# Patient Record
Sex: Female | Born: 1970
Health system: Southern US, Community
[De-identification: ages and names within clinical notes are randomized; demographics above are authoritative.]

## PROBLEM LIST (undated history)

## (undated) DIAGNOSIS — R87629 Unspecified abnormal cytological findings in specimens from vagina: Secondary | ICD-10-CM

## (undated) DIAGNOSIS — G40909 Epilepsy, unspecified, not intractable, without status epilepticus: Secondary | ICD-10-CM

## (undated) DIAGNOSIS — L0390 Cellulitis, unspecified: Secondary | ICD-10-CM

## (undated) DIAGNOSIS — F419 Anxiety disorder, unspecified: Secondary | ICD-10-CM

## (undated) DIAGNOSIS — F17200 Nicotine dependence, unspecified, uncomplicated: Secondary | ICD-10-CM

## (undated) DIAGNOSIS — Z720 Tobacco use: Secondary | ICD-10-CM

## (undated) DIAGNOSIS — IMO0002 Reserved for concepts with insufficient information to code with codable children: Secondary | ICD-10-CM

## (undated) DIAGNOSIS — O039 Complete or unspecified spontaneous abortion without complication: Secondary | ICD-10-CM

## (undated) DIAGNOSIS — J45909 Unspecified asthma, uncomplicated: Secondary | ICD-10-CM

## (undated) DIAGNOSIS — Z319 Encounter for procreative management, unspecified: Secondary | ICD-10-CM

## (undated) DIAGNOSIS — K5792 Diverticulitis of intestine, part unspecified, without perforation or abscess without bleeding: Secondary | ICD-10-CM

## (undated) DIAGNOSIS — Z8742 Personal history of other diseases of the female genital tract: Secondary | ICD-10-CM

## (undated) HISTORY — PX: CHOLECYSTECTOMY: SHX55

## (undated) HISTORY — PX: COLPOSCOPY: SHX161

## (undated) HISTORY — DX: Diverticulitis of intestine, part unspecified, without perforation or abscess without bleeding: K57.92

## (undated) HISTORY — DX: Tobacco use: Z72.0

## (undated) HISTORY — DX: Nicotine dependence, unspecified, uncomplicated: F17.200

## (undated) HISTORY — DX: Epilepsy, unspecified, not intractable, without status epilepticus: G40.909

## (undated) HISTORY — DX: Cellulitis, unspecified: L03.90

## (undated) HISTORY — DX: Reserved for concepts with insufficient information to code with codable children: IMO0002

## (undated) HISTORY — DX: Encounter for procreative management, unspecified: Z31.9

## (undated) HISTORY — DX: Complete or unspecified spontaneous abortion without complication: O03.9

## (undated) HISTORY — DX: Personal history of other diseases of the female genital tract: Z87.42

## (undated) HISTORY — DX: Unspecified abnormal cytological findings in specimens from vagina: R87.629

## (undated) HISTORY — DX: Anxiety disorder, unspecified: F41.9

## (undated) HISTORY — PX: BUNIONECTOMY: SHX129

## (undated) HISTORY — PX: TONSILLECTOMY AND ADENOIDECTOMY: SUR1326

---

## 1995-05-24 DIAGNOSIS — IMO0002 Reserved for concepts with insufficient information to code with codable children: Secondary | ICD-10-CM

## 1995-05-24 HISTORY — DX: Reserved for concepts with insufficient information to code with codable children: IMO0002

## 1997-09-15 ENCOUNTER — Other Ambulatory Visit: Admission: RE | Admit: 1997-09-15 | Discharge: 1997-09-15 | Payer: Self-pay | Admitting: Obstetrics and Gynecology

## 1998-01-06 ENCOUNTER — Other Ambulatory Visit: Admission: RE | Admit: 1998-01-06 | Discharge: 1998-01-06 | Payer: Self-pay | Admitting: Obstetrics and Gynecology

## 1998-05-23 HISTORY — PX: REFRACTIVE SURGERY: SHX103

## 1998-06-23 ENCOUNTER — Other Ambulatory Visit: Admission: RE | Admit: 1998-06-23 | Discharge: 1998-06-23 | Payer: Self-pay | Admitting: Obstetrics and Gynecology

## 1999-02-05 ENCOUNTER — Other Ambulatory Visit: Admission: RE | Admit: 1999-02-05 | Discharge: 1999-02-05 | Payer: Self-pay | Admitting: Gynecology

## 2000-07-18 ENCOUNTER — Other Ambulatory Visit: Admission: RE | Admit: 2000-07-18 | Discharge: 2000-07-18 | Payer: Self-pay | Admitting: Gynecology

## 2001-03-14 ENCOUNTER — Inpatient Hospital Stay (HOSPITAL_COMMUNITY): Admission: AD | Admit: 2001-03-14 | Discharge: 2001-03-14 | Payer: Self-pay | Admitting: Gynecology

## 2001-04-15 ENCOUNTER — Observation Stay (HOSPITAL_COMMUNITY): Admission: AD | Admit: 2001-04-15 | Discharge: 2001-04-16 | Payer: Self-pay | Admitting: *Deleted

## 2001-04-20 ENCOUNTER — Encounter (INDEPENDENT_AMBULATORY_CARE_PROVIDER_SITE_OTHER): Payer: Self-pay | Admitting: Specialist

## 2001-04-20 ENCOUNTER — Inpatient Hospital Stay (HOSPITAL_COMMUNITY): Admission: AD | Admit: 2001-04-20 | Discharge: 2001-04-23 | Payer: Self-pay | Admitting: Gynecology

## 2001-04-22 DIAGNOSIS — L0390 Cellulitis, unspecified: Secondary | ICD-10-CM

## 2001-04-22 HISTORY — DX: Cellulitis, unspecified: L03.90

## 2001-04-24 ENCOUNTER — Encounter: Admission: RE | Admit: 2001-04-24 | Discharge: 2001-05-24 | Payer: Self-pay | Admitting: Gynecology

## 2001-04-28 ENCOUNTER — Inpatient Hospital Stay (HOSPITAL_COMMUNITY): Admission: EM | Admit: 2001-04-28 | Discharge: 2001-04-30 | Payer: Self-pay | Admitting: Emergency Medicine

## 2001-05-01 ENCOUNTER — Emergency Department (HOSPITAL_COMMUNITY): Admission: EM | Admit: 2001-05-01 | Discharge: 2001-05-01 | Payer: Self-pay | Admitting: Emergency Medicine

## 2001-05-02 ENCOUNTER — Emergency Department (HOSPITAL_COMMUNITY): Admission: EM | Admit: 2001-05-02 | Discharge: 2001-05-02 | Payer: Self-pay | Admitting: Emergency Medicine

## 2001-05-28 ENCOUNTER — Other Ambulatory Visit: Admission: RE | Admit: 2001-05-28 | Discharge: 2001-05-28 | Payer: Self-pay | Admitting: Gynecology

## 2001-07-30 ENCOUNTER — Encounter: Payer: Self-pay | Admitting: Family Medicine

## 2001-07-30 ENCOUNTER — Ambulatory Visit (HOSPITAL_COMMUNITY): Admission: RE | Admit: 2001-07-30 | Discharge: 2001-07-30 | Payer: Self-pay | Admitting: Family Medicine

## 2002-07-22 HISTORY — PX: CERVICAL BIOPSY  W/ LOOP ELECTRODE EXCISION: SUR135

## 2002-07-23 ENCOUNTER — Other Ambulatory Visit: Admission: RE | Admit: 2002-07-23 | Discharge: 2002-07-23 | Payer: Self-pay | Admitting: Gynecology

## 2002-12-04 ENCOUNTER — Other Ambulatory Visit: Admission: RE | Admit: 2002-12-04 | Discharge: 2002-12-04 | Payer: Self-pay | Admitting: Gynecology

## 2002-12-05 ENCOUNTER — Encounter: Payer: Self-pay | Admitting: Internal Medicine

## 2002-12-05 ENCOUNTER — Ambulatory Visit (HOSPITAL_COMMUNITY): Admission: RE | Admit: 2002-12-05 | Discharge: 2002-12-05 | Payer: Self-pay | Admitting: Internal Medicine

## 2002-12-11 ENCOUNTER — Ambulatory Visit (HOSPITAL_COMMUNITY): Admission: RE | Admit: 2002-12-11 | Discharge: 2002-12-11 | Payer: Self-pay | Admitting: Orthopedic Surgery

## 2002-12-11 ENCOUNTER — Encounter: Payer: Self-pay | Admitting: Orthopedic Surgery

## 2003-03-20 ENCOUNTER — Other Ambulatory Visit: Admission: RE | Admit: 2003-03-20 | Discharge: 2003-03-20 | Payer: Self-pay | Admitting: Gynecology

## 2003-06-16 ENCOUNTER — Other Ambulatory Visit: Admission: RE | Admit: 2003-06-16 | Discharge: 2003-06-16 | Payer: Self-pay | Admitting: Gynecology

## 2003-07-07 ENCOUNTER — Ambulatory Visit (HOSPITAL_COMMUNITY): Admission: RE | Admit: 2003-07-07 | Discharge: 2003-07-07 | Payer: Self-pay | Admitting: Family Medicine

## 2003-09-04 ENCOUNTER — Other Ambulatory Visit: Admission: RE | Admit: 2003-09-04 | Discharge: 2003-09-04 | Payer: Self-pay | Admitting: Gynecology

## 2003-12-02 ENCOUNTER — Ambulatory Visit (HOSPITAL_COMMUNITY): Admission: RE | Admit: 2003-12-02 | Discharge: 2003-12-02 | Payer: Self-pay | Admitting: Urology

## 2004-01-01 ENCOUNTER — Ambulatory Visit (HOSPITAL_COMMUNITY): Admission: RE | Admit: 2004-01-01 | Discharge: 2004-01-01 | Payer: Self-pay | Admitting: Family Medicine

## 2004-05-25 ENCOUNTER — Other Ambulatory Visit: Admission: RE | Admit: 2004-05-25 | Discharge: 2004-05-25 | Payer: Self-pay | Admitting: Gynecology

## 2004-09-07 ENCOUNTER — Other Ambulatory Visit: Admission: RE | Admit: 2004-09-07 | Discharge: 2004-09-07 | Payer: Self-pay | Admitting: Gynecology

## 2004-10-06 ENCOUNTER — Ambulatory Visit: Payer: Self-pay | Admitting: Orthopedic Surgery

## 2004-11-25 ENCOUNTER — Ambulatory Visit: Payer: Self-pay | Admitting: Orthopedic Surgery

## 2005-08-01 ENCOUNTER — Ambulatory Visit: Payer: Self-pay | Admitting: Orthopedic Surgery

## 2005-09-09 ENCOUNTER — Other Ambulatory Visit: Admission: RE | Admit: 2005-09-09 | Discharge: 2005-09-09 | Payer: Self-pay | Admitting: Gynecology

## 2006-05-24 ENCOUNTER — Ambulatory Visit (HOSPITAL_COMMUNITY): Admission: RE | Admit: 2006-05-24 | Discharge: 2006-05-24 | Payer: Self-pay | Admitting: Family Medicine

## 2006-09-11 ENCOUNTER — Other Ambulatory Visit: Admission: RE | Admit: 2006-09-11 | Discharge: 2006-09-11 | Payer: Self-pay | Admitting: Gynecology

## 2007-08-03 ENCOUNTER — Ambulatory Visit: Payer: Self-pay | Admitting: Internal Medicine

## 2007-08-09 ENCOUNTER — Ambulatory Visit (HOSPITAL_COMMUNITY): Admission: RE | Admit: 2007-08-09 | Discharge: 2007-08-09 | Payer: Self-pay | Admitting: Internal Medicine

## 2007-09-14 ENCOUNTER — Other Ambulatory Visit: Admission: RE | Admit: 2007-09-14 | Discharge: 2007-09-14 | Payer: Self-pay | Admitting: Gynecology

## 2007-10-15 ENCOUNTER — Emergency Department (HOSPITAL_COMMUNITY): Admission: EM | Admit: 2007-10-15 | Discharge: 2007-10-15 | Payer: Self-pay | Admitting: Family Medicine

## 2008-03-21 ENCOUNTER — Ambulatory Visit: Payer: Self-pay | Admitting: Women's Health

## 2008-03-21 ENCOUNTER — Other Ambulatory Visit: Admission: RE | Admit: 2008-03-21 | Discharge: 2008-03-21 | Payer: Self-pay | Admitting: Gynecology

## 2008-03-21 ENCOUNTER — Encounter: Payer: Self-pay | Admitting: Women's Health

## 2008-03-26 ENCOUNTER — Ambulatory Visit (HOSPITAL_COMMUNITY): Admission: RE | Admit: 2008-03-26 | Discharge: 2008-03-26 | Payer: Self-pay | Admitting: Gynecology

## 2008-08-28 ENCOUNTER — Ambulatory Visit: Payer: Self-pay | Admitting: Gynecology

## 2008-09-15 ENCOUNTER — Encounter: Payer: Self-pay | Admitting: Women's Health

## 2008-09-15 ENCOUNTER — Ambulatory Visit: Payer: Self-pay | Admitting: Women's Health

## 2008-09-15 ENCOUNTER — Other Ambulatory Visit: Admission: RE | Admit: 2008-09-15 | Discharge: 2008-09-15 | Payer: Self-pay | Admitting: Gynecology

## 2008-09-19 ENCOUNTER — Ambulatory Visit: Payer: Self-pay | Admitting: Women's Health

## 2009-04-08 ENCOUNTER — Ambulatory Visit: Payer: Self-pay | Admitting: Women's Health

## 2009-09-21 ENCOUNTER — Ambulatory Visit: Payer: Self-pay | Admitting: Women's Health

## 2009-09-21 ENCOUNTER — Other Ambulatory Visit: Admission: RE | Admit: 2009-09-21 | Discharge: 2009-09-21 | Payer: Self-pay | Admitting: Gynecology

## 2009-10-22 ENCOUNTER — Ambulatory Visit: Payer: Self-pay | Admitting: Women's Health

## 2010-10-05 NOTE — H&P (Signed)
NAME:  Debbie Young, Debbie Young                 ACCOUNT NO.:  [id number]   MEDICAL RECORD NO.:  [id number]          PATIENT TYPE:  AMB   LOCATION:  DAY                           FACILITY:  [facility]   PHYSICIAN:  R. Roetta Sessions, M.D. DATE OF BIRTH:  06-22-1970   DATE OF ADMISSION:  DATE OF DISCHARGE:  LH                              HISTORY & PHYSICAL   CHIEF COMPLAINT:  I have a spot on my liver.   PRIMARY CARE PHYSICIAN:  Corrie Mckusick, M.D.   HISTORY OF PRESENT ILLNESS:  Debbie Young is a 40 year old lady who presents  today to follow up on a spot on the liver which she has known about  dating back to 2005.  She states she saw a urologist because of  microhematuria.  She had a renal ultrasound in their office and  apparently they picked up on a liver lesion.  Because of this, she  underwent a CT of abdomen and pelvis.  This was back on Sep 29, 2003.  She had a 1-cm low-density lesion in the right liver that was felt not  to be a cyst.  MRI was recommended.  She had an MRI of the abdomen December 02, 2003, which revealed two small hemangiomas of the right lobe of the  liver.  The better-defined, slightly larger one correlated with the  abnormality seen on prior CT.  One was 12 mm in the posterior lateral  aspect of the right lobe of the liver and the other 9 mm in the  midportion of the right lobe.  It was felt that no further workup was  needed.  She states, however, her urologist recommend that she have one  more study done later, but she fell to follow up.  In addition, in  August 2005 she had a CT here at [facility]. which showed these  two small lesions measuring 13 x 8 and 12 x 9 mm, corresponding to the  tiny hepatic hemangiomas previously seen on MRI.  She denies any  abdominal pain.  She often does have some postprandial loose stools when  she is not working.  Whenever she is on the job, she feels constipated  and bloated.  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  She denies any blood in the stool.  No nausea or  vomiting or heartburn.  No unintentional weight loss.   CURRENT MEDICATIONS:  1. Ibuprofen p.r.n.  2. NuvaRing.   ALLERGIES:  PREDNISONE.   PAST MEDICAL HISTORY:  1. History of recurrent sinusitis three or four times a year,      sometimes with bronchitis.  2. She had a juvenile polyp at age 73 found due to rectal bleeding.  3. She has had two bunionectomies, tonsillectomy, cholecystectomy and      cesarean section.   FAMILY HISTORY:  Her mother has some sort of platelet deficiency.  They  also feel she has von Willebrand disease, although according to the  patient it is not entirely confirmed.  Father has myelodysplasia  followed by [facility] for over 10 years.   SOCIAL HISTORY:  She is single.  She a 40-year-old.  She is employed with  [employer] as a Engineer, petroleum and has worked for 9 years  there.  She smokes less than a pack a day.  Denies frequent alcohol use,  usually drinks only socially.   REVIEW OF SYSTEMS:  GI:  See HPI.  CONSTITUTIONAL:  No unintentional  weight loss.  CARDIOPULMONARY:  No chest pain, shortness of breath.  GENITOURINARY:  No dysuria or hematuria.  Irregular menses.   PHYSICAL EXAM:  Weight 146, height 5 feet 4 inches, temperature 98,  blood pressure 110/68, pulse 56.  GENERAL:  A pleasant, well-nourished, well-developed Caucasian female in  no acute distress.  SKIN:  Warm and dry.  No jaundice.  HEENT: Sclerae are nonicteric.  Oropharyngeal mucosa moist and pink.  No  lesions, erythema or exudate.  No lymphadenopathy or thyromegaly.  CHEST:  Lungs clear auscultation.  CARDIAC:  Regular rate and rhythm.  Normal S1 and S2.  No murmurs, rubs  or gallops.  ABDOMEN: Positive bowel sounds.  Abdomen is soft, nontender,  nondistended.  No organomegaly or masses.  No rebound or guarding.  No  abdominal bruits or  hernias.  LOWER EXTREMITIES:  No edema.   IMPRESSION:  Debbie Young is a 40-year lady who presents for further follow-up  of known two hepatic hemangiomas.  She is quite concerned about having  one final follow-up study as it has been four years just to ensure that  there is no significant increase in size.  This is reasonable given that  her other studies were followed fairly close to each other.  In  addition, she has some postprandial loose stools with vague abdominal  bloating, most likely due to irritable bowel syndrome but will further  evaluate at time of CT as well.  CT of the abdomen and pelvis with IV  and oral contrast.  These lesions were seen on previous study.  I am  sure that they can be seen and documented as far as size; however, I  will discuss with radiology to see whether or not they feel this would  be an adequate study or whether she needs to actually have an MRI.  1. A trial of Align one daily.  Samples provided.      Tana Coast, P.AJonathon Bellows, M.D.  Electronically Signed    LL/MEDQ  D:  08/03/2007  T:  08/04/2007  Job:  [id number]   cc:   Corrie Mckusick, M.D.  Fax: [phone]

## 2010-10-08 NOTE — Consult Note (Signed)
[facility]  Patient:    Debbie Young, Debbie Young Visit Number: [id number] MRN: [id number]          Service Type: MED Location: 3W 0366 01 Attending Physician:  Anastasio Auerbach Dictated by:   Nadyne Coombes. Fontaine, M.D. Consultation Date: 04/28/01 Admit Date:  04/28/2001 Discharge Date: 04/30/2001                            Consultation Report  CHIEF COMPLAINT:              Swollen left foot.  HISTORY OF PRESENT ILLNESS:   A 40 year old female, status post cesarean section approximately eight days prior to evaluation, who noticed the morning of evaluation, a redness and swelling on her left foot.  This has progressively worsened throughout the day and she presents for evaluation. The patient was seen at a local physicians office in [place] and apparently was given an IM shot of Keflex and was sent to the hospital to rule out DVT.  Patient denies any injury to the extremity, any cuts, bug bites or other trauma.  Patient is, otherwise, having uneventful recovery from her cesarean section.  PAST SURGICAL HISTORY:        Significant for polypectomy, laser eye surgery, cesarean section for breech, bunionectomy, tonsilloadenoidectomy and cholecystectomy.  PAST MEDICAL HISTORY:         Unremarkable.  ALLERGIES:                    No known drug allergies.  CURRENT MEDICATIONS:          1. Tylox p.r.n. for pain.                               2. Recent Keflex administration today.  SOCIAL HISTORY:               Noncontributory.  FAMILY HISTORY:               Noncontributory.  REVIEW OF SYSTEMS:            Noncontributory.  PHYSICAL EXAMINATION:  VITAL SIGNS:                  Temperature 100.6 degrees.  Vital signs are stable.  HEENT:                        Normal.  LUNGS:                        Clear.  CARDIOVASCULAR:               Regular rate.  No rubs, murmurs or gallops.  ABDOMEN:                      Exam benign.  Incision healing  nicely.  EXTREMITIES:                  Marked swelling of the left foot with erythematous changes, warm to the touch.  There is no calf or thigh tenderness.  No swelling above the ankle.  No palpable cords or other abnormalities.  ASSESSMENT:                   Probable cellulitis, rule out deep vein thrombosis.  Although, given the marked erythema and heat with  discomfort, certainly all points towards an infectious etiology.  Cause is unknown as there are no skin breaks or other overt etiologies for this cellulitis.  I discussed the case with Dr. Beverely Pace at [facility] emergency room and reviewed the situation with him.  I do not think this related to her cesarean section or having any gynecologic or obstetric etiology.  Given the rapid progression of the apparent cellulitis, I think that she needs to be evaluated and considered for IV antibiotic therapy and certainly the potential for rule out DVT studies and I have asked Dr. Beverely Pace to evaluate her at [facility] emergency room and triage per his evaluation and he agrees to accept her and we will transport her there for evaluation. Dictated by:   Nadyne Coombes. Fontaine, M.D. Attending Physician:  Anastasio Auerbach DD:  04/29/01 TD:  04/29/01 Job: [id number]

## 2010-10-08 NOTE — H&P (Signed)
[facility]  Patient:    Debbie Young, Debbie Young Visit Number: [id number] MRN: [id number]          Service Type: OBS Location: 910A 9130 01 Attending Physician:  Tonye Royalty Dictated by:   Gaetano Hawthorne. Lily Peer, M.D. Admit Date:  04/20/2001                           History and Physical  CHIEF COMPLAINT:              Leakage of amniotic fluid per vagina.  HISTORY OF PRESENT ILLNESS:   The patient is a 40 year old, gravida 1, para 0, with corrected date of estimated confinement May 16, 2001.  Currently 36-2/[redacted] weeks gestation with known breach presentation, has been on oral tocolytics consisting of terbutaline 5 mg p.o. q.4h. secondary to preterm labor which was diagnosed one week ago requiring overnight hospitalization. The patients last terbutaline was approximately 1600 hours today, and she presented to [facility] today complaining of feeling wet with leakage of fluid per vagina since early this morning.  When she arrived, she was examined with vital signs as follows. Temperature 98.6, blood pressure 112/69, pulse 117, respirations 20.  Nitrazine and ferning were negative.  Her cervix was found to be 4 cm dilated, 80% effaced.  Bedside ultrasound confirmed frank breach presentation.  Fetal heart rate tracing was reassuring at 150 beats per minute and very mild and infrequent contractions, mostly irritability.  Based on findings of advanced cervical dilatation with breach presentation, it was decided to proceed at this point with primary cesarean section due to patient discomfort with lower abdominal pressure which she had been having all day.  PAST MEDICAL HISTORY:         Preterm labor diagnosed one week ago on terbutaline 5 mg p.o. q.4h., last dose 1600 hours.  She has a history of asthma and bronchitis, last episode back in 1996.  ALLERGIES:                    The patient denies any allergies.  PAST SURGICAL HISTORY:         Tonsillectomy and adenoidectomy.  She has had a polyp removed in 1980, two bunionectomies, cholecystectomy in 1996, refractive surgery in 2000.  REVIEW OF SYSTEMS:            See hospital form.  PHYSICAL EXAMINATION:  VITAL SIGNS:                  Blood pressure 112/69, temperature 98.6, pulse 117, respirations 20.  HEENT:                        Unremarkable.  NECK:                         Supple. Trachea midline.  No carotid bruits.  No thyromegaly.  LUNGS:                        Clear to auscultation without rhonchi or wheezes.  HEART:                        Regular rate and rhythm, no murmurs or gallops.  BREASTS:                      Exam not done.  ABDOMEN:                      Gravid uterus, breach presentation by [facility] maneuver as confirmed also by bedside ultrasound.  Positive fetal heart tones were noted.  PELVIC:                       Cervix 4 cm dilated, 80% effaced, breach presentation.  Questionable rupture of membranes although mostly subjective and not objective.  EXTREMITIES:                  DTRs 1+.  Negative clonus.  Trace edema.  PRENATAL LABORATORY DATA:     A positive blood type, negative antibody screen. VDRL was nonreactive.  Rubella immune.  Hepatitis B surface antigen and HIV were negative.  Alpha-fetoprotein was normal.  The patient had GBS culture done last week, results pending at the time of this dictation.  Pap smear was normal.  ASSESSMENT:                   A 40 year old gravida 1, para 0, at 37-2/[redacted] weeks gestation with advanced cervical dilatation at 4 cm, 80% effaced, -3 station with questionable rupture of membranes subjectively but not objectively.  The patient has breach presentation was scheduled for cesarean section in December.  Due to advanced cervical dilatation, lower abdominal pressure, and pelvic pressure she has complained of, and being in the breach presentation, it was decided to proceed at this point at 36-2/[redacted] weeks  gestation with primary cesarean section.  Of note, the patients blood sugar screens were reported to be normal.  Risks, benefits, and procedure were discussed with the patient including infection, bleeding, trauma to internal organs, trauma to the fetus, in the event of uncontrollable hemorrhage, potential risk for blood transfusion with potential risk such as anaphylactic reaction, hepatitis, and HIV were discussed.  All questions were answered and will follow accordingly.  PLAN:                         The patient will be prepared for emergency cesarean section. Dictated by:   Gaetano Hawthorne Lily Peer, M.D. Attending Physician:  Tonye Royalty DD:  04/20/01 TD:  04/20/01 Job: [id number]

## 2010-10-08 NOTE — Op Note (Signed)
[facility]  Patient:    Debbie Young, Debbie Young Visit Number: [id number] MRN: [id number]          Service Type: OBS Location: 910A 9130 01 Attending Physician:  Tonye Royalty Dictated by:   Gaetano Hawthorne. Lily Peer, M.D. Proc. Date: 04/20/01 Admit Date:  04/20/2001                             Operative Report  SURGEON:                      Juan H. Lily Peer, M.D.  INDICATION FOR OPERATION:     A 40 year old, gravida 1, para 0, at 36-2/[redacted] weeks gestation with advanced cervical dilation, 4 cm, 80% effaced, -2 to -3 station; subjective ruptured membranes; objectively, nondetected by fern or Nitrazine. History of preterm labor on terbutaline 5.0 mg q.4-6h.  PREOPERATIVE DIAGNOSES:       1. Preterm, 36-2/7 weeks estimated gestational                                  age.                               2. Homero Fellers breech presentation.                               3. Advanced cervical dilatation.                               4. Questionable ruptured membranes.  POSTOPERATIVE DIAGNOSES:      1. Preterm, 36-2/7 weeks estimated gestational                                  age.                               2. Homero Fellers breech presentation.                               3. Advanced cervical dilatation.                               4. Questionable ruptured membranes.                               5. Nuchal cord.  ANESTHESIA:                   Spinal.  PROCEDURE:                    Primary lower uterine segment transverse                               cesarean section.  ESTIMATED BLOOD LOSS:         800 cc.  FINDINGS:  Viable female infant with Apgars of 8 and 9 in the frank breech presentation; clear amniotic fluid; weight 7 pounds. Arterial cord pH of 7.30. Nuchal cord x 1.  DESCRIPTION OF PROCEDURE:     After the patient was adequately counseled and after confirming on bedside ultrasound that the fetus indeed was in the  breech presentation, we proceeded on with an emergency primary cesarean section. The patient had been counseled as to the risk, benefits, pros and cons, of such procedure. After the spinal was achieved, the patient was in the supine position. The abdomen was prepped and draped in the usual sterile fashion. A Foley catheter was inserted in an effort to monitor urinary output. A Pfannenstiel skin incision was made 2 cm above the symphysis pubis. The incision was carried down from the skin, subcutaneous tissue, down through the rectus fascia. The rectus fascia was incised in a transverse fashion. The midline raphe was entered. The peritoneal cavity was entered cautiously. The bladder flap was established. The lower uterine segment was incised in a transverse fashion. Clear amniotic fluid was present. The fetus was delivered in the frank breech presentation, the lower extremities, followed by the upper extremities. A nuchal cord was evident which was manually reduced. After the newborn was delivered, the nasopharyngeal area was bulb suctioned. The cord was doubly clamped and excised and the infant passed off to the pediatricians who were in attendance. The baby gave an immediate cry. After cord blood was obtained, the placenta was delivered from the intrauterine cavity. The uterus was then exteriorized. The intrauterine cavity was felt clear of remaining products of conception. The lower uterine segment was closed in a single layer fashion interlocking stitch manner with 0 Vicryl suture. The tubes and ovaries were normal. The uterus was placed back into the abdominal cavity. The pelvic cavity was copiously irrigated with normal saline solution. After ascertaining adequate hemostasis, the visceral peritoneum was not approximated but the rectus fascia was closed with a running stitch of 0 Vicryl suture. The subcutaneous bleeders were Bovie cauterized. The skin was reapproximated with skin clips  followed by Xeroform gauze and 4 x 4 dressing. The patient was transferred to recovery room with stable vital signs. Blood loss for the procedure was reported at 800 cc. IV fluids 2000 cc lactated Ringers. Urine output 50 cc. The patient received a gram of Cefotan IV, and no real complications. Dictated by:   Gaetano Hawthorne Lily Peer, M.D. Attending Physician:  Tonye Royalty DD:  04/20/01 TD:  04/21/01 Job: [id number]

## 2010-10-08 NOTE — Discharge Summary (Signed)
[facility]  Patient:    Debbie Young, Debbie Young Visit Number: [id number] MRN: [id number]          Service Type: EMS Location: ED Attending Physician:  Annamarie Dawley Dictated by:   Antony Contras, [facility] Admit Date:  05/02/2001 Discharge Date: 05/02/2001                             Discharge Summary  DISCHARGE DIAGNOSES:          1. Preterm, 36-2/[redacted] weeks gestational age.                               2. Homero Fellers breech presentation.                               3. Advanced cervical dilatation.                               4. Questionable rupture of membranes.                               5. Nuchal cord.  HISTORY OF PRESENT ILLNESS:   The patient is a 40 year old primigravida with an LMP of July 04, 2000, [facility] May 16, 2001 by sonogram.  Prenatal course was complicated by preterm labor requiring administration of terbutaline 5 mg p.o. q.4h. at approximately [redacted] weeks gestation.  LABORATORY/ACCESSORY DATA:    Blood type A-positive.  Antibody screen negative.  RPR, HBsAg, HIV nonreactive.  MSAFP normal.  HOSPITAL COURSE/TREATMENT:    The patient presented on April 20, 2001 with advanced cervical dilatation of 4 cm, 80% effaced, -2 station, subjective rupture of membranes, breech presentation.  It was decided to proceed with cesarean section.  This was performed by Dr. Lily Peer under spinal anesthesia.  Findings included a viable female infant, Apgars 8/9, weight 7 pounds, clear amniotic fluid.  Infant frank breech presentation.  Arterial cord pH 7.30, nuchal cord x1.  POSTOPERATIVE COURSE:         The patient remained afebrile, had no difficulty voiding, was able to be discharged in satisfactory condition on her third postoperative day.  CBC: Hematocrit 26.9, hemoglobin 9.3, WBC 10.7, platelets 223,000.  DISPOSITION:                  Follow up in six weeks.  Continue prenatal vitamins and iron.  Motrin, Tylox for pain. Dictated by:   Antony Contras, [facility] Attending Physician:  Annamarie Dawley DD:  06/01/01 TD:  06/02/01 Job: [id number]

## 2011-02-21 DIAGNOSIS — G40909 Epilepsy, unspecified, not intractable, without status epilepticus: Secondary | ICD-10-CM | POA: Insufficient documentation

## 2011-02-21 DIAGNOSIS — IMO0002 Reserved for concepts with insufficient information to code with codable children: Secondary | ICD-10-CM | POA: Insufficient documentation

## 2011-02-21 DIAGNOSIS — L0390 Cellulitis, unspecified: Secondary | ICD-10-CM | POA: Insufficient documentation

## 2011-02-21 DIAGNOSIS — F17200 Nicotine dependence, unspecified, uncomplicated: Secondary | ICD-10-CM | POA: Insufficient documentation

## 2011-02-21 DIAGNOSIS — R319 Hematuria, unspecified: Secondary | ICD-10-CM | POA: Insufficient documentation

## 2011-02-23 ENCOUNTER — Encounter: Payer: Self-pay | Admitting: Women's Health

## 2011-02-23 ENCOUNTER — Ambulatory Visit (INDEPENDENT_AMBULATORY_CARE_PROVIDER_SITE_OTHER): Payer: 59 | Admitting: Women's Health

## 2011-02-23 ENCOUNTER — Other Ambulatory Visit (HOSPITAL_COMMUNITY)
Admission: RE | Admit: 2011-02-23 | Discharge: 2011-02-23 | Disposition: A | Discharge status: Home / Self Care | Payer: 59 | Source: Ambulatory Visit | Attending: Gynecology | Admitting: Gynecology

## 2011-02-23 VITALS — BP 112/70 | Ht 64.0 in | Wt 178.0 lb

## 2011-02-23 DIAGNOSIS — Z13220 Encounter for screening for lipoid disorders: Secondary | ICD-10-CM

## 2011-02-23 DIAGNOSIS — Z01419 Encounter for gynecological examination (general) (routine) without abnormal findings: Secondary | ICD-10-CM

## 2011-02-23 DIAGNOSIS — R823 Hemoglobinuria: Secondary | ICD-10-CM

## 2011-02-23 NOTE — Progress Notes (Signed)
Mykela Stanford Breed 1970/12/22 [id number]    History:    The patient presents for annual exam.  Son Clifton Custard 9 doing well. Both parents with cancer father having chemo for Myeloma. Mother with breast cancer and doing better.   Past medical history, past surgical history, family history and social history were all reviewed and documented in the EPIC chart.   ROS:  A  ROS was performed and pertinent positives and negatives are included in the history.  Exam:  Filed Vitals:   02/23/11 0813  BP: 112/70    General appearance:  Normal Head/Neck:  Normal, without cervical or supraclavicular adenopathy. Thyroid:  Symmetrical, normal in size, without palpable masses or nodularity. Respiratory  Effort:  Normal  Auscultation:  Clear without wheezing or rhonchi Cardiovascular  Auscultation:  Regular rate, without rubs, murmurs or gallops  Edema/varicosities:  Not grossly evident Abdominal  Soft,nontender, without masses, guarding or rebound.  Liver/spleen:  No organomegaly noted  Hernia:  None appreciated  Skin  Inspection:  Grossly normal  Palpation:  Grossly normal Neurologic/psychiatric  Orientation:  Normal with appropriate conversation.  Mood/affect:  Normal  Genitourinary    Breasts: Examined lying and sitting.     Right: Without masses, retractions, discharge or axillary adenopathy.     Left: Without masses, retractions, discharge or axillary adenopathy.   Inguinal/mons:  Normal without inguinal adenopathy  External genitalia:  Normal  BUS/Urethra/Skene's glands:  Normal  Bladder:  Normal  Vagina:  Normal  Cervix:  Normal  Uterus:  normal in size, shape and contour.  Midline and mobile  Adnexa/parametria:     Rt: Without masses or tenderness.   Lt: Without masses or tenderness.  Anus and perineum: Normal  Digital rectal exam: Normal sphincter tone without palpated masses or tenderness  Assessment/Plan:  40 y.o.DWF G1P1  for annual exam monthly 4-5 day cycle, not sexually  active. No complaints.   Normal GYN exam History of HGSIL./CIN 2 in 04/LEEP/ normal Paps since.  Plan: SBEs, increase exercise, calcium rich diet, MVI daily encouraged. CBC, UA, lipid profile, and Pap. Contraception options reviewed she is down to 2-3 cigarettes per day and did review no combination OCs. She had problems with a Mirena IUD with spotting. Condoms if becomes active.   Harrington Challenger [facility], 8:42 AM 02/23/2011

## 2011-03-11 ENCOUNTER — Telehealth: Payer: Self-pay | Admitting: *Deleted

## 2011-03-11 NOTE — Telephone Encounter (Signed)
Telephone call, congratulated on quitting smoking for one week. Did review needs to be smoke-free a minimum of one month prior to starting back on birth control pills. States she liked being on pills to help with cycle control, lighter cycles. She is not sexually active. Did review risks of blood clots and strokes with smoking and being greater than 35.

## 2011-03-11 NOTE — Telephone Encounter (Signed)
Patient wanted to let you know that she has now completely stopped smoking.  Has been cigarette free for a week now.  Wants to get on oc's now that she has quit.  Was discussed at last visit on 02/23/11.  Please advise.

## 2011-04-07 ENCOUNTER — Telehealth: Payer: Self-pay

## 2011-04-07 MED ORDER — NORETHINDRONE ACET-ETHINYL EST 1-20 MG-MCG PO TABS: 1.0000 | ORAL_TABLET | Freq: Every day | ORAL | Status: DC

## 2011-04-07 NOTE — Telephone Encounter (Signed)
PT. NOTIFIED OF NANCY'S NOTE BELOW & SENT RX TO HER [facility]

## 2011-04-07 NOTE — Telephone Encounter (Signed)
Please call  congratulate for no smoking. Loestrin 1/20, one by mouth daily dispense one pack 11 refills generic. Instruct Debbie Young to start on first day of Debbie Young next cycle and to use condoms first month back on. Review with Debbie Young if she starts smoking again will need to stop pills due to risks of blood clots and strokes.(Was down to 2-3 cigarettes per day at annual exam)

## 2011-04-07 NOTE — Telephone Encounter (Signed)
PER PREVIOUS ENCOUNTERS IN EPIC PT STATES SHE HAS BEEN SMOKE FREE x 1 MONTH & WANTS TO START OCP'S AGAIN.

## 2012-02-26 ENCOUNTER — Other Ambulatory Visit: Payer: Self-pay | Admitting: Women's Health

## 2012-04-05 ENCOUNTER — Other Ambulatory Visit: Payer: Self-pay | Admitting: Women's Health

## 2012-04-11 ENCOUNTER — Encounter: Payer: Self-pay | Admitting: Women's Health

## 2012-04-11 ENCOUNTER — Ambulatory Visit (INDEPENDENT_AMBULATORY_CARE_PROVIDER_SITE_OTHER): Payer: 59 | Admitting: Women's Health

## 2012-04-11 VITALS — BP 116/80 | Ht 64.0 in | Wt 162.0 lb

## 2012-04-11 DIAGNOSIS — IMO0002 Reserved for concepts with insufficient information to code with codable children: Secondary | ICD-10-CM

## 2012-04-11 DIAGNOSIS — IMO0001 Reserved for inherently not codable concepts without codable children: Secondary | ICD-10-CM

## 2012-04-11 DIAGNOSIS — R6889 Other general symptoms and signs: Secondary | ICD-10-CM

## 2012-04-11 DIAGNOSIS — Z309 Encounter for contraceptive management, unspecified: Secondary | ICD-10-CM

## 2012-04-11 DIAGNOSIS — Z01419 Encounter for gynecological examination (general) (routine) without abnormal findings: Secondary | ICD-10-CM

## 2012-04-11 DIAGNOSIS — Z833 Family history of diabetes mellitus: Secondary | ICD-10-CM

## 2012-04-11 DIAGNOSIS — Z113 Encounter for screening for infections with a predominantly sexual mode of transmission: Secondary | ICD-10-CM

## 2012-04-11 DIAGNOSIS — F411 Generalized anxiety disorder: Secondary | ICD-10-CM

## 2012-04-11 DIAGNOSIS — F419 Anxiety disorder, unspecified: Secondary | ICD-10-CM

## 2012-04-11 LAB — CBC WITH DIFFERENTIAL/PLATELET
Basophils Relative: 1 % (ref 0–1)
Eosinophils Absolute: 0.1 10*3/uL (ref 0.0–0.7)
Eosinophils Relative: 1 % (ref 0–5)
Lymphs Abs: 2.4 10*3/uL (ref 0.7–4.0)
Monocytes Absolute: 0.4 10*3/uL (ref 0.1–1.0)
Monocytes Relative: 6 % (ref 3–12)
Neutrophils Relative %: 55 % (ref 43–77)
Platelets: 245 10*3/uL (ref 150–400)
WBC: 6.6 10*3/uL (ref 4.0–10.5)

## 2012-04-11 MED ORDER — ALPRAZOLAM 0.25 MG PO TABS: 0.2500 mg | ORAL_TABLET | Freq: Every evening | ORAL | Status: DC | PRN

## 2012-04-11 MED ORDER — NORETHINDRONE ACET-ETHINYL EST 1-20 MG-MCG PO TABS: 1.0000 | ORAL_TABLET | Freq: Every day | ORAL | Status: DC

## 2012-04-11 NOTE — Progress Notes (Addendum)
Debbie Young [birth year] [id number]    History:    The patient presents for annual exam.  Light monthly cycle on Loestrin 1/20. History HGSIL, LEEP 2004 for CIN-3, margins free,  normal Paps after. New partner. Normal screening mammogram at 41.   Past medical history, past surgical history, family history and social history were all reviewed and documented in the EPIC chart. Works in Consulting civil engineer at [employer].  Clifton Custard, 12 doing well. Father multiple myeloma, age 60/ hospice. Mother with history of breast cancer age 48, mastectomy,  BRCA unknown, doing well.  ROS:  A  ROS was performed and pertinent positives and negatives are included in the history.  Exam:  Filed Vitals:   04/11/12 0848  BP: 116/80    General appearance:  Normal Head/Neck:  Normal, without cervical or supraclavicular adenopathy. Thyroid:  Symmetrical, normal in size, without palpable masses or nodularity. Respiratory  Effort:  Normal  Auscultation:  Clear without wheezing or rhonchi Cardiovascular  Auscultation:  Regular rate, without rubs, murmurs or gallops  Edema/varicosities:  Not grossly evident Abdominal  Soft,nontender, without masses, guarding or rebound.  Liver/spleen:  No organomegaly noted  Hernia:  None appreciated  Skin  Inspection:  Grossly normal  Palpation:  Grossly normal Neurologic/psychiatric  Orientation:  Normal with appropriate conversation.  Mood/affect:  Normal  Genitourinary    Breasts: Examined lying and sitting.     Right: Without masses, retractions, discharge or axillary adenopathy.     Left: Without masses, retractions, discharge or axillary adenopathy.   Inguinal/mons:  Normal without inguinal adenopathy  External genitalia:  Normal  BUS/Urethra/Skene's glands:  Normal  Bladder:  Normal  Vagina:  Normal  Cervix:  Normal  Uterus:   normal in size, shape and contour.  Midline and mobile  Adnexa/parametria:     Rt: Without masses or tenderness.   Lt: Without masses or tenderness.  Anus  and perineum: Normal  Digital rectal exam: Normal sphincter tone without palpated masses or tenderness  Assessment/Plan:  41 y.o. DW F. G1 P1 for annual exam.  STD screen/ Loestrin 1/20 History of CIN 3/LEEP 2004/margins free-normal Paps after Situational stress-father multiple myeloma end-stage/hospice History of benign hematuria -negative work up by urologist  Plan: Loestrin 1/20 prescription, proper use, slight risk for blood clots and strokes reviewed. SBE's, exercise, calcium rich diet, MVI daily encouraged. CBC, glucose, UA, GC/Chlamydia, declines HIV, hepatitis or RPR. Pap normal 2012, new screening guidelines reviewed. Xanax 0.25 at bedtime when necessary for situational stress. Reviewed addictive properties to use sparingly.     Harrington Challenger [facility], 9:35 AM 04/11/2012

## 2012-04-11 NOTE — Patient Instructions (Addendum)

## 2012-04-11 NOTE — Assessment & Plan Note (Signed)
IUD removed 10/2009

## 2012-04-12 LAB — URINALYSIS W MICROSCOPIC + REFLEX CULTURE
Bilirubin Urine: NEGATIVE
Casts: NONE SEEN
Leukocytes, UA: NEGATIVE
Nitrite: NEGATIVE
Specific Gravity, Urine: 1.025 (ref 1.005–1.030)
Urobilinogen, UA: 0.2 mg/dL (ref 0.0–1.0)
pH: 5.5 (ref 5.0–8.0)

## 2012-04-12 LAB — GC/CHLAMYDIA PROBE AMP, GENITAL
Chlamydia, DNA Probe: NEGATIVE
GC Probe Amp, Genital: NEGATIVE

## 2012-04-13 LAB — URINE CULTURE: Organism ID, Bacteria: NO GROWTH

## 2012-07-20 ENCOUNTER — Other Ambulatory Visit: Payer: Self-pay | Admitting: Gynecology

## 2012-07-20 DIAGNOSIS — Z139 Encounter for screening, unspecified: Secondary | ICD-10-CM

## 2012-08-27 ENCOUNTER — Ambulatory Visit (HOSPITAL_COMMUNITY)
Admission: RE | Admit: 2012-08-27 | Discharge: 2012-08-27 | Disposition: A | Discharge status: Home / Self Care | Payer: 59 | Source: Ambulatory Visit | Attending: Gynecology | Admitting: Gynecology

## 2012-08-27 DIAGNOSIS — Z139 Encounter for screening, unspecified: Secondary | ICD-10-CM

## 2012-08-27 DIAGNOSIS — Z1231 Encounter for screening mammogram for malignant neoplasm of breast: Secondary | ICD-10-CM | POA: Insufficient documentation

## 2013-04-16 ENCOUNTER — Ambulatory Visit (INDEPENDENT_AMBULATORY_CARE_PROVIDER_SITE_OTHER): Payer: 59 | Admitting: Women's Health

## 2013-04-16 ENCOUNTER — Other Ambulatory Visit: Payer: Self-pay | Admitting: Women's Health

## 2013-04-16 ENCOUNTER — Other Ambulatory Visit (HOSPITAL_COMMUNITY)
Admission: RE | Admit: 2013-04-16 | Discharge: 2013-04-16 | Disposition: A | Discharge status: Home / Self Care | Payer: 59 | Source: Ambulatory Visit | Attending: Women's Health | Admitting: Women's Health

## 2013-04-16 ENCOUNTER — Encounter: Payer: Self-pay | Admitting: Women's Health

## 2013-04-16 VITALS — BP 114/74 | Ht 64.25 in | Wt 181.6 lb

## 2013-04-16 DIAGNOSIS — Z1151 Encounter for screening for human papillomavirus (HPV): Secondary | ICD-10-CM | POA: Insufficient documentation

## 2013-04-16 DIAGNOSIS — E079 Disorder of thyroid, unspecified: Secondary | ICD-10-CM

## 2013-04-16 DIAGNOSIS — Z833 Family history of diabetes mellitus: Secondary | ICD-10-CM

## 2013-04-16 DIAGNOSIS — F17200 Nicotine dependence, unspecified, uncomplicated: Secondary | ICD-10-CM

## 2013-04-16 DIAGNOSIS — Z01419 Encounter for gynecological examination (general) (routine) without abnormal findings: Secondary | ICD-10-CM

## 2013-04-16 DIAGNOSIS — Z13220 Encounter for screening for lipoid disorders: Secondary | ICD-10-CM

## 2013-04-16 DIAGNOSIS — IMO0001 Reserved for inherently not codable concepts without codable children: Secondary | ICD-10-CM

## 2013-04-16 DIAGNOSIS — Z309 Encounter for contraceptive management, unspecified: Secondary | ICD-10-CM

## 2013-04-16 DIAGNOSIS — Z124 Encounter for screening for malignant neoplasm of cervix: Secondary | ICD-10-CM | POA: Insufficient documentation

## 2013-04-16 LAB — CBC WITH DIFFERENTIAL/PLATELET
Basophils Relative: 0 % (ref 0–1)
Eosinophils Relative: 4 % (ref 0–5)
Lymphs Abs: 2.1 10*3/uL (ref 0.7–4.0)
MCH: 30.4 pg (ref 26.0–34.0)
RDW: 12.7 % (ref 11.5–15.5)

## 2013-04-16 LAB — URINALYSIS W MICROSCOPIC + REFLEX CULTURE
Bacteria, UA: NONE SEEN
Bilirubin Urine: NEGATIVE
Casts: NONE SEEN
Ketones, ur: NEGATIVE mg/dL
Leukocytes, UA: NEGATIVE
Protein, ur: NEGATIVE mg/dL
Specific Gravity, Urine: 1.017 (ref 1.005–1.030)
pH: 5.5 (ref 5.0–8.0)

## 2013-04-16 LAB — TSH: TSH: 1.86 u[IU]/mL (ref 0.350–4.500)

## 2013-04-16 LAB — GLUCOSE, RANDOM: Glucose, Bld: 77 mg/dL (ref 70–99)

## 2013-04-16 LAB — LIPID PANEL
Cholesterol: 152 mg/dL (ref 0–200)
Total CHOL/HDL Ratio: 2.7 Ratio
Triglycerides: 127 mg/dL (ref <150)
VLDL: 25 mg/dL (ref 0–40)

## 2013-04-16 MED ORDER — NORETHINDRONE ACET-ETHINYL EST 1-20 MG-MCG PO TABS: 1.0000 | ORAL_TABLET | Freq: Every day | ORAL | Status: DC

## 2013-04-16 NOTE — Addendum Note (Signed)
Addended by: Richardson Chiquito on: 04/16/2013 09:41 AM   Modules accepted: Orders

## 2013-04-16 NOTE — Patient Instructions (Signed)
BRCA testing Health Maintenance, Female A healthy lifestyle and preventative care can promote health and wellness.  Maintain regular health, dental, and eye exams.  Eat a healthy diet. Foods like vegetables, fruits, whole grains, low-fat dairy products, and lean protein foods contain the nutrients you need without too many calories. Decrease your intake of foods high in solid fats, added sugars, and salt. Get information about a proper diet from your caregiver, if necessary.  Regular physical exercise is one of the most important things you can do for your health. Most adults should get at least 150 minutes of moderate-intensity exercise (any activity that increases your heart rate and causes you to sweat) each week. In addition, most adults need muscle-strengthening exercises on 2 or more days a week.   Maintain a healthy weight. The body mass index (BMI) is a screening tool to identify possible weight problems. It provides an estimate of body fat based on height and weight. Your caregiver can help determine your BMI, and can help you achieve or maintain a healthy weight. For adults 20 years and older:  A BMI below 18.5 is considered underweight.  A BMI of 18.5 to 24.9 is normal.  A BMI of 25 to 29.9 is considered overweight.  A BMI of 30 and above is considered obese.  Maintain normal blood lipids and cholesterol by exercising and minimizing your intake of saturated fat. Eat a balanced diet with plenty of fruits and vegetables. Blood tests for lipids and cholesterol should begin at age 20 and be repeated every 5 years. If your lipid or cholesterol levels are high, you are over 50, or you are a high risk for heart disease, you may need your cholesterol levels checked more frequently.Ongoing high lipid and cholesterol levels should be treated with medicines if diet and exercise are not effective.  If you smoke, find out from your caregiver how to quit. If you do not use tobacco, do not  start.  Lung cancer screening is recommended for adults aged 55 80 years who are at high risk for developing lung cancer because of a history of smoking. Yearly low-dose computed tomography (CT) is recommended for people who have at least a 30-pack-year history of smoking and are a current smoker or have quit within the past 15 years. A pack year of smoking is smoking an average of 1 pack of cigarettes a day for 1 year (for example: 1 pack a day for 30 years or 2 packs a day for 15 years). Yearly screening should continue until the smoker has stopped smoking for at least 15 years. Yearly screening should also be stopped for people who develop a health problem that would prevent them from having lung cancer treatment.  If you are pregnant, do not drink alcohol. If you are breastfeeding, be very cautious about drinking alcohol. If you are not pregnant and choose to drink alcohol, do not exceed 1 drink per day. One drink is considered to be 12 ounces (355 mL) of beer, 5 ounces (148 mL) of wine, or 1.5 ounces (44 mL) of liquor.  Avoid use of street drugs. Do not share needles with anyone. Ask for help if you need support or instructions about stopping the use of drugs.  High blood pressure causes heart disease and increases the risk of stroke. Blood pressure should be checked at least every 1 to 2 years. Ongoing high blood pressure should be treated with medicines, if weight loss and exercise are not effective.  If you are   55 to 40 years old, ask your caregiver if you should take aspirin to prevent strokes.  Diabetes screening involves taking a blood sample to check your fasting blood sugar level. This should be done once every 3 years, after age 45, if you are within normal weight and without risk factors for diabetes. Testing should be considered at a younger age or be carried out more frequently if you are overweight and have at least 1 risk factor for diabetes.  Breast cancer screening is essential  preventative care for women. You should practice "breast self-awareness." This means understanding the normal appearance and feel of your breasts and may include breast self-examination. Any changes detected, no matter how small, should be reported to a caregiver. Women in their 20s and 30s should have a clinical breast exam (CBE) by a caregiver as part of a regular health exam every 1 to 3 years. After age 40, women should have a CBE every year. Starting at age 40, women should consider having a mammogram (breast X-ray) every year. Women who have a family history of breast cancer should talk to their caregiver about genetic screening. Women at a high risk of breast cancer should talk to their caregiver about having an MRI and a mammogram every year.  Breast cancer gene (BRCA)-related cancer risk assessment is recommended for women who have family members with BRCA-related cancers. BRCA-related cancers include breast, ovarian, tubal, and peritoneal cancers. Having family members with these cancers may be associated with an increased risk for harmful changes (mutations) in the breast cancer genes BRCA1 and BRCA2. Results of the assessment will determine the need for genetic counseling and BRCA1 and BRCA2 testing.  The Pap test is a screening test for cervical cancer. Women should have a Pap test starting at age 21. Between ages 21 and 29, Pap tests should be repeated every 2 years. Beginning at age 30, you should have a Pap test every 3 years as long as the past 3 Pap tests have been normal. If you had a hysterectomy for a problem that was not cancer or a condition that could lead to cancer, then you no longer need Pap tests. If you are between ages 65 and 70, and you have had normal Pap tests going back 10 years, you no longer need Pap tests. If you have had past treatment for cervical cancer or a condition that could lead to cancer, you need Pap tests and screening for cancer for at least 20 years after your  treatment. If Pap tests have been discontinued, risk factors (such as a new sexual partner) need to be reassessed to determine if screening should be resumed. Some women have medical problems that increase the chance of getting cervical cancer. In these cases, your caregiver may recommend more frequent screening and Pap tests.  The human papillomavirus (HPV) test is an additional test that may be used for cervical cancer screening. The HPV test looks for the virus that can cause the cell changes on the cervix. The cells collected during the Pap test can be tested for HPV. The HPV test could be used to screen women aged 30 years and older, and should be used in women of any age who have unclear Pap test results. After the age of 30, women should have HPV testing at the same frequency as a Pap test.  Colorectal cancer can be detected and often prevented. Most routine colorectal cancer screening begins at the age of 50 and continues through age 75. However,   your caregiver may recommend screening at an earlier age if you have risk factors for colon cancer. On a yearly basis, your caregiver may provide home test kits to check for hidden blood in the stool. Use of a small camera at the end of a tube, to directly examine the colon (sigmoidoscopy or colonoscopy), can detect the earliest forms of colorectal cancer. Talk to your caregiver about this at age 50, when routine screening begins. Direct examination of the colon should be repeated every 5 to 10 years through age 75, unless early forms of pre-cancerous polyps or small growths are found.  Hepatitis C blood testing is recommended for all people born from 1945 through 1965 and any individual with known risks for hepatitis C.  Practice safe sex. Use condoms and avoid high-risk sexual practices to reduce the spread of sexually transmitted infections (STIs). Sexually active women aged 25 and younger should be checked for Chlamydia, which is a common sexually  transmitted infection. Older women with new or multiple partners should also be tested for Chlamydia. Testing for other STIs is recommended if you are sexually active and at increased risk.  Osteoporosis is a disease in which the bones lose minerals and strength with aging. This can result in serious bone fractures. The risk of osteoporosis can be identified using a bone density scan. Women ages 65 and over and women at risk for fractures or osteoporosis should discuss screening with their caregivers. Ask your caregiver whether you should be taking a calcium supplement or vitamin D to reduce the rate of osteoporosis.  Menopause can be associated with physical symptoms and risks. Hormone replacement therapy is available to decrease symptoms and risks. You should talk to your caregiver about whether hormone replacement therapy is right for you.  Use sunscreen. Apply sunscreen liberally and repeatedly throughout the day. You should seek shade when your shadow is shorter than you. Protect yourself by wearing long sleeves, pants, a wide-brimmed hat, and sunglasses year round, whenever you are outdoors.  Notify your caregiver of new moles or changes in moles, especially if there is a change in shape or color. Also notify your caregiver if a mole is larger than the size of a pencil eraser.  Stay current with your immunizations. Document Released: 11/22/2010 Document Revised: 09/03/2012 Document Reviewed: 11/22/2010 [employer] Patient Information 2014 [employer].  

## 2013-04-16 NOTE — Progress Notes (Signed)
Debbie Young 06/19/1970 [id number]    History:    The patient presents for annual exam.  Monthly cycle on Loestrin 1/20. Normal mammogram in [place]. LGSIL 97, LEEP 2004 for CIN-3 margins free with normal Paps after. History of benign hematuria evaluated by urologist. Quit smoking this past year and has gained 20 pounds and is working hard to get it off. Father died last year from debilitating cancer. Mother breast cancer age 40, MA,MGM with breast cancer unknown BRCA status.  Past medical history, past surgical history, family history and social history were all reviewed and documented in the EPIC chart. Works for [employer]. Clifton Custard 14 doing well. Seizure disorder as a child, no seizures since age 42.   ROS:  A  ROS was performed and pertinent positives and negatives are included in the history.  Exam:  Filed Vitals:   04/16/13 0821  BP: 114/74    General appearance:  Normal Head/Neck:  Normal, without cervical or supraclavicular adenopathy. Thyroid:  Symmetrical, normal in size, without palpable masses or nodularity. Respiratory  Effort:  Normal  Auscultation:  Clear without wheezing or rhonchi Cardiovascular  Auscultation:  Regular rate, without rubs, murmurs or gallops  Edema/varicosities:  Not grossly evident Abdominal  Soft,nontender, without masses, guarding or rebound.  Liver/spleen:  No organomegaly noted  Hernia:  None appreciated  Skin  Inspection:  Grossly normal  Palpation:  Grossly normal Neurologic/psychiatric  Orientation:  Normal with appropriate conversation.  Mood/affect:  Normal  Genitourinary    Breasts: Examined lying and sitting.     Right: Without masses, retractions, discharge or axillary adenopathy.     Left: Without masses, retractions, discharge or axillary adenopathy.   Inguinal/mons:  Normal without inguinal adenopathy  External genitalia:  Normal  BUS/Urethra/Skene's glands:  Normal  Bladder:  Normal  Vagina:  Normal  Cervix:  Normal  Uterus:    normal in size, shape and contour.  Midline and mobile  Adnexa/parametria:     Rt: Without masses or tenderness.   Lt: Without masses or tenderness.  Anus and perineum: Normal  Digital rectal exam: Normal sphincter tone without palpated masses or tenderness  Assessment/Plan:  42 y.o. MWF G1P1 for annual exam.    LEEP CIN-3 2004 normal Paps after Family history breast cancer Weight gain after quitting smoking  Plan: Discussed BRCA testing, review with her mother. SBE's, continue annual mammogram, calcium rich diet, MVI . Loestrin 1/20 prescription, proper use given and reviewed slight risk for blood clots and strokes. CBC, glucose, lipid panel, TSH, UA, Pap. Continue to work on cutting calories and increasing exercise for continued weight loss.    Harrington Challenger [facility], 9:04 AM 04/16/2013

## 2013-04-21 ENCOUNTER — Other Ambulatory Visit: Payer: Self-pay | Admitting: Women's Health

## 2013-06-19 ENCOUNTER — Other Ambulatory Visit: Payer: Self-pay | Admitting: Women's Health

## 2013-06-19 ENCOUNTER — Ambulatory Visit (INDEPENDENT_AMBULATORY_CARE_PROVIDER_SITE_OTHER): Payer: 59 | Admitting: Women's Health

## 2013-06-19 ENCOUNTER — Encounter: Payer: Self-pay | Admitting: Women's Health

## 2013-06-19 ENCOUNTER — Ambulatory Visit (INDEPENDENT_AMBULATORY_CARE_PROVIDER_SITE_OTHER): Payer: 59

## 2013-06-19 DIAGNOSIS — N912 Amenorrhea, unspecified: Secondary | ICD-10-CM

## 2013-06-19 DIAGNOSIS — N831 Corpus luteum cyst of ovary, unspecified side: Secondary | ICD-10-CM

## 2013-06-19 DIAGNOSIS — R350 Frequency of micturition: Secondary | ICD-10-CM

## 2013-06-19 DIAGNOSIS — O9989 Other specified diseases and conditions complicating pregnancy, childbirth and the puerperium: Secondary | ICD-10-CM

## 2013-06-19 DIAGNOSIS — IMO0001 Reserved for inherently not codable concepts without codable children: Secondary | ICD-10-CM

## 2013-06-19 LAB — URINALYSIS W MICROSCOPIC + REFLEX CULTURE
Bilirubin Urine: NEGATIVE
CRYSTALS: NONE SEEN
Casts: NONE SEEN
GLUCOSE, UA: NEGATIVE mg/dL
KETONES UR: NEGATIVE mg/dL
LEUKOCYTES UA: NEGATIVE
NITRITE: NEGATIVE
PROTEIN: NEGATIVE mg/dL
Specific Gravity, Urine: 1.025 (ref 1.005–1.030)
UROBILINOGEN UA: 0.2 mg/dL (ref 0.0–1.0)
pH: 6.5 (ref 5.0–8.0)

## 2013-06-19 LAB — US OB TRANSVAGINAL

## 2013-06-19 LAB — PREGNANCY, URINE: PREG TEST UR: POSITIVE

## 2013-06-19 NOTE — Progress Notes (Signed)
Patient ID: Debbie Young, female   DOB: 1970-10-16, 43 y.o.   MRN: [id number] Presents with positive home U PT. Stopped OCs December 1, normal cycle December 3. Had 2 days of light spotting on December 27. Denies any bleeding, abdominal pain or vaginal discharge. Having increased urinary urgency with frequency, denies dysuria.  Exam: Appears well. Positive U PT. UA: Moderate blood, 11-20 rbc's, rare bacteria. Ultrasound: Anteverted uterus with a prominent endometrial cavity. No evidence of a gestational sac noted cervix long and closed. Right ovary corpus luteum cyst 24 x 18 mm. Left ovary normal. Negative cul-de-sac. No apparent mass right or left adnexal.  Early pregnancy  Plan: HCG quantitative, repeat in 48 hours. Reviewed most likely early pregnant, 4 weeks, not 8 weeks. If Debbie Young doubles will repeat ultrasound middle of February. Aware we no longer deliver, continue prenatal vitamins daily, aware of safe behaviors with pregnancy. Urine Culture pending

## 2013-06-20 LAB — HCG, QUANTITATIVE, PREGNANCY: hCG, Beta Chain, Quant, S: 475 m[IU]/mL

## 2013-06-21 ENCOUNTER — Other Ambulatory Visit: Payer: Self-pay | Admitting: Women's Health

## 2013-06-21 ENCOUNTER — Other Ambulatory Visit: Payer: 59

## 2013-06-21 ENCOUNTER — Telehealth: Payer: Self-pay | Admitting: *Deleted

## 2013-06-21 DIAGNOSIS — N912 Amenorrhea, unspecified: Secondary | ICD-10-CM

## 2013-06-21 DIAGNOSIS — O3680X0 Pregnancy with inconclusive fetal viability, not applicable or unspecified: Secondary | ICD-10-CM

## 2013-06-21 LAB — URINE CULTURE
COLONY COUNT: NO GROWTH
Organism ID, Bacteria: NO GROWTH

## 2013-06-21 LAB — HCG, QUANTITATIVE, PREGNANCY: hCG, Beta Chain, Quant, S: 770.9 m[IU]/mL

## 2013-06-21 NOTE — Telephone Encounter (Signed)
Pt called asking how long does it take for beta HCG Quant result to come back. Pt informed 1 day per lab.

## 2013-06-26 ENCOUNTER — Other Ambulatory Visit: Payer: 59

## 2013-06-26 DIAGNOSIS — O3680X0 Pregnancy with inconclusive fetal viability, not applicable or unspecified: Secondary | ICD-10-CM

## 2013-06-27 ENCOUNTER — Other Ambulatory Visit: Payer: Self-pay | Admitting: Women's Health

## 2013-06-27 DIAGNOSIS — O0001 Abdominal pregnancy with intrauterine pregnancy: Secondary | ICD-10-CM

## 2013-06-27 DIAGNOSIS — O3680X0 Pregnancy with inconclusive fetal viability, not applicable or unspecified: Secondary | ICD-10-CM

## 2013-06-27 LAB — HCG, QUANTITATIVE, PREGNANCY: hCG, Beta Chain, Quant, S: 45821.1 m[IU]/mL

## 2013-07-03 ENCOUNTER — Ambulatory Visit (INDEPENDENT_AMBULATORY_CARE_PROVIDER_SITE_OTHER): Payer: 59

## 2013-07-03 ENCOUNTER — Other Ambulatory Visit: Payer: Self-pay | Admitting: Women's Health

## 2013-07-03 ENCOUNTER — Ambulatory Visit (INDEPENDENT_AMBULATORY_CARE_PROVIDER_SITE_OTHER): Payer: 59 | Admitting: Women's Health

## 2013-07-03 DIAGNOSIS — O3680X0 Pregnancy with inconclusive fetal viability, not applicable or unspecified: Secondary | ICD-10-CM

## 2013-07-03 DIAGNOSIS — O9989 Other specified diseases and conditions complicating pregnancy, childbirth and the puerperium: Secondary | ICD-10-CM

## 2013-07-03 DIAGNOSIS — N912 Amenorrhea, unspecified: Secondary | ICD-10-CM

## 2013-07-03 LAB — US OB TRANSVAGINAL

## 2013-07-03 NOTE — Progress Notes (Signed)
Patient ID: Debbie Young, female   DOB: Dec 31, 1970, 43 y.o.   MRN: [id number] Presents for viability ultrasound. Questionable dates, denies bleeding, abdominal pain or discharge. Ultrasound 06/19/2013 no evidence of a gestational sac noted. Positive U PT, serial Quants 1/28-475, 1/30-770, 2/08-4580.  Ultrasound: Anteverted uterus with gestational sac in fundus of uterus approximately 6 weeks. Faint fetal heart motion noted. Unable to Doppler fetal heart. Right ovary corpus luteum cyst. Left ovary normal. Cervix long and closed. Unable to identify yolk sac.  Early pregnancy   Plan: Repeat ultrasound in 2 weeks. Instructed to call if bleeding or problems. Continue prenatal vitamins daily.

## 2013-07-17 ENCOUNTER — Other Ambulatory Visit: Payer: 59

## 2013-07-17 ENCOUNTER — Ambulatory Visit: Payer: 59 | Admitting: Women's Health

## 2013-07-18 ENCOUNTER — Other Ambulatory Visit: Payer: 59

## 2013-07-19 ENCOUNTER — Other Ambulatory Visit: Payer: Self-pay | Admitting: Obstetrics & Gynecology

## 2013-07-19 DIAGNOSIS — O3680X0 Pregnancy with inconclusive fetal viability, not applicable or unspecified: Secondary | ICD-10-CM

## 2013-07-24 ENCOUNTER — Other Ambulatory Visit: Payer: 59

## 2013-07-24 ENCOUNTER — Other Ambulatory Visit: Payer: Self-pay | Admitting: Obstetrics & Gynecology

## 2013-07-24 ENCOUNTER — Ambulatory Visit (INDEPENDENT_AMBULATORY_CARE_PROVIDER_SITE_OTHER): Payer: 59

## 2013-07-24 DIAGNOSIS — Z3200 Encounter for pregnancy test, result unknown: Secondary | ICD-10-CM

## 2013-07-24 DIAGNOSIS — O09529 Supervision of elderly multigravida, unspecified trimester: Secondary | ICD-10-CM

## 2013-07-24 DIAGNOSIS — O3680X0 Pregnancy with inconclusive fetal viability, not applicable or unspecified: Secondary | ICD-10-CM

## 2013-07-25 ENCOUNTER — Telehealth: Payer: Self-pay | Admitting: Adult Health

## 2013-07-25 LAB — HCG, QUANTITATIVE, PREGNANCY: hCG, Beta Chain, Quant, S: 21851.2 m[IU]/mL

## 2013-07-25 NOTE — Telephone Encounter (Signed)
Pt aware of labs  

## 2013-07-25 NOTE — Telephone Encounter (Signed)
Pt aware of labs and looks like miscarriage keep appt.

## 2013-07-26 ENCOUNTER — Telehealth: Payer: Self-pay | Admitting: Adult Health

## 2013-07-26 NOTE — Telephone Encounter (Signed)
Pt had already spoke with JAG about results.

## 2013-07-27 ENCOUNTER — Encounter (HOSPITAL_COMMUNITY): Payer: Self-pay | Admitting: Emergency Medicine

## 2013-07-27 ENCOUNTER — Emergency Department (HOSPITAL_COMMUNITY)
Admission: EM | Admit: 2013-07-27 | Discharge: 2013-07-28 | Disposition: A | Discharge status: Home / Self Care | Payer: 59 | Attending: Emergency Medicine | Admitting: Emergency Medicine

## 2013-07-27 DIAGNOSIS — O99350 Diseases of the nervous system complicating pregnancy, unspecified trimester: Secondary | ICD-10-CM

## 2013-07-27 DIAGNOSIS — Z872 Personal history of diseases of the skin and subcutaneous tissue: Secondary | ICD-10-CM | POA: Insufficient documentation

## 2013-07-27 DIAGNOSIS — Z79899 Other long term (current) drug therapy: Secondary | ICD-10-CM | POA: Insufficient documentation

## 2013-07-27 DIAGNOSIS — G40909 Epilepsy, unspecified, not intractable, without status epilepticus: Secondary | ICD-10-CM | POA: Insufficient documentation

## 2013-07-27 DIAGNOSIS — F411 Generalized anxiety disorder: Secondary | ICD-10-CM | POA: Insufficient documentation

## 2013-07-27 DIAGNOSIS — O99340 Other mental disorders complicating pregnancy, unspecified trimester: Secondary | ICD-10-CM | POA: Insufficient documentation

## 2013-07-27 DIAGNOSIS — Z87891 Personal history of nicotine dependence: Secondary | ICD-10-CM | POA: Insufficient documentation

## 2013-07-27 DIAGNOSIS — Z87448 Personal history of other diseases of urinary system: Secondary | ICD-10-CM | POA: Insufficient documentation

## 2013-07-27 DIAGNOSIS — O200 Threatened abortion: Secondary | ICD-10-CM

## 2013-07-27 LAB — CBC WITH DIFFERENTIAL/PLATELET
Basophils Absolute: 0 10*3/uL (ref 0.0–0.1)
Basophils Relative: 0 % (ref 0–1)
Eosinophils Absolute: 0.3 10*3/uL (ref 0.0–0.7)
Eosinophils Relative: 4 % (ref 0–5)
HCT: 35.1 % — ABNORMAL LOW (ref 36.0–46.0)
Hemoglobin: 11.8 g/dL — ABNORMAL LOW (ref 12.0–15.0)
LYMPHS PCT: 37 % (ref 12–46)
Lymphs Abs: 2.6 10*3/uL (ref 0.7–4.0)
MCH: 31.2 pg (ref 26.0–34.0)
MCHC: 33.6 g/dL (ref 30.0–36.0)
MCV: 92.9 fL (ref 78.0–100.0)
MONO ABS: 0.6 10*3/uL (ref 0.1–1.0)
Monocytes Relative: 9 % (ref 3–12)
NEUTROS ABS: 3.5 10*3/uL (ref 1.7–7.7)
Neutrophils Relative %: 50 % (ref 43–77)
PLATELETS: 200 10*3/uL (ref 150–400)
RBC: 3.78 MIL/uL — ABNORMAL LOW (ref 3.87–5.11)
RDW: 12.2 % (ref 11.5–15.5)
WBC: 7 10*3/uL (ref 4.0–10.5)

## 2013-07-27 LAB — BASIC METABOLIC PANEL
BUN: 17 mg/dL (ref 6–23)
CALCIUM: 9.2 mg/dL (ref 8.4–10.5)
CHLORIDE: 101 meq/L (ref 96–112)
CO2: 25 meq/L (ref 19–32)
CREATININE: 0.78 mg/dL (ref 0.50–1.10)
GFR calc non Af Amer: >90 mL/min (ref >90)
Glucose, Bld: 107 mg/dL — ABNORMAL HIGH (ref 70–99)
Potassium: 3.9 mEq/L (ref 3.7–5.3)
SODIUM: 139 meq/L (ref 137–147)

## 2013-07-27 LAB — HCG, QUANTITATIVE, PREGNANCY: hCG, Beta Chain, Quant, S: 13113 m[IU]/mL — ABNORMAL HIGH (ref <5)

## 2013-07-27 NOTE — ED Provider Notes (Signed)
CSN: [id number]     Arrival date & time 07/27/13  2013 History   First MD Initiated Contact with Patient 07/27/13 2215     Chief Complaint  Patient presents with  . Vaginal Bleeding     (Consider location/radiation/quality/duration/timing/severity/associated sxs/prior Treatment) HPI Comments: Patient is a 43 year old female who presents to the emergency department with a complaint of vaginal bleeding. The patient states that she is a proximally [redacted] weeks pregnant. She states it to a half weeks ago her quantitative hCG was a little 45,000, on Wednesday March for it was 21,000. The patient had an ultrasound at the GYN office, and was told that the yolk sac was empty. She was then informed that she would probably have a miscarriage. Today while at work she went to the bathroom and noted a pink spotting being present. She presents now to the emergency department for additional evaluation. She's had some mild pelvic cramping. There's been no excessive pain, no excessive bleeding, no fever, no nausea vomiting.  Patient is a 43 y.o. female presenting with vaginal bleeding. The history is provided by the patient and the spouse.  Vaginal Bleeding Associated symptoms: no abdominal pain, no back pain, no dizziness and no dysuria     Past Medical History  Diagnosis Date  . LGSIL (low grade squamous intraepithelial dysplasia) 1997  . Cellulitis 04/2001    LEFT FOOT  . Seizure disorder     AS A CHILD--NO MEDS SINCE 45 YO  . Hematuria     BENIGN--SAW UROLOGIST  . Smoker   . Anxiety    Past Surgical History  Procedure Laterality Date  . Tonsillectomy and adenoidectomy    . Cholecystectomy    . Bunionectomy  1988, 1990  . Refractive surgery  2000  . Cervical biopsy  w/ loop electrode excision  07/2002  . Cesarean section    . Colposcopy     Family History  Problem Relation Age of Onset  . Breast cancer Mother 71  . Breast cancer Maternal Aunt   . Breast cancer Maternal Grandmother   . Heart  disease Maternal Grandmother   . Hypertension Maternal Grandfather   . Heart disease Paternal Grandmother   . Heart disease Paternal Grandfather   . Cancer Father     [place] and [place]    History  Substance Use Topics  . Smoking status: Former Smoker    Types: Cigarettes  . Smokeless tobacco: Not on file  . Alcohol Use: Yes     Comment: occassional   OB History   Grav Para Term Preterm Abortions TAB SAB Ect Mult Living   2 1 1       1      Review of Systems  Constitutional: Negative for activity change.       All ROS Neg except as noted in HPI  HENT: Negative for nosebleeds.   Eyes: Negative for photophobia and discharge.  Respiratory: Negative for cough, shortness of breath and wheezing.   Cardiovascular: Negative for chest pain and palpitations.  Gastrointestinal: Negative for abdominal pain and blood in stool.  Genitourinary: Positive for vaginal bleeding. Negative for dysuria, frequency and hematuria.  Musculoskeletal: Negative for arthralgias, back pain and neck pain.  Skin: Negative.   Neurological: Positive for seizures. Negative for dizziness and speech difficulty.  Psychiatric/Behavioral: Negative for hallucinations and confusion. The patient is nervous/anxious.       Allergies  Prednisone  Home Medications   Current Outpatient Rx  Name  Route  Sig  Dispense  Refill  . Prenatal Multivit-Min-Fe-FA (PRENATAL VITAMINS PO)   Oral   Take by mouth.         . sodium chloride (OCEAN NASAL SPRAY) 0.65 % nasal spray   Nasal   Place 1 spray into the nose daily as needed for congestion.          LMP 04/24/2013 Physical Exam  Nursing note and vitals reviewed. Constitutional: She is oriented to person, place, and time. She appears well-developed and well-nourished.  Non-toxic appearance.  HENT:  Head: Normocephalic.  Right Ear: Tympanic membrane and external ear normal.  Left Ear: Tympanic membrane and external ear normal.  Eyes:  EOM and lids are normal. Pupils are equal, round, and reactive to light.  Neck: Normal range of motion. Neck supple. Carotid bruit is not present.  Cardiovascular: Normal rate, regular rhythm, normal heart sounds, intact distal pulses and normal pulses.   Pulmonary/Chest: Breath sounds normal. No respiratory distress.  Abdominal: Soft. Bowel sounds are normal. There is no tenderness. There is no guarding.  Musculoskeletal: Normal range of motion.  Lymphadenopathy:       Head (right side): No submandibular adenopathy present.       Head (left side): No submandibular adenopathy present.    She has no cervical adenopathy.  Neurological: She is alert and oriented to person, place, and time. She has normal strength. No cranial nerve deficit or sensory deficit.  Skin: Skin is warm and dry.  Psychiatric: She has a normal mood and affect. Her speech is normal.    ED Course  Procedures (including critical care time) Labs Review Labs Reviewed  CBC WITH DIFFERENTIAL - Abnormal; Notable for the following:    RBC 3.78 (*)    Hemoglobin 11.8 (*)    HCT 35.1 (*)    All other components within normal limits  BASIC METABOLIC PANEL - Abnormal; Notable for the following:    Glucose, Bld 107 (*)    All other components within normal limits  HCG, QUANTITATIVE, PREGNANCY   Imaging Review No results found.   EKG Interpretation None      MDM Is patient is awake and alert and in good spirits. Vital signs are stable. The complete blood count reveals the hemoglobin to be slightly low at 11.8, hematocrit low at 35.1, otherwise within normal limits. The basic metabolic panel is within normal limits. hcg quant 13113, down form 21,500+. Test results given to the patient. No orthostatic vital changes. Pt ambulatory without problem. No active bleeding during ED visit, only pink spotting. Safe for pt to return home with close Gyn follow up by Dr Glo Herring. Pt given return instructions.  Pt to return if any  changes or problem   Final diagnoses:  None    *I have reviewed nursing notes, vital signs, and all appropriate lab and imaging results for this patient.Lenox Ahr, PA-C 07/28/13 [phone]

## 2013-07-27 NOTE — ED Notes (Signed)
Patient states pregnant approx. 7 weeks.  HCG levels dropped over last 2 weeks and had [place] Wednesday and was told there was an empty yolk sac and to possibly expect miscarriage.  Began spotting at work tonight - pink light spotting.  Mild pelvic cramping, like menstrual cramps.

## 2013-07-27 NOTE — Discharge Instructions (Signed)
Your hemoglobin and hematocrit are slightly low, but did not require transfusion at this time. Your quantitative hCG is lower than it was 2 and half weeks ago. This may represent a threatened miscarriage. Please see your GYN specialist for additional evaluation and management. Threatened Miscarriage Bleeding during the first 20 weeks of pregnancy is common. This is sometimes called a threatened miscarriage. This is a pregnancy that is threatening to end before the twentieth week of pregnancy. Often this bleeding stops with bed rest or decreased activities as suggested by your caregiver and the pregnancy continues without any more problems. You may be asked to not have sexual intercourse, have orgasms or use tampons until further notice. Sometimes a threatened miscarriage can progress to a complete or incomplete miscarriage. This may or may not require further treatment. Some miscarriages occur before a woman misses a menstrual period and knows she is pregnant. Miscarriages occur in 65 to 20% of all pregnancies and usually occur during the first 13 weeks of the pregnancy. The exact cause of a miscarriage is usually never known. A miscarriage is natures way of ending a pregnancy that is abnormal or would not make it to term. There are some things that may put you at risk to have a miscarriage, such as:  Hormone problems.  Infection of the uterus or cervix.  Chronic illness, diabetes for example, especially if it is not controlled.  Abnormal shaped uterus.  Fibroids in the uterus.  Incompetent cervix (the cervix is too weak to hold the baby).  Smoking.  Drinking too much alcohol. It's best not to drink any alcohol when you are pregnant.  Taking illegal drugs. TREATMENT  When a miscarriage becomes complete and all products of conception (all the tissue in the uterus) have been passed, often no treatment is needed. If you think you passed tissue, save it in a container and take it to your doctor  for evaluation. If the miscarriage is incomplete (parts of the fetus or placenta remain in the uterus), further treatment may be needed. The most common reason for further treatment is continued bleeding (hemorrhage) because pregnancy tissue did not pass out of the uterus. This often occurs if a miscarriage is incomplete. Tissue left behind may also become infected. Treatment usually is dilatation and curettage (the removal of the remaining products of pregnancy. This can be done by a simple sucking procedure (suction curettage) or a simple scraping of the inside of the uterus. This may be done in the hospital or in the caregiver's office. This is only done when your caregiver knows that there is no chance for the pregnancy to proceed to term. This is determined by physical examination, negative pregnancy test, falling pregnancy hormone count and/or, an ultrasound revealing a dead fetus. Miscarriages are often a very emotional time for prospective mothers and fathers. This is not you or your partners fault. It did not occur because of an inadequacy in you or your partner. Nearly all miscarriages occur because the pregnancy has started off wrongly. At least half of these pregnancies have a chromosomal abnormality. It is almost always not inherited. Others may have developmental problems with the fetus or placenta. This does not always show up even when the products miscarried are studied under the microscope. The miscarriage is nearly always not your fault and it is not likely that you could have prevented it from happening. If you are having emotional and grieving problems, talk to your health care provider and even seek counseling, if necessary, before  getting pregnant again. You can begin trying for another pregnancy as soon as your caregiver says it is OK. HOME CARE INSTRUCTIONS   Your caregiver may order bed rest depending on how much bleeding and cramping you are having. You may be limited to only getting  up to go to the bathroom. You may be allowed to continue light activity. You may need to make arrangements for the care of your other children and for any other responsibilities.  Keep track of the number of pads you use each day, how often you have to change pads and how saturated (soaked) they are. Record this information.  DO NOT USE TAMPONS. Do not douche, have sexual intercourse or orgasms until approved by your caregiver.  You may receive a follow up appointment for re-evaluation of your pregnancy and a repeat blood test. Re-evaluation often occurs after 2 days and again in 4 to 6 weeks. It is very important that you follow-up in the recommended time period.  If you are Rh negative and the father is Rh positive or you do not know the fathers' blood type, you may receive a shot (Rh immune globulin) to help prevent abnormal antibodies that can develop and affect the baby in any future pregnancies. SEEK IMMEDIATE MEDICAL CARE IF:  You have severe cramps in your stomach, back, or abdomen.  You have a sudden onset of severe pain in the lower part of your abdomen.  You develop chills.  You run an unexplained temperature of 101 F (38.3 C) or higher.  You pass large clots or tissue. Save any tissue for your caregiver to inspect.  Your bleeding increases or you become light-headed, weak, or have fainting episodes.  You have a gush of fluid from your vagina.  You pass out. This could mean you have a tubal (ectopic) pregnancy. Document Released: 05/09/2005 Document Revised: 08/01/2011 Document Reviewed: 12/24/2007 [facility] Patient Information 2014 [place].

## 2013-07-28 NOTE — ED Provider Notes (Signed)
Medical screening examination/treatment/procedure(s) were performed by non-physician practitioner and as supervising physician I was immediately available for consultation/collaboration.     Babette Relic, MD 07/28/13 [phone]

## 2013-07-29 NOTE — ED Provider Notes (Signed)
Medical screening examination/treatment/procedure(s) were performed by non-physician practitioner and as supervising physician I was immediately available for consultation/collaboration.   EKG Interpretation None       Babette Relic, MD 07/29/13 [phone]

## 2013-07-31 ENCOUNTER — Telehealth: Payer: Self-pay | Admitting: *Deleted

## 2013-07-31 NOTE — Telephone Encounter (Signed)
Message copied by Farley Ly on Wed Jul 31, 2013  5:22 PM ------      Message from: Jonnie Kind      Created: Tue Jul 30, 2013 11:17 PM      Regarding: missed ab       We need to call pt in followup of her miscarriage, seen in ED cramping on 3/5       ------

## 2013-08-01 NOTE — Telephone Encounter (Signed)
Message copied by Farley Ly on Thu Aug 01, 2013 11:15 AM ------      Message from: Jonnie Kind      Created: Tue Jul 30, 2013 11:17 PM      Regarding: missed ab       We need to call pt in followup of her miscarriage, seen in ED cramping on 3/5       ------

## 2013-08-01 NOTE — Telephone Encounter (Signed)
Pt states that this is a long process, pt states that she has been having shooting pains last night in her right and left side. Pains in bottom of her stomach, and has had some spotting but no "bleeding" no tissue etc. Pt has an appointment tomorrow morning. Pt was advised to push her fluids, take ibuprofen if needed and keep appointment for tomorrow, if any thing changes to call our office or go straight to the ER. Pt verbalized understanding.

## 2013-08-02 ENCOUNTER — Ambulatory Visit (INDEPENDENT_AMBULATORY_CARE_PROVIDER_SITE_OTHER): Payer: 59

## 2013-08-02 ENCOUNTER — Ambulatory Visit (INDEPENDENT_AMBULATORY_CARE_PROVIDER_SITE_OTHER): Payer: 59 | Admitting: Adult Health

## 2013-08-02 ENCOUNTER — Encounter: Payer: Self-pay | Admitting: Adult Health

## 2013-08-02 VITALS — BP 100/68 | Ht 64.0 in | Wt 199.0 lb

## 2013-08-02 DIAGNOSIS — N912 Amenorrhea, unspecified: Secondary | ICD-10-CM

## 2013-08-02 DIAGNOSIS — O039 Complete or unspecified spontaneous abortion without complication: Secondary | ICD-10-CM

## 2013-08-02 DIAGNOSIS — O9989 Other specified diseases and conditions complicating pregnancy, childbirth and the puerperium: Secondary | ICD-10-CM

## 2013-08-02 HISTORY — DX: Complete or unspecified spontaneous abortion without complication: O03.9

## 2013-08-02 MED ORDER — HYDROCODONE-ACETAMINOPHEN 5-325 MG PO TABS: 1.0000 | ORAL_TABLET | Freq: Four times a day (QID) | ORAL | Status: DC | PRN

## 2013-08-02 MED ORDER — MISOPROSTOL 200 MCG PO TABS: ORAL_TABLET | ORAL | Status: DC

## 2013-08-02 NOTE — Progress Notes (Signed)
Subjective:     Patient ID: Marsela P Cowley, female   DOB: [birth year], 43 y.o.   MRN: [id number]  HPI Roniya is a 43 year old white female in for [place] for probale miscarriage, was seen in ER for bleeding on 3/7 and QHCG was dropping, it was 13,113.Thinks blood type A+.Does not want to continue as is.  Review of Systems See HPI Reviewed past medical,surgical, social and family history. Reviewed medications and allergies.      Objective:   Physical Exam BP 100/68  Ht 5\' 4"  (1.626 m)  Wt 199 lb (90.266 kg)  BMI 34.14 kg/m2  LMP 04/24/2013   reviewed [place] with pt and husband, sac decreasing in size, still has yolk sac, no fetal pole Discussed cytotec and pt wants to try this, discussed with Dr Elonda Husky.  Assessment:    Miscarriage    Plan:    Call Monday in follow up  Check [facility] and [facility] Rx cytotec  200 mcg take 4 now and can repeat if needed in 48 hours has 1 refill rx norco 5/325 mg #30 1 every 6 hours prn pain no refills, can take 2 if pain severe Return in 2 weeks for [facility] and see me Review handout on miscariage

## 2013-08-02 NOTE — Patient Instructions (Signed)
Miscarriage A miscarriage is the sudden loss of an unborn baby (fetus) before the 20th week of pregnancy. Most miscarriages happen in the first 3 months of pregnancy. Sometimes, it happens before a woman even knows she is pregnant. A miscarriage is also called a "spontaneous miscarriage" or "early pregnancy loss." Having a miscarriage can be an emotional experience. Talk with your caregiver about any questions you may have about miscarrying, the grieving process, and your future pregnancy plans. CAUSES   Problems with the fetal chromosomes that make it impossible for the baby to develop normally. Problems with the baby's genes or chromosomes are most often the result of errors that occur, by chance, as the embryo divides and grows. The problems are not inherited from the parents.  Infection of the cervix or uterus.   Hormone problems.   Problems with the cervix, such as having an incompetent cervix. This is when the tissue in the cervix is not strong enough to hold the pregnancy.   Problems with the uterus, such as an abnormally shaped uterus, uterine fibroids, or congenital abnormalities.   Certain medical conditions.   Smoking, drinking alcohol, or taking illegal drugs.   Trauma.  Often, the cause of a miscarriage is unknown.  SYMPTOMS   Vaginal bleeding or spotting, with or without cramps or pain.  Pain or cramping in the abdomen or lower back.  Passing fluid, tissue, or blood clots from the vagina. DIAGNOSIS  Your caregiver will perform a physical exam. You may also have an ultrasound to confirm the miscarriage. Blood or urine tests may also be ordered. TREATMENT   Sometimes, treatment is not necessary if you naturally pass all the fetal tissue that was in the uterus. If some of the fetus or placenta remains in the body (incomplete miscarriage), tissue left behind may become infected and must be removed. Usually, a dilation and curettage (D and C) procedure is performed.  During a D and C procedure, the cervix is widened (dilated) and any remaining fetal or placental tissue is gently removed from the uterus.  Antibiotic medicines are prescribed if there is an infection. Other medicines may be given to reduce the size of the uterus (contract) if there is a lot of bleeding.  If you have Rh negative blood and your baby was Rh positive, you will need a Rh immunoglobulin shot. This shot will protect any future baby from having Rh blood problems in future pregnancies. HOME CARE INSTRUCTIONS   Your caregiver may order bed rest or may allow you to continue light activity. Resume activity as directed by your caregiver.  Have someone help with home and family responsibilities during this time.   Keep track of the number of sanitary pads you use each day and how soaked (saturated) they are. Write down this information.   Do not use tampons. Do not douche or have sexual intercourse until approved by your caregiver.   Only take over-the-counter or prescription medicines for pain or discomfort as directed by your caregiver.   Do not take aspirin. Aspirin can cause bleeding.   Keep all follow-up appointments with your caregiver.   If you or your partner have problems with grieving, talk to your caregiver or seek counseling to help cope with the pregnancy loss. Allow enough time to grieve before trying to get pregnant again.  SEEK IMMEDIATE MEDICAL CARE IF:   You have severe cramps or pain in your back or abdomen.  You have a fever.  You pass large blood clots (walnut-sized   or larger) ortissue from your vagina. Save any tissue for your caregiver to inspect.   Your bleeding increases.   You have a thick, bad-smelling vaginal discharge.  You become lightheaded, weak, or you faint.   You have chills.  MAKE SURE YOU:  Understand these instructions.  Will watch your condition.  Will get help right away if you are not doing well or get  worse. Document Released: 11/02/2000 Document Revised: 09/03/2012 Document Reviewed: 06/28/2011 [facility] Patient Information 2014 [place]. Take 4 cytotec  return in 2 weeks for [facility] and see me

## 2013-08-03 LAB — ABO AND RH: Rh Type: POSITIVE

## 2013-08-03 LAB — HCG, QUANTITATIVE, PREGNANCY: HCG, BETA CHAIN, QUANT, S: 13152.1 m[IU]/mL

## 2013-08-05 ENCOUNTER — Encounter: Payer: Self-pay | Admitting: Women's Health

## 2013-08-16 ENCOUNTER — Ambulatory Visit (INDEPENDENT_AMBULATORY_CARE_PROVIDER_SITE_OTHER): Payer: 59 | Admitting: Adult Health

## 2013-08-16 ENCOUNTER — Encounter: Payer: Self-pay | Admitting: Adult Health

## 2013-08-16 VITALS — BP 108/70 | Ht 64.0 in | Wt 197.0 lb

## 2013-08-16 DIAGNOSIS — O039 Complete or unspecified spontaneous abortion without complication: Secondary | ICD-10-CM

## 2013-08-16 NOTE — Patient Instructions (Signed)
Will talk about labs Take prenatals

## 2013-08-16 NOTE — Progress Notes (Signed)
Subjective:     Patient ID: Debbie Young, female   DOB: Debbie Young 18, 1972, 43 y.o.   MRN: [id number]  HPI Debbie Young is back sp miscarriage took Cytotec and had bleeding, will check [facility] today.  Review of Systems See HPI Reviewed past medical,surgical, social and family history. Reviewed medications and allergies.     Objective:   Physical Exam BP 108/70  Ht 5\' 4"  (1.626 m)  Wt 197 lb (89.359 kg)  BMI 33.80 kg/m2  LMP 04/24/2013   Talk only, had bleeding after cytotec, will check [facility] today.Pt desires to try 1 more time for pregnancy.  Assessment:     Miscarriage    Plan:     QHCG today Will talk in am Continue prenatals, but try to wait 3 months to try again Follow up in 3 months

## 2013-08-17 ENCOUNTER — Telehealth: Payer: Self-pay | Admitting: Adult Health

## 2013-08-17 LAB — HCG, QUANTITATIVE, PREGNANCY: HCG, BETA CHAIN, QUANT, S: 20.8 m[IU]/mL

## 2013-08-17 NOTE — Telephone Encounter (Signed)
Pt aware QHCG 20.8 recheck in 2 weeks

## 2013-08-30 ENCOUNTER — Other Ambulatory Visit: Payer: 59

## 2013-08-30 DIAGNOSIS — O021 Missed abortion: Secondary | ICD-10-CM

## 2013-08-31 LAB — HCG, QUANTITATIVE, PREGNANCY: hCG, Beta Chain, Quant, S: <2 m[IU]/mL

## 2013-09-02 ENCOUNTER — Telehealth: Payer: Self-pay | Admitting: Adult Health

## 2013-09-02 NOTE — Telephone Encounter (Signed)
Left message QHCG 2 

## 2013-11-18 ENCOUNTER — Ambulatory Visit (INDEPENDENT_AMBULATORY_CARE_PROVIDER_SITE_OTHER): Payer: 59 | Admitting: Adult Health

## 2013-11-18 ENCOUNTER — Encounter: Payer: Self-pay | Admitting: Adult Health

## 2013-11-18 VITALS — BP 110/80 | Ht 64.0 in | Wt 195.5 lb

## 2013-11-18 DIAGNOSIS — Z319 Encounter for procreative management, unspecified: Secondary | ICD-10-CM

## 2013-11-18 HISTORY — DX: Encounter for procreative management, unspecified: Z31.9

## 2013-11-18 NOTE — Patient Instructions (Signed)
Preparing for Pregnancy Before trying to become pregnant, make an appointment with your health care Kay Shippy (preconception care). The goal is to help you have a healthy, safe pregnancy. At your first appointment, your health care Nivin Braniff will:   Do a complete physical exam, including a Pap test.  Take a complete medical history.  Give you advice and help you resolve any problems. PRECONCEPTION CHECKLIST Here is a list of the basics to cover with your health care Raina Sole at your preconception visit:  Medical history.  Tell your health care Lety Cullens about any diseases you have had. Many diseases can affect your pregnancy.  Include your partner's medical history and family history.  Make sure you have been tested for sexually transmitted infections (STIs). These can affect your pregnancy. In some cases, they can be passed to your baby. Tell your health care Safire Gordin about any history of STIs.  Make sure your health care Aleanna Menge knows about any previous problems you have had with conception or pregnancy.  Tell your health care Autumn Pruitt about any medicine you take. This includes herbal supplements and over-the-counter medicines.  Make sure all your immunizations are up-to-date. You may need to make additional appointments.  Ask your health care Marguerite Barba if you need any vaccinations or if there are any you should avoid.  Diet.  It is especially important to eat a healthy, balanced diet with the right nutrients when you are pregnant.  Ask your health care Sanford Lindblad to help you get to a healthy weight before pregnancy.  If you are overweight, you are at higher risk for certain complications. These include high blood pressure, diabetes, and preterm birth.  If you are underweight, you are more likely to have a low-birth-weight baby.  Lifestyle.  Tell your health care Milia Warth about lifestyle factors such as alcohol use, drug use, or smoking.  Describe any harmful substances you may  be exposed to at work or home. These can include chemicals, pesticides, and radiation.  Mental health.  Let your health care Jayme Mednick know if you have been feeling depressed or anxious.  Let your health care Resa Rinks know if you have a history of substance abuse.  Let your health care Jahmai Finelli know if you do not feel safe at home. HOME INSTRUCTIONS TO PREPARE FOR PREGNANCY Follow your health care Trenisha Lafavor's advice and instructions.   Keep an accurate record of your menstrual periods. This makes it easier for your health care Tyara Dassow to determine your baby's due date.  Begin taking prenatal vitamins and folic acid supplements daily. Take them as directed by your health care Jermell Holeman.  Eat a balanced diet. Get help from a nutrition counselor if you have questions or need help.  Get regular exercise. Try to be active for at least 30 minutes a day most days of the week.  Quit smoking, if you smoke.  Do not drink alcohol.  Do not take illegal drugs.  Get medical problems, such as diabetes or high blood pressure, under control.  If you have diabetes, make sure you do the following:  Have good blood sugar control. If you have type 1 diabetes, use multiple daily doses of insulin. Do not use split-dose or premixed insulin.  Have an eye exam by a qualified eye care professional trained in caring for people with diabetes.  Get evaluated by your health care Takara Sermons for cardiovascular disease.  Get to a healthy weight. If you are overweight or obese, reduce your weight with the help of a qualified health professional such  as a regisitered dietitian. Ask your health care Jarod Bozzo what the right weight range is for you. HOW DO I KNOW I AM PREGNANT? You may be pregnant if you have been sexually active and you miss your period. Symptoms of early pregnancy include:   Mild cramping.  Very light vaginal bleeding (spotting).  Feeling unusually tired.  Morning sickness. If you have any of  these symptoms, take a home pregnancy test. These tests look for a hormone called human chorionic gonadotropin (hCG) in your urine. Your body begins to make this hormone during early pregnancy. These tests are very accurate. Wait until at least the first day you miss your period to take one. If you get a positive result, call your health care Tylan Briguglio to make appointments for prenatal care. WHAT SHOULD I DO IF I BECOME PREGNANT?  Make an appointment with your health care Tavyn Kurka by week 12 of your pregnancy at the latest.  Do not smoke. Smoking can be harmful to your baby.  Do not drink alcoholic beverages. Alcohol is related to a number of birth defects.  Avoid toxic odors and chemicals.  You may continue to have sexual intercourse if it does not cause pain or other problems, such as vaginal bleeding. Document Released: 04/21/2008 Document Revised: 05/14/2013 Document Reviewed: 04/15/2013 [facility] Patient Information 2015 [place], [place]. This information is not intended to replace advice given to you by your health care Bard Haupert. Make sure you discuss any questions you have with your health care Jhony Antrim. Call with menses Have sex every other day day 7 -24 of cycle

## 2013-11-18 NOTE — Progress Notes (Signed)
Subjective:     Patient ID: Debbie Young, female   DOB: 06-27-1970, 43 y.o.   MRN: [id number]  HPI Debbie Young is a 43 year old white female, sp recently miscarriage, desiring to get pregnant, has waited  3 months.Periods have been between 28-30 days and had ovulation mucous last week.  Review of Systems See HPI Reviewed past medical,surgical, social and family history. Reviewed medications and allergies.     Objective:   Physical Exam BP 110/80  Ht 5\' 4"  (1.626 m)  Wt 195 lb 8 oz (88.678 kg)  BMI 33.54 kg/m2  LMP 10/30/2013  Breastfeeding? Unknown   Discussed timing of sex and ovulation.and as soon as she gets pregnant will check labs and get early [place].   Assessment:     Desires pregnancy    Plan:    Review handout on preparing for pregnancy Tale prenatal vitamins Have sex every other day 7-24 of cycle,pee before sex, lay there after sex Call with next cycle or +HPT

## 2014-02-05 ENCOUNTER — Encounter: Payer: Self-pay | Admitting: Adult Health

## 2014-02-05 ENCOUNTER — Ambulatory Visit (INDEPENDENT_AMBULATORY_CARE_PROVIDER_SITE_OTHER): Payer: 59 | Admitting: Adult Health

## 2014-02-05 DIAGNOSIS — Z3201 Encounter for pregnancy test, result positive: Secondary | ICD-10-CM

## 2014-02-05 LAB — POCT URINE PREGNANCY: Preg Test, Ur: POSITIVE

## 2014-02-05 NOTE — Progress Notes (Signed)
Pt here for pregnancy test. Positive result. Pt reports no spotting or cramping. Advised spotting and cramping can be normal in early pregnancy, that's just everything getting settled into place. Advised if she notices either, push fluids, take it easy, and call office to let us know. Pt voiced understanding. [place]

## 2014-02-20 ENCOUNTER — Other Ambulatory Visit: Payer: Self-pay | Admitting: Obstetrics & Gynecology

## 2014-02-20 DIAGNOSIS — O3680X0 Pregnancy with inconclusive fetal viability, not applicable or unspecified: Secondary | ICD-10-CM

## 2014-02-24 ENCOUNTER — Other Ambulatory Visit: Payer: Self-pay | Admitting: Obstetrics & Gynecology

## 2014-02-24 ENCOUNTER — Ambulatory Visit (INDEPENDENT_AMBULATORY_CARE_PROVIDER_SITE_OTHER): Payer: 59 | Admitting: Obstetrics and Gynecology

## 2014-02-24 ENCOUNTER — Ambulatory Visit (INDEPENDENT_AMBULATORY_CARE_PROVIDER_SITE_OTHER): Payer: 59

## 2014-02-24 DIAGNOSIS — O09511 Supervision of elderly primigravida, first trimester: Secondary | ICD-10-CM

## 2014-02-24 DIAGNOSIS — O021 Missed abortion: Secondary | ICD-10-CM

## 2014-02-24 DIAGNOSIS — O3680X0 Pregnancy with inconclusive fetal viability, not applicable or unspecified: Secondary | ICD-10-CM

## 2014-02-24 MED ORDER — OXYCODONE-ACETAMINOPHEN 5-325 MG PO TABS: 1.0000 | ORAL_TABLET | ORAL | Status: DC | PRN

## 2014-02-24 MED ORDER — MISOPROSTOL 200 MCG PO TABS: 800.0000 ug | ORAL_TABLET | Freq: Once | ORAL | Status: DC

## 2014-02-24 NOTE — Patient Instructions (Signed)
Misoprostol tablets What is this medicine? MISOPROSTOL (mye soe PROST ole) helps to prevent stomach ulcers in patients who take medicines like ibuprofen and aspirin and who are at high risk of complications from ulcers. This medicine may be used for other purposes; ask your health care provider or pharmacist if you have questions. COMMON BRAND NAME(S): Cytotec What should I tell my health care provider before I take this medicine? They need to know if you have any of these conditions: -Crohn's disease -heart disease -kidney disease -ulcerative colitis -an unusual or allergic reaction to misoprostol, prostaglandins, other medicines, foods, dyes, or preservatives -pregnant or trying to get pregnant -breast-feeding How should I use this medicine? Take this medicine by mouth with a full glass of water. Follow the directions on the prescription label. Take this medicine with food.Take your medicine at regular intervals. Do not take your medicine more often than directed. Talk to your pediatrician regarding the use of this medicine in children. Special care may be needed. Overdosage: If you think you have taken too much of this medicine contact a poison control center or emergency room at once. NOTE: This medicine is only for you. Do not share this medicine with others. What if I miss a dose? If you miss a dose, take it as soon as you can. If it is almost time for your next dose, take only that dose. Do not take double or extra doses. What may interact with this medicine? -antacids This list may not describe all possible interactions. Give your health care provider a list of all the medicines, herbs, non-prescription drugs, or dietary supplements you use. Also tell them if you smoke, drink alcohol, or use illegal drugs. Some items may interact with your medicine. What should I watch for while using this medicine? Do not smoke cigarettes or drink alcohol. These increase irritation to your stomach  and can make it more susceptible to damage from medicine like ibuprofen and aspirin. If you are female, do not use this medicine if you are pregnant. Do not get pregnant while taking this medicine and for at least one month (one full menstrual cycle) after stopping this medicine. If you can become pregnant, use a reliable form of birth control while taking this medicine. Talk to your doctor about birth control options. If you do become pregnant, think you are pregnant, or want to become pregnant, immediately call your doctor for advice. What side effects may I notice from receiving this medicine? Side effects that you should report to your doctor or health care professional as soon as possible: -allergic reactions like skin rash, itching or hives, swelling of the face, lips, or tongue -chest pain -fainting spells -severe diarrhea -sudden shortness of breath -unusual vaginal bleeding, pelvic pain, or cramping Side effects that usually do not require medical attention (report to your doctor or health care professional if they continue or are bothersome): -dizziness -headache -menstrual irregularity, spotting, or cramps -mild diarrhea -nausea -stomach upset or cramps This list may not describe all possible side effects. Call your doctor for medical advice about side effects. You may report side effects to FDA at 1-800-FDA-1088. Where should I keep my medicine? Keep out of the reach of children. Store at room temperature below 25 degrees C (77 degrees F). Keep in a dry place. Protect from moisture. Throw away any unused medicine after the expiration date. NOTE: This sheet is a summary. It may not cover all possible information. If you have questions about this medicine, talk to   your doctor, pharmacist, or health care provider.  2015, [employer] Standard. (2008-04-22 10:59:53)  

## 2014-02-24 NOTE — Progress Notes (Signed)
Patient ID: Omesha P Matos, female   DOB: 07-14-1970, 43 y.o.   MRN: [id number]   [facility] Visit  Patient name: Shariya P Berrian MRN [id number]  Date of birth: 1971-01-31  CC & HPI:  Anastazia P Staver is a 43 y.o. female presenting today to discuss a positive pregnancy test.  She denies any cramping or vaginal bleeding at this time.  She states that she took a pill orally for her last miscarriage and experienced a lot of pain.  Her last pregnancy was pre-viable.    ROS:  All systems have been reviewed and are negative unless otherwise specified in the HPI.   Pertinent History Reviewed:   Reviewed: Significant for  Medical         Past Medical History  Diagnosis Date  . LGSIL (low grade squamous intraepithelial dysplasia) 1997  . Cellulitis 04/2001    LEFT FOOT  . Seizure disorder     AS A CHILD--NO MEDS SINCE 69 YO  . Hematuria     BENIGN--SAW UROLOGIST  . Smoker   . Anxiety   . Vaginal Pap smear, abnormal   . Miscarriage 08/02/2013  . Patient desires pregnancy 11/18/2013                              Surgical Hx:    Past Surgical History  Procedure Laterality Date  . Tonsillectomy and adenoidectomy    . Cholecystectomy    . Bunionectomy  1988, 1990  . Refractive surgery  2000  . Cervical biopsy  w/ loop electrode excision  07/2002  . Cesarean section    . Colposcopy     Medications: Reviewed & Updated - see associated section                      Current outpatient prescriptions:Prenatal Multivit-Min-Fe-FA (PRENATAL VITAMINS PO), Take by mouth., Disp: , Rfl: ;  sodium chloride (OCEAN NASAL SPRAY) 0.65 % nasal spray, Place 1 spray into the nose daily as needed for congestion., Disp: , Rfl: ;  VENTOLIN HFA 108 (90 BASE) MCG/ACT inhaler, , Disp: , Rfl:    Social History: Reviewed -  reports that she has quit smoking. Her smoking use included Cigarettes. She smoked 0.00 packs per day. She has never used smokeless tobacco.  Objective Findings:  Vitals: Last menstrual  period 12/28/2013.  Physical Examination: Discussion only. See u/s report   Assessment & Plan:   A:  1. Missed Ab  8 wk  Blood type APOS 2. Desire for cytotec tx for missed ab. Discussed tx.  P:  1. Will prescribe Percocet for pain and Cytotec po/pv     This chart was scribed for Jonnie Kind, MD by Donato Schultz, ED Scribe. This patient was seen in room 2 and the patient's care was started at 10:45 AM.

## 2014-02-28 ENCOUNTER — Telehealth: Payer: Self-pay | Admitting: Obstetrics and Gynecology

## 2014-02-28 NOTE — Telephone Encounter (Signed)
Pt states that she took the 4 pills orally and had some cramping and then had some spotting and then stopped. Got the meds refilled and took them vaginally and some cramping and some bleeding but no clots or tissue but not really bleeding now. Pt would like to know what she needs to do.  I spoke with Dr. Glo Herring and he advised that the pt's body is not listening to the medicine. Get pt to make an appointment for Monday and then we will schedule her for Suction D&C on Tuesday. Pt to go to [facility] if anything happens over the weekend.   i advised the pt of above and she verbalized understanding. The call was switched to the front office and an appointment was made.

## 2014-03-03 ENCOUNTER — Encounter: Payer: Self-pay | Admitting: Obstetrics and Gynecology

## 2014-03-03 ENCOUNTER — Ambulatory Visit (INDEPENDENT_AMBULATORY_CARE_PROVIDER_SITE_OTHER): Payer: 59 | Admitting: Obstetrics and Gynecology

## 2014-03-03 VITALS — BP 112/72 | Ht 64.0 in | Wt 201.5 lb

## 2014-03-03 DIAGNOSIS — O021 Missed abortion: Secondary | ICD-10-CM

## 2014-03-03 NOTE — Progress Notes (Addendum)
Patient ID: Debbie Young, female   DOB: 1971-05-06, 43 y.o.   MRN: [id number]   [facility] Visit  Patient name: Debbie Young MRN [id number]  Date of birth: November 04, 1970  CC & HPI:  Debbie Young is a 43 y.o. female presenting today for follow-up from a missed AB.  She has been experiencing mild spotting and cramping since taking Cytotec twice but denies any major bleeding or discomfort.  She experienced heavy vaginal bleeding with mild cramping this morning but states that the cramping has since resolved and she is only spotting currently.  She endorses bilateral flank pain radiating to the center of her abdomen. U/s done in office , revealing a small amount of tissue, the sac has collapsed, and tissue is in lower uterine segment, nearly to internal os. Adnexa negative. ROS:  All system have been reviewed and are negative unless otherwise specified in the HPI.  Pertinent History Reviewed:   Reviewed: Significant for  Medical         Past Medical History  Diagnosis Date  . LGSIL (low grade squamous intraepithelial dysplasia) 1997  . Cellulitis 04/2001    LEFT FOOT  . Seizure disorder     AS A CHILD--NO MEDS SINCE 65 YO  . Hematuria     BENIGN--SAW UROLOGIST  . Smoker   . Anxiety   . Vaginal Pap smear, abnormal   . Miscarriage 08/02/2013  . Patient desires pregnancy 11/18/2013                              Surgical Hx:    Past Surgical History  Procedure Laterality Date  . Tonsillectomy and adenoidectomy    . Cholecystectomy    . Bunionectomy  1988, 1990  . Refractive surgery  2000  . Cervical biopsy  w/ loop electrode excision  07/2002  . Cesarean section    . Colposcopy     Medications: Reviewed & Updated - see associated section                      Current outpatient prescriptions:Prenatal Multivit-Min-Fe-FA (PRENATAL VITAMINS PO), Take by mouth., Disp: , Rfl: ;  oxyCODONE-acetaminophen (PERCOCET/ROXICET) 5-325 MG per tablet, Take 1 tablet by mouth every 4 (four)  hours as needed., Disp: 20 tablet, Rfl: 0;  sodium chloride (OCEAN NASAL SPRAY) 0.65 % nasal spray, Place 1 spray into the nose daily as needed for congestion., Disp: , Rfl:  VENTOLIN HFA 108 (90 BASE) MCG/ACT inhaler, , Disp: , Rfl:    Social History: Reviewed -  reports that she has quit smoking. Her smoking use included Cigarettes. She smoked 0.00 packs per day. She has never used smokeless tobacco.  Objective Findings:  Vitals: Blood pressure 112/72, height 5\' 4"  (1.626 m), weight 201 lb 8 oz (91.4 kg), last menstrual period 12/28/2013, unknown if currently breastfeeding.  Physical Examination: General appearance - alert, well appearing, and in no distress, oriented to person, place, and time and normal appearing weight Pelvic - VULVA: normal appearing vulva with no masses, tenderness or lesions,  VAGINA: normal appearing vagina with normal color and discharge, no lesions,  CERVIX: normal appearing cervix without discharge or lesions, cervix open enough to allow grasping of tissue with uterine packing forceps in lower uterine segment, and with pt valsalva, the tissue is able to be extracted in 3 fragments. UTERUS: uterus is normal size, shape, consistency and nontender, tissue visible just  inside the internal os ADNEXA: normal adnexa in size, nontender and no masses   Assessment & Plan:   A:  1. Missed AB completed in office.  P:  1.Rh positive, confirmed F/u 2 wk. 2. Return to work in 4 days     This chart was scribed for Jonnie Kind, MD by Donato Schultz, ED Scribe. This patient was seen in Room 2 and the patient's care was started at 10:36 AM.

## 2014-03-03 NOTE — Progress Notes (Signed)
Patient ID: Debbie Young, female   DOB: [birth year], 43 y.o.   MRN: [id number] Pt here today to follow up from missed AB. Pt states that she has had some mild spotting and mild cramping since taking the cytotec x 2, but no major bleeding or discomfort. Pt states that when she woke up this morning she had some heavy bleeding but that it is back to spotting now. Pt states that she has had pain on both of her sides that radiates to the center of her stomach. Pt states that she did have some cramping this morning but it is not bad now. Pt states that she was just expecting things to be worse than they have been.

## 2014-03-05 ENCOUNTER — Ambulatory Visit: Payer: 59 | Admitting: Obstetrics and Gynecology

## 2014-03-10 ENCOUNTER — Telehealth: Payer: Self-pay | Admitting: Obstetrics and Gynecology

## 2014-03-10 NOTE — Telephone Encounter (Addendum)
Spoke with the pt and she stated that Dr. Glo Herring was supposed to call in her [facility] pill. The Pt stated that its is supposed to be Junelle, a low dose pill. I advised the pt that I didn't see anything in his last note but I would talk to him about it and get him to send it to her pharmacy.  Pt verbalized understanding.    Junel 1:20 Rx x 1 year escribed. Jonnie Kind, MD    Current Outpatient Prescriptions on File Prior to Visit  Medication Sig Dispense Refill  . oxyCODONE-acetaminophen (PERCOCET/ROXICET) 5-325 MG per tablet Take 1 tablet by mouth every 4 (four) hours as needed.  20 tablet  0  . Prenatal Multivit-Min-Fe-FA (PRENATAL VITAMINS PO) Take by mouth.      . sodium chloride (OCEAN NASAL SPRAY) 0.65 % nasal spray Place 1 spray into the nose daily as needed for congestion.      . VENTOLIN HFA 108 (90 BASE) MCG/ACT inhaler        No current facility-administered medications on file prior to visit.

## 2014-03-11 MED ORDER — NORETHIN ACE-ETH ESTRAD-FE 1-20 MG-MCG PO TABS: 1.0000 | ORAL_TABLET | Freq: Every day | ORAL | Status: DC

## 2014-03-11 NOTE — Telephone Encounter (Signed)
'  Rx junel escribed x 1 yr

## 2014-03-19 ENCOUNTER — Ambulatory Visit (INDEPENDENT_AMBULATORY_CARE_PROVIDER_SITE_OTHER): Payer: 59 | Admitting: Obstetrics and Gynecology

## 2014-03-19 ENCOUNTER — Encounter: Payer: Self-pay | Admitting: Obstetrics and Gynecology

## 2014-03-19 VITALS — BP 120/80 | Ht 64.0 in | Wt 194.0 lb

## 2014-03-19 DIAGNOSIS — Z3202 Encounter for pregnancy test, result negative: Secondary | ICD-10-CM

## 2014-03-19 DIAGNOSIS — Z3200 Encounter for pregnancy test, result unknown: Secondary | ICD-10-CM

## 2014-03-19 DIAGNOSIS — O021 Missed abortion: Secondary | ICD-10-CM

## 2014-03-19 LAB — POCT URINE PREGNANCY: PREG TEST UR: NEGATIVE

## 2014-03-19 NOTE — Progress Notes (Signed)
   [facility] Visit  Patient name: Debbie Young MRN [id number]  Date of birth: Nov 10, 1970  CC & HPI:  Debbie Young is a 43 y.o. female presenting today for follow up from miscarriage. Pt states that she stopped bleeding on thursday so she got the [facility] pills on Friday and started taking them. Pt states that she started spotting mid day on Saturday, and is still spotting today, only there when she wipes. Pt states that she had some heavier bleeding yesterday afternoon that filled up one pad, but has had spotting ever since. She denies any other symptoms. She states that this is her second missed AB. Husband with 2 prior pregnancies 20, 7. Pt with one age 41  ROS:  +Spotting No other complaints  Pertinent History Reviewed:   Reviewed: Significant for  Medical         Past Medical History  Diagnosis Date  . LGSIL (low grade squamous intraepithelial dysplasia) 1997  . Cellulitis 04/2001    LEFT FOOT  . Seizure disorder     AS A CHILD--NO MEDS SINCE 76 YO  . Hematuria     BENIGN--SAW UROLOGIST  . Smoker   . Anxiety   . Vaginal Pap smear, abnormal   . Miscarriage 08/02/2013  . Patient desires pregnancy 11/18/2013                              Surgical Hx:    Past Surgical History  Procedure Laterality Date  . Tonsillectomy and adenoidectomy    . Cholecystectomy    . Bunionectomy  1988, 1990  . Refractive surgery  2000  . Cervical biopsy  w/ loop electrode excision  07/2002  . Cesarean section    . Colposcopy     Medications: Reviewed & Updated - see associated section                      Current outpatient prescriptions:norethindrone-ethinyl estradiol (JUNEL FE 1/20) 1-20 MG-MCG tablet, Take 1 tablet by mouth daily., Disp: 1 Package, Rfl: 11;  Prenatal Multivit-Min-Fe-FA (PRENATAL VITAMINS PO), Take by mouth., Disp: , Rfl: ;  sodium chloride (OCEAN NASAL SPRAY) 0.65 % nasal spray, Place 1 spray into the nose daily as needed for congestion., Disp: , Rfl: ;  VENTOLIN HFA 108  (90 BASE) MCG/ACT inhaler, , Disp: , Rfl:    Social History: Reviewed -  reports that she has quit smoking. Her smoking use included Cigarettes. She smoked 0.00 packs per day. She has never used smokeless tobacco.  Objective Findings:  Vitals: Blood pressure 120/80, height 5\' 4"  (1.626 m), weight 194 lb (87.998 kg), last menstrual period 12/28/2013, not currently breastfeeding. history: pt with 2 misc  Physical Examination:  General appearance - alert, well appearing, and in no distress and oriented to person, place, and time Mental status - alert, oriented to person, place, and time, normal mood, behavior, speech, dress, motor activity, and thought processes  Assessment & Plan:   A:  1. 16 days s/p missed AB 2. Patient desires pregnancy 3. Pregnancy test negative in office 4. BTB on first cycle of pills  P:  1. Continue on the [facility] pills  2. 3 month ocp use before restarting conception efforts.   This chart was scribed for Jonnie Kind, MD by Steva Colder, ED Scribe. The patient was seen in room 1 at 9:04 AM.

## 2014-03-19 NOTE — Patient Instructions (Signed)
Continue pills

## 2014-03-19 NOTE — Progress Notes (Signed)
Patient ID: Debbie Young, female   DOB: 1971/03/04, 43 y.o.   MRN: [id number] Pt here today for follow up from miscarriage. Pt states that she stopped bleeding on thursday so she got the [facility] pills on Friday and started taking them. Pt states that she started spotting mid day on Saturday, and is still spotting today, only there when she wipes. Pt states that she had some heavier bleeding yesterday afternoon that filled up one pad, but has had spotting ever since.

## 2014-03-24 ENCOUNTER — Encounter: Payer: Self-pay | Admitting: Obstetrics and Gynecology

## 2014-12-31 ENCOUNTER — Emergency Department (HOSPITAL_COMMUNITY)
Admission: EM | Admit: 2014-12-31 | Discharge: 2014-12-31 | Disposition: A | Discharge status: Home / Self Care | Payer: No Typology Code available for payment source | Attending: Emergency Medicine | Admitting: Emergency Medicine

## 2014-12-31 ENCOUNTER — Encounter (HOSPITAL_COMMUNITY): Payer: Self-pay

## 2014-12-31 DIAGNOSIS — Z793 Long term (current) use of hormonal contraceptives: Secondary | ICD-10-CM | POA: Insufficient documentation

## 2014-12-31 DIAGNOSIS — S199XXA Unspecified injury of neck, initial encounter: Secondary | ICD-10-CM | POA: Insufficient documentation

## 2014-12-31 DIAGNOSIS — Z8669 Personal history of other diseases of the nervous system and sense organs: Secondary | ICD-10-CM | POA: Insufficient documentation

## 2014-12-31 DIAGNOSIS — S0990XA Unspecified injury of head, initial encounter: Secondary | ICD-10-CM | POA: Insufficient documentation

## 2014-12-31 DIAGNOSIS — Y9389 Activity, other specified: Secondary | ICD-10-CM | POA: Diagnosis not present

## 2014-12-31 DIAGNOSIS — S4992XA Unspecified injury of left shoulder and upper arm, initial encounter: Secondary | ICD-10-CM | POA: Diagnosis present

## 2014-12-31 DIAGNOSIS — S46812A Strain of other muscles, fascia and tendons at shoulder and upper arm level, left arm, initial encounter: Secondary | ICD-10-CM | POA: Insufficient documentation

## 2014-12-31 DIAGNOSIS — Z872 Personal history of diseases of the skin and subcutaneous tissue: Secondary | ICD-10-CM | POA: Insufficient documentation

## 2014-12-31 DIAGNOSIS — S46819A Strain of other muscles, fascia and tendons at shoulder and upper arm level, unspecified arm, initial encounter: Secondary | ICD-10-CM

## 2014-12-31 DIAGNOSIS — Y92410 Unspecified street and highway as the place of occurrence of the external cause: Secondary | ICD-10-CM | POA: Insufficient documentation

## 2014-12-31 DIAGNOSIS — F419 Anxiety disorder, unspecified: Secondary | ICD-10-CM | POA: Diagnosis not present

## 2014-12-31 DIAGNOSIS — Y998 Other external cause status: Secondary | ICD-10-CM | POA: Diagnosis not present

## 2014-12-31 DIAGNOSIS — Z87891 Personal history of nicotine dependence: Secondary | ICD-10-CM | POA: Diagnosis not present

## 2014-12-31 MED ORDER — IBUPROFEN 600 MG PO TABS: 600.0000 mg | ORAL_TABLET | Freq: Four times a day (QID) | ORAL | Status: DC

## 2014-12-31 MED ORDER — METHOCARBAMOL 500 MG PO TABS: 500.0000 mg | ORAL_TABLET | Freq: Three times a day (TID) | ORAL | Status: DC

## 2014-12-31 MED ORDER — KETOROLAC TROMETHAMINE 10 MG PO TABS
10.0000 mg | ORAL_TABLET | Freq: Once | ORAL | Status: AC
  Administered 2014-12-31: 10 mg via ORAL
  Filled 2014-12-31: qty 1

## 2014-12-31 MED ORDER — DIAZEPAM 5 MG PO TABS
10.0000 mg | ORAL_TABLET | Freq: Once | ORAL | Status: AC
  Administered 2014-12-31: 10 mg via ORAL
  Filled 2014-12-31: qty 2
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
## 2015-02-10 ENCOUNTER — Other Ambulatory Visit: Payer: Self-pay | Admitting: Obstetrics and Gynecology

## 2015-04-14 ENCOUNTER — Other Ambulatory Visit: Payer: Self-pay | Admitting: Adult Health

## 2015-04-14 DIAGNOSIS — Z1231 Encounter for screening mammogram for malignant neoplasm of breast: Secondary | ICD-10-CM

## 2015-04-30 ENCOUNTER — Ambulatory Visit (INDEPENDENT_AMBULATORY_CARE_PROVIDER_SITE_OTHER): Payer: 59 | Admitting: Adult Health

## 2015-04-30 ENCOUNTER — Other Ambulatory Visit (HOSPITAL_COMMUNITY)
Admission: RE | Admit: 2015-04-30 | Discharge: 2015-04-30 | Disposition: A | Discharge status: Home / Self Care | Payer: 59 | Source: Ambulatory Visit | Attending: Adult Health | Admitting: Adult Health

## 2015-04-30 ENCOUNTER — Ambulatory Visit (HOSPITAL_COMMUNITY)
Admission: RE | Admit: 2015-04-30 | Discharge: 2015-04-30 | Disposition: A | Discharge status: Home / Self Care | Payer: 59 | Source: Ambulatory Visit | Attending: Adult Health | Admitting: Adult Health

## 2015-04-30 ENCOUNTER — Encounter: Payer: Self-pay | Admitting: Adult Health

## 2015-04-30 VITALS — BP 130/78 | HR 65 | Ht 64.25 in | Wt 176.0 lb

## 2015-04-30 DIAGNOSIS — Z3041 Encounter for surveillance of contraceptive pills: Secondary | ICD-10-CM

## 2015-04-30 DIAGNOSIS — Z01419 Encounter for gynecological examination (general) (routine) without abnormal findings: Secondary | ICD-10-CM

## 2015-04-30 DIAGNOSIS — Z8742 Personal history of other diseases of the female genital tract: Secondary | ICD-10-CM | POA: Insufficient documentation

## 2015-04-30 DIAGNOSIS — Z1212 Encounter for screening for malignant neoplasm of rectum: Secondary | ICD-10-CM

## 2015-04-30 DIAGNOSIS — Z1151 Encounter for screening for human papillomavirus (HPV): Secondary | ICD-10-CM | POA: Diagnosis not present

## 2015-04-30 DIAGNOSIS — Z1231 Encounter for screening mammogram for malignant neoplasm of breast: Secondary | ICD-10-CM | POA: Insufficient documentation

## 2015-04-30 DIAGNOSIS — Z309 Encounter for contraceptive management, unspecified: Secondary | ICD-10-CM | POA: Insufficient documentation

## 2015-04-30 DIAGNOSIS — F17200 Nicotine dependence, unspecified, uncomplicated: Secondary | ICD-10-CM

## 2015-04-30 HISTORY — DX: Personal history of other diseases of the female genital tract: Z87.42

## 2015-04-30 LAB — HEMOCCULT GUIAC POC 1CARD (OFFICE): FECAL OCCULT BLD: NEGATIVE

## 2015-04-30 MED ORDER — NORETHINDRONE 0.35 MG PO TABS: 1.0000 | ORAL_TABLET | Freq: Every day | ORAL | Status: DC

## 2015-04-30 NOTE — Progress Notes (Signed)
Patient ID: Malayia P Whitcher, female   DOB: [birth year], 44 y.o.   MRN: [id number] History of Present Illness: Reneka is a 44 year old white female, married in for well woman gyn exam and pap.She has lost about 25 lbs this year on weight watchers and wants to lose 25 more, she declines flu shot and had mammogram this am.She may still want one more child. PCP is [facility].   Current Medications, Allergies, Past Medical History, Past Surgical History, Family History and Social History were reviewed in [employer] record.     Review of Systems:  Patient denies any headaches, hearing loss, fatigue, blurred vision, shortness of breath, chest pain, abdominal pain, problems with bowel movements, urination, or intercourse. No joint pain or mood swings.   Physical Exam:BP 130/78 mmHg  Pulse 65  Ht 5' 4.25" (1.632 m)  Wt 176 lb (79.833 kg)  BMI 29.97 kg/m2  LMP 04/08/2015 General:  Well developed, well nourished, no acute distress Skin:  Warm and dry Neck:  Midline trachea, normal thyroid, good ROM, no lymphadenopathy Lungs; Clear to auscultation bilaterally Breast: deferred at pt request, had mammogram this am Cardiovascular: Regular rate and rhythm Abdomen:  Soft, non tender, no hepatosplenomegaly Pelvic:  External genitalia is normal in appearance, no lesions.  The vagina is normal in appearance. Urethra has no lesions or masses. The cervix is bulbous.pap with HPV performed.  Uterus is felt to be normal size, shape, and contour.  No adnexal masses or tenderness noted.Bladder is non tender, no masses felt. Rectal: Good sphincter tone, no polyps, internal hemorrhoids felt.  Hemoccult negative. Extremities/musculoskeletal:  No swelling or varicosities noted, no clubbing or cyanosis Psych:  No mood changes, alert and cooperative,seems happy Discussed age and pregnancy and that since she is smoking needs to change OCs to POP, to decrease risk of stroke.  Impression: Well  woman gyn exam with pap Contraceptive management Smoker  History of abnormal pap   Plan: Physical in 1 year Mammogram yearly  Labs fasting in first of year Take folic acid Q000111Q mcg daily Stop junel 1-20 due to smoking and will Rx micronor disp 1 pack take 1 daily with 11 refills,use condoms x 1 pack  Call if decides to try to get pregnant

## 2015-04-30 NOTE — Patient Instructions (Addendum)
Physical in 1 year Mammogram yearly  Stop junel and start micronor, use condoms  Take folic acid daily [id number] mcg

## 2015-05-04 LAB — CYTOLOGY - PAP

## 2015-08-24 ENCOUNTER — Ambulatory Visit (INDEPENDENT_AMBULATORY_CARE_PROVIDER_SITE_OTHER): Payer: 59

## 2015-08-24 ENCOUNTER — Ambulatory Visit (INDEPENDENT_AMBULATORY_CARE_PROVIDER_SITE_OTHER): Payer: 59 | Admitting: Orthopedic Surgery

## 2015-08-24 VITALS — BP 142/62 | HR 64 | Ht 64.0 in | Wt 180.0 lb

## 2015-08-24 DIAGNOSIS — M180 Bilateral primary osteoarthritis of first carpometacarpal joints: Secondary | ICD-10-CM | POA: Diagnosis not present

## 2015-08-24 DIAGNOSIS — M79643 Pain in unspecified hand: Secondary | ICD-10-CM

## 2015-08-24 MED ORDER — DICLOFENAC POTASSIUM 50 MG PO TABS: 50.0000 mg | ORAL_TABLET | Freq: Two times a day (BID) | ORAL | Status: DC

## 2015-08-24 NOTE — Progress Notes (Signed)
Chief Complaint  Patient presents with  . 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 She also has some numbness and tingling in the ring and small finger and some pain over the A1 pulley of the ring finger bilaterally. Denies elbow pain. She notes that she has trouble writing checks for several years. Treatment today includes ibuprofen.  She reported the following symptoms stiffness, numbness, tingling, dull aching pain after activity. Review of Systems  All other systems reviewed and are negative.    Past Medical History  Diagnosis Date  . LGSIL (low grade squamous intraepithelial dysplasia) 1997  . Cellulitis 04/2001    LEFT FOOT  . Seizure disorder ([place])     AS A CHILD--NO MEDS SINCE 73 YO  . Hematuria     BENIGN--SAW UROLOGIST  . Smoker   . Anxiety   . Vaginal Pap smear, abnormal   . Miscarriage 08/02/2013  . Patient desires pregnancy 11/18/2013  . History of abnormal cervical Pap smear 04/30/2015    Past Surgical History  Procedure Laterality Date  . Tonsillectomy and adenoidectomy    . Cholecystectomy    . Bunionectomy  1988, 1990  . Refractive surgery  2000  . Cervical biopsy  w/ loop electrode excision  07/2002  . Cesarean section    . Colposcopy     Family History  Problem Relation Age of Onset  . Breast cancer Mother 7  . Breast cancer Maternal Aunt   . Breast cancer Maternal Grandmother   . Heart disease Maternal Grandmother   . Hypertension Maternal Grandfather   . Stroke Maternal Grandfather   . Heart disease Paternal Grandmother   . Heart disease Paternal Grandfather   . Cancer Father     Myloma and [place] water    Social History  Substance Use Topics  . Smoking status: Current Every Day Smoker -- 0.50 packs/day for 20 years    Types:  Cigarettes  . Smokeless tobacco: Never Used  . Alcohol Use: Yes     Comment: occassional    Current outpatient prescriptions:  .  Docusate Sodium (COLACE PO), Take by mouth as needed., Disp: , Rfl:  .  ibuprofen (ADVIL,MOTRIN) 200 MG tablet, Take 200 mg by mouth as needed., Disp: , Rfl:  .  norethindrone (MICRONOR,CAMILA,ERRIN) 0.35 MG tablet, Take 1 tablet (0.35 mg total) by mouth daily., Disp: 1 Package, Rfl: 11 .  Probiotic Product (PROBIOTIC PO), Take by mouth daily., Disp: , Rfl:  .  VENTOLIN HFA 108 (90 BASE) MCG/ACT inhaler, Inhale 2 puffs into the lungs every 4 (four) hours as needed. , Disp: , Rfl:   BP 142/62 mmHg  Pulse 64  Ht 5\' 4"  (1.626 m)  Wt 180 lb (81.647 kg)  BMI 30.88 kg/m2  Physical Exam  Constitutional: She is oriented to person, place, and time. She appears well-developed and well-nourished. No distress.  Cardiovascular: Normal rate and intact distal pulses.   Neurological: She is alert and oriented to person, place, and time. She has normal strength and normal reflexes. She displays no atrophy. No sensory deficit. She exhibits normal muscle tone. Coordination normal.  Negative Tinel's wrist and elbow and negative compression test of the area of Guyon's canal  Skin: Skin is warm and dry. No rash noted. She is  not diaphoretic. No erythema. No pallor.  Psychiatric: She has a normal mood and affect. Her behavior is normal. Judgment and thought content normal.    Ortho Exam  Tenderness over the A1 pulley of the small finger of both hands the other A1 pulleys were normal. She had good pinch strength. She had no atrophy of the thenar or hyperthenar areas. Color and perfusion were normal. Flexion extension normal. She had tenderness at the [facility] joint of both thumbs   ASSESSMENT: My personal interpretation of the images:  Bilateral hand films show fairly normal CMC joints maybe a little bit of a spur on the left one but overall joint congruity looks good no joint space  narrowing.    PLAN Probably early onset arthritis of the basilar joints of the thumbs.  Recommend oral anti-inflammatory medication.

## 2016-02-14 ENCOUNTER — Emergency Department (HOSPITAL_COMMUNITY)
Admission: EM | Admit: 2016-02-14 | Discharge: 2016-02-14 | Disposition: A | Discharge status: Home / Self Care | Payer: 59 | Attending: Emergency Medicine | Admitting: Emergency Medicine

## 2016-02-14 ENCOUNTER — Emergency Department (HOSPITAL_COMMUNITY): Payer: 59

## 2016-02-14 ENCOUNTER — Encounter (HOSPITAL_COMMUNITY): Payer: Self-pay | Admitting: Emergency Medicine

## 2016-02-14 DIAGNOSIS — Z79899 Other long term (current) drug therapy: Secondary | ICD-10-CM | POA: Insufficient documentation

## 2016-02-14 DIAGNOSIS — F17210 Nicotine dependence, cigarettes, uncomplicated: Secondary | ICD-10-CM | POA: Diagnosis not present

## 2016-02-14 DIAGNOSIS — M79671 Pain in right foot: Secondary | ICD-10-CM | POA: Diagnosis not present

## 2016-02-14 DIAGNOSIS — Z791 Long term (current) use of non-steroidal anti-inflammatories (NSAID): Secondary | ICD-10-CM | POA: Diagnosis not present

## 2016-02-14 NOTE — ED Triage Notes (Signed)
Pt reports tripping over the dog, injury to R foot. Pt has had prior surgery on same.

## 2016-02-14 NOTE — ED Provider Notes (Signed)
[name] DEPT Provider Note   CSN: [id number] Arrival date & time: 02/14/16  1108     History   Chief Complaint Chief Complaint  Patient presents with  . Foot Pain    HPI Debbie Young is a 45 y.o. female.  Status post recent trip over her dog injuring the right foot. Status post "surgery" on same foot.  No other injuries.      Past Medical History:  Diagnosis Date  . Anxiety   . Cellulitis 04/2001   LEFT FOOT  . Hematuria    BENIGN--SAW UROLOGIST  . History of abnormal cervical Pap smear 04/30/2015  . LGSIL (low grade squamous intraepithelial dysplasia) 1997  . Miscarriage 08/02/2013  . Patient desires pregnancy 11/18/2013  . Seizure disorder ([place])    AS A CHILD--NO MEDS SINCE 26 YO  . Smoker   . Vaginal Pap smear, abnormal     Patient Active Problem List   Diagnosis Date Noted  . Contraceptive management 04/30/2015  . History of abnormal cervical Pap smear 04/30/2015  . Patient desires pregnancy 11/18/2013  . Miscarriage 08/02/2013  . LGSIL (low grade squamous intraepithelial dysplasia)   . Cellulitis   . Seizure disorder ([place])   . Hematuria   . Smoker     Past Surgical History:  Procedure Laterality Date  . [place]  . CERVICAL BIOPSY  W/ LOOP ELECTRODE EXCISION  07/2002  . CESAREAN SECTION    . CHOLECYSTECTOMY    . COLPOSCOPY    . REFRACTIVE SURGERY  2000  . TONSILLECTOMY AND ADENOIDECTOMY      OB History    Gravida Para Term Preterm AB Living   3 1 1   1 1    SAB TAB Ectopic Multiple Live Births   1               Home Medications    Prior to Admission medications   Medication Sig Start Date End Date Taking? Authorizing Provider  diclofenac (CATAFLAM) 50 MG tablet Take 1 tablet (50 mg total) by mouth 2 (two) times daily. 08/24/15   Carole Civil, MD  Docusate Sodium (COLACE PO) Take by mouth as needed.    Historical Provider, MD  ibuprofen (ADVIL,MOTRIN) 200 MG tablet Take 200 mg by mouth as needed.     Historical Provider, MD  norethindrone (MICRONOR,CAMILA,ERRIN) 0.35 MG tablet Take 1 tablet (0.35 mg total) by mouth daily. 04/30/15   Estill Dooms, NP  Probiotic Product (PROBIOTIC PO) Take by mouth daily.    Historical Provider, MD  VENTOLIN HFA 108 (90 BASE) MCG/ACT inhaler Inhale 2 puffs into the lungs every 4 (four) hours as needed.  09/05/13   Historical Provider, MD    Family History Family History  Problem Relation Age of Onset  . Breast cancer Mother 66  . Breast cancer Maternal Aunt   . Breast cancer Maternal Grandmother   . Heart disease Maternal Grandmother   . Hypertension Maternal Grandfather   . Stroke Maternal Grandfather   . Heart disease Paternal Grandmother   . Heart disease Paternal Grandfather   . Cancer Father     Myloma and [place] water     Social History Social History  Substance Use Topics  . Smoking status: Current Every Day Smoker    Packs/day: 0.50    Years: 20.00    Types: Cigarettes  . Smokeless tobacco: Never Used  . Alcohol use Yes     Comment: occassional  Allergies   Prednisone   Review of Systems Review of Systems  All other systems reviewed and are negative.    Physical Exam Updated Vital Signs BP 118/78 (BP Location: Right Arm)   Pulse 60   Temp 98.6 F (37 C) (Oral)   Resp 18   Ht 5\' 4"  (1.626 m)   Wt 180 lb (81.6 kg)   LMP 01/21/2016   SpO2 100%   BMI 30.90 kg/m   Physical Exam  Constitutional: She is oriented to person, place, and time. She appears well-developed and well-nourished.  HENT:  Head: Normocephalic and atraumatic.  Eyes: Conjunctivae are normal.  Neck: Neck supple.  Musculoskeletal:  Right foot:  Most tender over proximal phalanx of the great toe. Pain with range of motion.  Neurological: She is alert and oriented to person, place, and time.  Skin: Skin is warm and dry.  Psychiatric: She has a normal mood and affect. Her behavior is normal.  Nursing note and vitals  reviewed.    ED Treatments / Results  Labs (all labs ordered are listed, but only abnormal results are displayed) Labs Reviewed - No data to display  EKG  EKG Interpretation None       Radiology Dg Foot Complete Right  Result Date: 02/14/2016 CLINICAL DATA:  Right foot pain after tripping over her dog this morning. History of bunionectomy. EXAM: RIGHT FOOT COMPLETE - 3+ VIEW COMPARISON:  None. FINDINGS: There is no evidence of fracture or dislocation. Slight joint space narrowing of the fifth PIP joint with minimal spurring medially off the base of the fifth middle phalanx. Soft tissues are unremarkable. Postsurgical appearance of the medial first metatarsal head consistent with history of bunionectomy. IMPRESSION: No acute osseous abnormality. First metatarsal bunionectomy. Of this there is what is thought R bladder that stop were not last seen caps as the the he is go for 1 closed right is likely a R ight arch lobe of the by a lumbar PE flow with me the below the may indicate go technique do mildly lower the go a is a R go EPA and he is case as a full-thickness slightly progressed there is a well-defined thin a PE is the fibroids and air that it may be due to a the newly a lot a B a prior two-view hit in the for two-view the AP view a are low all but I cannot is a a have 0 4 go of the T2 or the heart knee pain but that the go go PTV included the right lobe noted back below the there are moderately 0 day and revealed below the knee. The left lab in the right groin were Mickel Baas below the versus 8 the use the low no go Edit marked Electronically Signed   By: Ashley Royalty M.D.   On: 02/14/2016 12:01    Procedures Procedures (including critical care time)  Medications Ordered in ED Medications - No data to display   Initial Impression / Assessment and Plan / ED Course  I have reviewed the triage vital signs and the nursing notes.  Pertinent labs & imaging results that were available during  my care of the patient were reviewed by me and considered in my medical decision making (see chart for details).  Clinical Course    Plain films of right foot show no obvious fracture. Ice, elevate, buddy tape  Final Clinical Impressions(s) / ED Diagnoses   Final diagnoses:  Foot pain, left    New Prescriptions New  Prescriptions   No medications on file     Nat Christen, MD 02/14/16 [phone]

## 2016-02-14 NOTE — Discharge Instructions (Signed)
X-ray shows no fracture. You will be sore for several days. Elevate foot. Ice. Tylenol or ibuprofen. Buddy tape.

## 2016-07-27 ENCOUNTER — Ambulatory Visit (INDEPENDENT_AMBULATORY_CARE_PROVIDER_SITE_OTHER): Payer: Commercial Managed Care - HMO | Admitting: Adult Health

## 2016-07-27 ENCOUNTER — Encounter: Payer: Self-pay | Admitting: Adult Health

## 2016-07-27 VITALS — BP 132/86 | HR 85 | Ht 64.25 in | Wt 202.0 lb

## 2016-07-27 DIAGNOSIS — Z3201 Encounter for pregnancy test, result positive: Secondary | ICD-10-CM | POA: Diagnosis not present

## 2016-07-27 DIAGNOSIS — F17200 Nicotine dependence, unspecified, uncomplicated: Secondary | ICD-10-CM

## 2016-07-27 DIAGNOSIS — F17210 Nicotine dependence, cigarettes, uncomplicated: Secondary | ICD-10-CM

## 2016-07-27 DIAGNOSIS — N926 Irregular menstruation, unspecified: Secondary | ICD-10-CM | POA: Diagnosis not present

## 2016-07-27 DIAGNOSIS — O09521 Supervision of elderly multigravida, first trimester: Secondary | ICD-10-CM

## 2016-07-27 DIAGNOSIS — Z3490 Encounter for supervision of normal pregnancy, unspecified, unspecified trimester: Secondary | ICD-10-CM

## 2016-07-27 DIAGNOSIS — O3680X0 Pregnancy with inconclusive fetal viability, not applicable or unspecified: Secondary | ICD-10-CM

## 2016-07-27 DIAGNOSIS — O09299 Supervision of pregnancy with other poor reproductive or obstetric history, unspecified trimester: Secondary | ICD-10-CM

## 2016-07-27 LAB — POCT URINE PREGNANCY: PREG TEST UR: POSITIVE — AB

## 2016-07-27 NOTE — Patient Instructions (Signed)
First Trimester of Pregnancy The first trimester of pregnancy is from week 1 until the end of week 13 (months 1 through 3). A week after a sperm fertilizes an egg, the egg will implant on the wall of the uterus. This embryo will begin to develop into a baby. Genes from you and your partner will form the baby. The female genes will determine whether the baby will be a boy or a girl. At 6-8 weeks, the eyes and face will be formed, and the heartbeat can be seen on ultrasound. At the end of 12 weeks, all the baby's organs will be formed. Now that you are pregnant, you will want to do everything you can to have a healthy baby. Two of the most important things are to get good prenatal care and to follow your health care provider's instructions. Prenatal care is all the medical care you receive before the baby's birth. This care will help prevent, find, and treat any problems during the pregnancy and childbirth. Body changes during your first trimester Your body goes through many changes during pregnancy. The changes vary from woman to woman.  You may gain or lose a couple of pounds at first.  You may feel sick to your stomach (nauseous) and you may throw up (vomit). If the vomiting is uncontrollable, call your health care provider.  You may tire easily.  You may develop headaches that can be relieved by medicines. All medicines should be approved by your health care provider.  You may urinate more often. Painful urination may mean you have a bladder infection.  You may develop heartburn as a result of your pregnancy.  You may develop constipation because certain hormones are causing the muscles that push stool through your intestines to slow down.  You may develop hemorrhoids or swollen veins (varicose veins).  Your breasts may begin to grow larger and become tender. Your nipples may stick out more, and the tissue that surrounds them (areola) may become darker.  Your gums may bleed and may be  sensitive to brushing and flossing.  Dark spots or blotches (chloasma, mask of pregnancy) may develop on your face. This will likely fade after the baby is born.  Your menstrual periods will stop.  You may have a loss of appetite.  You may develop cravings for certain kinds of food.  You may have changes in your emotions from day to day, such as being excited to be pregnant or being concerned that something may go wrong with the pregnancy and baby.  You may have more vivid and strange dreams.  You may have changes in your hair. These can include thickening of your hair, rapid growth, and changes in texture. Some women also have hair loss during or after pregnancy, or hair that feels dry or thin. Your hair will most likely return to normal after your baby is born.  What to expect at prenatal visits During a routine prenatal visit:  You will be weighed to make sure you and the baby are growing normally.  Your blood pressure will be taken.  Your abdomen will be measured to track your baby's growth.  The fetal heartbeat will be listened to between weeks 10 and 14 of your pregnancy.  Test results from any previous visits will be discussed.  Your health care provider may ask you:  How you are feeling.  If you are feeling the baby move.  If you have had any abnormal symptoms, such as leaking fluid, bleeding, severe headaches,   or abdominal cramping.  If you are using any tobacco products, including cigarettes, chewing tobacco, and electronic cigarettes.  If you have any questions.  Other tests that may be performed during your first trimester include:  Blood tests to find your blood type and to check for the presence of any previous infections. The tests will also be used to check for low iron levels (anemia) and protein on red blood cells (Rh antibodies). Depending on your risk factors, or if you previously had diabetes during pregnancy, you may have tests to check for high blood  sugar that affects pregnant women (gestational diabetes).  Urine tests to check for infections, diabetes, or protein in the urine.  An ultrasound to confirm the proper growth and development of the baby.  Fetal screens for spinal cord problems (spina bifida) and Down syndrome.  HIV (human immunodeficiency virus) testing. Routine prenatal testing includes screening for HIV, unless you choose not to have this test.  You may need other tests to make sure you and the baby are doing well.  Follow these instructions at home: Medicines  Follow your health care provider's instructions regarding medicine use. Specific medicines may be either safe or unsafe to take during pregnancy.  Take a prenatal vitamin that contains at least 600 micrograms (mcg) of folic acid.  If you develop constipation, try taking a stool softener if your health care provider approves. Eating and drinking  Eat a balanced diet that includes fresh fruits and vegetables, whole grains, good sources of protein such as meat, eggs, or tofu, and low-fat dairy. Your health care provider will help you determine the amount of weight gain that is right for you.  Avoid raw meat and uncooked cheese. These carry germs that can cause birth defects in the baby.  Eating four or five small meals rather than three large meals a day may help relieve nausea and vomiting. If you start to feel nauseous, eating a few soda crackers can be helpful. Drinking liquids between meals, instead of during meals, also seems to help ease nausea and vomiting.  Limit foods that are high in fat and processed sugars, such as fried and sweet foods.  To prevent constipation: ? Eat foods that are high in fiber, such as fresh fruits and vegetables, whole grains, and beans. ? Drink enough fluid to keep your urine clear or pale yellow. Activity  Exercise only as directed by your health care provider. Most women can continue their usual exercise routine during  pregnancy. Try to exercise for 30 minutes at least 5 days a week. Exercising will help you: ? Control your weight. ? Stay in shape. ? Be prepared for labor and delivery.  Experiencing pain or cramping in the lower abdomen or lower back is a good sign that you should stop exercising. Check with your health care provider before continuing with normal exercises.  Try to avoid standing for long periods of time. Move your legs often if you must stand in one place for a long time.  Avoid heavy lifting.  Wear low-heeled shoes and practice good posture.  You may continue to have sex unless your health care provider tells you not to. Relieving pain and discomfort  Wear a good support bra to relieve breast tenderness.  Take warm sitz baths to soothe any pain or discomfort caused by hemorrhoids. Use hemorrhoid cream if your health care provider approves.  Rest with your legs elevated if you have leg cramps or low back pain.  If you develop   varicose veins in your legs, wear support hose. Elevate your feet for 15 minutes, 3-4 times a day. Limit salt in your diet. Prenatal care  Schedule your prenatal visits by the twelfth week of pregnancy. They are usually scheduled monthly at first, then more often in the last 2 months before delivery.  Write down your questions. Take them to your prenatal visits.  Keep all your prenatal visits as told by your health care provider. This is important. Safety  Wear your seat belt at all times when driving.  Make a list of emergency phone numbers, including numbers for family, friends, the hospital, and police and fire departments. General instructions  Ask your health care provider for a referral to a local prenatal education class. Begin classes no later than the beginning of month 6 of your pregnancy.  Ask for help if you have counseling or nutritional needs during pregnancy. Your health care provider can offer advice or refer you to specialists for help  with various needs.  Do not use hot tubs, steam rooms, or saunas.  Do not douche or use tampons or scented sanitary pads.  Do not cross your legs for long periods of time.  Avoid cat litter boxes and soil used by cats. These carry germs that can cause birth defects in the baby and possibly loss of the fetus by miscarriage or stillbirth.  Avoid all smoking, herbs, alcohol, and medicines not prescribed by your health care provider. Chemicals in these products affect the formation and growth of the baby.  Do not use any products that contain nicotine or tobacco, such as cigarettes and e-cigarettes. If you need help quitting, ask your health care provider. You may receive counseling support and other resources to help you quit.  Schedule a dentist appointment. At home, brush your teeth with a soft toothbrush and be gentle when you floss. Contact a health care provider if:  You have dizziness.  You have mild pelvic cramps, pelvic pressure, or nagging pain in the abdominal area.  You have persistent nausea, vomiting, or diarrhea.  You have a bad smelling vaginal discharge.  You have pain when you urinate.  You notice increased swelling in your face, hands, legs, or ankles.  You are exposed to fifth disease or chickenpox.  You are exposed to German measles (rubella) and have never had it. Get help right away if:  You have a fever.  You are leaking fluid from your vagina.  You have spotting or bleeding from your vagina.  You have severe abdominal cramping or pain.  You have rapid weight gain or loss.  You vomit blood or material that looks like coffee grounds.  You develop a severe headache.  You have shortness of breath.  You have any kind of trauma, such as from a fall or a car accident. Summary  The first trimester of pregnancy is from week 1 until the end of week 13 (months 1 through 3).  Your body goes through many changes during pregnancy. The changes vary from  woman to woman.  You will have routine prenatal visits. During those visits, your health care provider will examine you, discuss any test results you may have, and talk with you about how you are feeling. This information is not intended to replace advice given to you by your health care provider. Make sure you discuss any questions you have with your health care provider. Document Released: 05/03/2001 Document Revised: 04/20/2016 Document Reviewed: 04/20/2016 [employer]vier Interactive Patient Education  2017 [employer]  

## 2016-07-27 NOTE — Progress Notes (Signed)
Subjective:     Patient ID: Debbie Young, female   DOB: [birth year], 46 y.o.   MRN: [id number]  HPI Debbie Young is a 46 year old white female, married in for UPT, has missed a period and had +HPT 07/15/16.She has history of miscarriage.   Review of Systems +missed period Reviewed past medical,surgical, social and family history. Reviewed medications and allergies.     Objective:   Physical Exam BP 132/86 (BP Location: Left Arm, Patient Position: Sitting, Cuff Size: Normal)   Pulse 85   Ht 5' 4.25" (1.632 m)   Wt 202 lb (91.6 kg)   LMP 06/08/2016 (Approximate)   BMI 34.40 kg/m UPT +, about 7 weeks, by LMP with EDD 03/15/17.Skin warm and dry. Neck: mid line trachea, normal thyroid, good ROM, no lymphadenopathy noted. Lungs: clear to ausculation bilaterally. Cardiovascular: regular rate and rhythm.Abdomen is soft and non tender    Assessment:     1. Positive pregnancy test   2. Pregnancy, unspecified gestational age   21. Elderly multigravida in first trimester   4. Smoker   5. Encounter to determine fetal viability of pregnancy, single or unspecified fetus   6.      History of miscarriage     Plan:     Check QHCG and progesterone Return 3/13 for dating [place] Decrease smoking Review handout on first trimester

## 2016-07-28 ENCOUNTER — Telehealth: Payer: Self-pay | Admitting: Adult Health

## 2016-07-28 LAB — PROGESTERONE: Progesterone: 19.8 ng/mL

## 2016-07-28 LAB — BETA HCG QUANT (REF LAB): hCG Quant: 33302 m[IU]/mL

## 2016-07-28 NOTE — Telephone Encounter (Signed)
Pt aware that labs look good, keep [place] appt next week

## 2016-08-02 ENCOUNTER — Other Ambulatory Visit: Payer: 59

## 2016-08-05 ENCOUNTER — Ambulatory Visit (INDEPENDENT_AMBULATORY_CARE_PROVIDER_SITE_OTHER): Payer: Commercial Managed Care - HMO

## 2016-08-05 DIAGNOSIS — O3481 Maternal care for other abnormalities of pelvic organs, first trimester: Secondary | ICD-10-CM | POA: Diagnosis not present

## 2016-08-05 DIAGNOSIS — Z3A08 8 weeks gestation of pregnancy: Secondary | ICD-10-CM | POA: Diagnosis not present

## 2016-08-05 DIAGNOSIS — O3680X0 Pregnancy with inconclusive fetal viability, not applicable or unspecified: Secondary | ICD-10-CM | POA: Diagnosis not present

## 2016-08-05 NOTE — Progress Notes (Signed)
[place] 8+2 wks,single IUP w/ys, positive fht 158 bpm,normal right ovary,left simple corpus luteal cyst 2.9 x 2.2 x 2.4 cm,crl 16.2 mm,EDD 03/15/2017

## 2016-08-24 ENCOUNTER — Ambulatory Visit: Payer: Commercial Managed Care - HMO | Admitting: *Deleted

## 2016-08-24 ENCOUNTER — Ambulatory Visit (INDEPENDENT_AMBULATORY_CARE_PROVIDER_SITE_OTHER): Payer: Commercial Managed Care - HMO | Admitting: Women's Health

## 2016-08-24 ENCOUNTER — Encounter: Payer: Self-pay | Admitting: Women's Health

## 2016-08-24 VITALS — BP 120/88 | Wt 208.0 lb

## 2016-08-24 DIAGNOSIS — O09521 Supervision of elderly multigravida, first trimester: Secondary | ICD-10-CM

## 2016-08-24 DIAGNOSIS — Z1389 Encounter for screening for other disorder: Secondary | ICD-10-CM

## 2016-08-24 DIAGNOSIS — O0990 Supervision of high risk pregnancy, unspecified, unspecified trimester: Secondary | ICD-10-CM

## 2016-08-24 DIAGNOSIS — O0991 Supervision of high risk pregnancy, unspecified, first trimester: Secondary | ICD-10-CM

## 2016-08-24 DIAGNOSIS — Z331 Pregnant state, incidental: Secondary | ICD-10-CM

## 2016-08-24 DIAGNOSIS — Z98891 History of uterine scar from previous surgery: Secondary | ICD-10-CM

## 2016-08-24 DIAGNOSIS — Z3481 Encounter for supervision of other normal pregnancy, first trimester: Secondary | ICD-10-CM

## 2016-08-24 DIAGNOSIS — Z8751 Personal history of pre-term labor: Secondary | ICD-10-CM

## 2016-08-24 DIAGNOSIS — G40909 Epilepsy, unspecified, not intractable, without status epilepticus: Secondary | ICD-10-CM

## 2016-08-24 DIAGNOSIS — Z3682 Encounter for antenatal screening for nuchal translucency: Secondary | ICD-10-CM

## 2016-08-24 DIAGNOSIS — Z3A11 11 weeks gestation of pregnancy: Secondary | ICD-10-CM

## 2016-08-24 DIAGNOSIS — O09529 Supervision of elderly multigravida, unspecified trimester: Secondary | ICD-10-CM | POA: Insufficient documentation

## 2016-08-24 NOTE — Patient Instructions (Signed)
Begin taking a 81mg  baby aspirin daily at 12 weeks of pregnancy (08/31/16) to decrease risk of preeclampsia during pregnancy    Nausea & Vomiting  Have saltine crackers or pretzels by your bed and eat a few bites before you raise your head out of bed in the morning  Eat small frequent meals throughout the day instead of large meals  Drink plenty of fluids throughout the day to stay hydrated, just don't drink a lot of fluids with your meals.  This can make your stomach fill up faster making you feel sick  Do not brush your teeth right after you eat  Products with real ginger are good for nausea, like ginger ale and ginger hard candy Make sure it says made with real ginger!  Sucking on sour candy like lemon heads is also good for nausea  If your prenatal vitamins make you nauseated, take them at night so you will sleep through the nausea  Sea Bands  If you feel like you need medicine for the nausea & vomiting please let us know  If you are unable to keep any fluids or food down please let us know   Constipation  Drink plenty of fluid, preferably water, throughout the day  Eat foods high in fiber such as fruits, vegetables, and grains  Exercise, such as walking, is a good way to keep your bowels regular  Drink warm fluids, especially warm prune juice, or decaf coffee  Eat a 1/2 cup of real oatmeal (not instant), 1/2 cup applesauce, and 1/2-1 cup warm prune juice every day  If needed, you may take Colace (docusate sodium) stool softener once or twice a day to help keep the stool soft. If you are pregnant, wait until you are out of your first trimester (12-14 weeks of pregnancy)  If you still are having problems with constipation, you may take Miralax once daily as needed to help keep your bowels regular.  If you are pregnant, wait until you are out of your first trimester (12-14 weeks of pregnancy)   First Trimester of Pregnancy The first trimester of pregnancy is from week 1  until the end of week 12 (months 1 through 3). A week after a sperm fertilizes an egg, the egg will implant on the wall of the uterus. This embryo will begin to develop into a baby. Genes from you and your partner are forming the baby. The female genes determine whether the baby is a boy or a girl. At 6-8 weeks, the eyes and face are formed, and the heartbeat can be seen on ultrasound. At the end of 12 weeks, all the baby's organs are formed.  Now that you are pregnant, you will want to do everything you can to have a healthy baby. Two of the most important things are to get good prenatal care and to follow your health care provider's instructions. Prenatal care is all the medical care you receive before the baby's birth. This care will help prevent, find, and treat any problems during the pregnancy and childbirth. BODY CHANGES Your body goes through many changes during pregnancy. The changes vary from woman to woman.   You may gain or lose a couple of pounds at first.  You may feel sick to your stomach (nauseous) and throw up (vomit). If the vomiting is uncontrollable, call your health care provider.  You may tire easily.  You may develop headaches that can be relieved by medicines approved by your health care provider.  You may  urinate more often. Painful urination may mean you have a bladder infection.  You may develop heartburn as a result of your pregnancy.  You may develop constipation because certain hormones are causing the muscles that push waste through your intestines to slow down.  You may develop hemorrhoids or swollen, bulging veins (varicose veins).  Your breasts may begin to grow larger and become tender. Your nipples may stick out more, and the tissue that surrounds them (areola) may become darker.  Your gums may bleed and may be sensitive to brushing and flossing.  Dark spots or blotches (chloasma, mask of pregnancy) may develop on your face. This will likely fade after the  baby is born.  Your menstrual periods will stop.  You may have a loss of appetite.  You may develop cravings for certain kinds of food.  You may have changes in your emotions from day to day, such as being excited to be pregnant or being concerned that something may go wrong with the pregnancy and baby.  You may have more vivid and strange dreams.  You may have changes in your hair. These can include thickening of your hair, rapid growth, and changes in texture. Some women also have hair loss during or after pregnancy, or hair that feels dry or thin. Your hair will most likely return to normal after your baby is born. WHAT TO EXPECT AT YOUR PRENATAL VISITS During a routine prenatal visit:  You will be weighed to make sure you and the baby are growing normally.  Your blood pressure will be taken.  Your abdomen will be measured to track your baby's growth.  The fetal heartbeat will be listened to starting around week 10 or 12 of your pregnancy.  Test results from any previous visits will be discussed. Your health care provider may ask you:  How you are feeling.  If you are feeling the baby move.  If you have had any abnormal symptoms, such as leaking fluid, bleeding, severe headaches, or abdominal cramping.  If you have any questions. Other tests that may be performed during your first trimester include:  Blood tests to find your blood type and to check for the presence of any previous infections. They will also be used to check for low iron levels (anemia) and Rh antibodies. Later in the pregnancy, blood tests for diabetes will be done along with other tests if problems develop.  Urine tests to check for infections, diabetes, or protein in the urine.  An ultrasound to confirm the proper growth and development of the baby.  An amniocentesis to check for possible genetic problems.  Fetal screens for spina bifida and Down syndrome.  You may need other tests to make sure you  and the baby are doing well. HOME CARE INSTRUCTIONS  Medicines  Follow your health care provider's instructions regarding medicine use. Specific medicines may be either safe or unsafe to take during pregnancy.  Take your prenatal vitamins as directed.  If you develop constipation, try taking a stool softener if your health care provider approves. Diet  Eat regular, well-balanced meals. Choose a variety of foods, such as meat or vegetable-based protein, fish, milk and low-fat dairy products, vegetables, fruits, and whole grain breads and cereals. Your health care provider will help you determine the amount of weight gain that is right for you.  Avoid raw meat and uncooked cheese. These carry germs that can cause birth defects in the baby.  Eating four or five small meals rather than  three large meals a day may help relieve nausea and vomiting. If you start to feel nauseous, eating a few soda crackers can be helpful. Drinking liquids between meals instead of during meals also seems to help nausea and vomiting.  If you develop constipation, eat more high-fiber foods, such as fresh vegetables or fruit and whole grains. Drink enough fluids to keep your urine clear or pale yellow. Activity and Exercise  Exercise only as directed by your health care provider. Exercising will help you:  Control your weight.  Stay in shape.  Be prepared for labor and delivery.  Experiencing pain or cramping in the lower abdomen or low back is a good sign that you should stop exercising. Check with your health care provider before continuing normal exercises.  Try to avoid standing for long periods of time. Move your legs often if you must stand in one place for a long time.  Avoid heavy lifting.  Wear low-heeled shoes, and practice good posture.  You may continue to have sex unless your health care provider directs you otherwise. Relief of Pain or Discomfort  Wear a good support bra for breast  tenderness.   Take warm sitz baths to soothe any pain or discomfort caused by hemorrhoids. Use hemorrhoid cream if your health care provider approves.   Rest with your legs elevated if you have leg cramps or low back pain.  If you develop varicose veins in your legs, wear support hose. Elevate your feet for 15 minutes, 3-4 times a day. Limit salt in your diet. Prenatal Care  Schedule your prenatal visits by the twelfth week of pregnancy. They are usually scheduled monthly at first, then more often in the last 2 months before delivery.  Write down your questions. Take them to your prenatal visits.  Keep all your prenatal visits as directed by your health care provider. Safety  Wear your seat belt at all times when driving.  Make a list of emergency phone numbers, including numbers for family, friends, the hospital, and police and fire departments. General Tips  Ask your health care provider for a referral to a local prenatal education class. Begin classes no later than at the beginning of month 6 of your pregnancy.  Ask for help if you have counseling or nutritional needs during pregnancy. Your health care provider can offer advice or refer you to specialists for help with various needs.  Do not use hot tubs, steam rooms, or saunas.  Do not douche or use tampons or scented sanitary pads.  Do not cross your legs for long periods of time.  Avoid cat litter boxes and soil used by cats. These carry germs that can cause birth defects in the baby and possibly loss of the fetus by miscarriage or stillbirth.  Avoid all smoking, herbs, alcohol, and medicines not prescribed by your health care provider. Chemicals in these affect the formation and growth of the baby.  Schedule a dentist appointment. At home, brush your teeth with a soft toothbrush and be gentle when you floss. SEEK MEDICAL CARE IF:   You have dizziness.  You have mild pelvic cramps, pelvic pressure, or nagging pain in  the abdominal area.  You have persistent nausea, vomiting, or diarrhea.  You have a bad smelling vaginal discharge.  You have pain with urination.  You notice increased swelling in your face, hands, legs, or ankles. SEEK IMMEDIATE MEDICAL CARE IF:   You have a fever.  You are leaking fluid from your vagina.  You have spotting or bleeding from your vagina.  You have severe abdominal cramping or pain.  You have rapid weight gain or loss.  You vomit blood or material that looks like coffee grounds.  You are exposed to [place] measles and have never had them.  You are exposed to fifth disease or chickenpox.  You develop a severe headache.  You have shortness of breath.  You have any kind of trauma, such as from a fall or a car accident. Document Released: 05/03/2001 Document Revised: 09/23/2013 Document Reviewed: 03/19/2013 [facility] Patient Information 2015 [place], [place]. This information is not intended to replace advice given to you by your health care provider. Make sure you discuss any questions you have with your health care provider.

## 2016-08-24 NOTE — Progress Notes (Signed)
Subjective:  Debbie Young is a 46 y.o. [phone] Caucasian female at [redacted]w[redacted]d by LMP c/w 8wk u/s, being seen today for her first obstetrical visit.  Her obstetrical history is significant for smoker: 1-2ppd prior to pregnancy, now 1/2ppd, h/o 36.5wk PTB via c/s d/t PPROM and breech, sab x 2, AMA @ 46yo.  Pregnancy history fully reviewed. H/O LEEP after last baby  Patient reports some nausea- declines meds, constipation. Denies vb, cramping, uti s/s, abnormal/malodorous vag d/c, or vulvovaginal itching/irritation.  BP 120/88   Wt 208 lb (94.3 kg)   LMP 06/08/2016 (Approximate)   BMI 35.43 kg/m   HISTORY: OB History  Gravida Para Term Preterm AB Living  4 1 0 1 2 1   SAB TAB Ectopic Multiple Live Births  2       1    # Outcome Date GA Lbr Len/2nd Weight Sex Delivery Anes PTL Lv  4 Current           3 SAB 2016             Birth Comments: System Generated. Please review and update pregnancy details.  2 SAB 08/02/13          1 Preterm 04/20/01 [redacted]w[redacted]d  7 lb (3.175 kg) M CS-LVertical Spinal Y LIV     Past Medical History:  Diagnosis Date  . Anxiety   . Cellulitis 04/2001   LEFT FOOT  . Hematuria    BENIGN--SAW UROLOGIST  . History of abnormal cervical Pap smear 04/30/2015  . LGSIL (low grade squamous intraepithelial dysplasia) 1997  . Miscarriage 08/02/2013  . Patient desires pregnancy 11/18/2013  . Seizure disorder ([place])    AS A CHILD--NO MEDS SINCE 12 YO  . Smoker   . Vaginal Pap smear, abnormal    Past Surgical History:  Procedure Laterality Date  . [place]  . CERVICAL BIOPSY  W/ LOOP ELECTRODE EXCISION  07/2002  . CESAREAN SECTION    . CHOLECYSTECTOMY    . COLPOSCOPY    . REFRACTIVE SURGERY  2000  . TONSILLECTOMY AND ADENOIDECTOMY     Family History  Problem Relation Age of Onset  . Breast cancer Mother 87  . Fibromyalgia Mother   . Breast cancer Maternal Aunt   . Breast cancer Maternal Grandmother   . Heart disease Maternal Grandmother   . Anuerysm  Maternal Grandmother   . Heart failure Maternal Grandmother   . Hypertension Maternal Grandfather   . Stroke Maternal Grandfather   . Heart disease Paternal Grandmother   . Heart disease Paternal Grandfather   . Cancer Father     Myloma and [place] water   . Asthma Son     Exam   System:     General: Well developed & nourished, no acute distress   Skin: Warm & dry, normal coloration and turgor, no rashes   Neurologic: Alert & oriented, normal mood   Cardiovascular: Regular rate & rhythm   Respiratory: Effort & rate normal, LCTAB, acyanotic   Abdomen: Soft, non tender   Extremities: normal strength, tone  Thin prep pap smear neg 04/30/2015  FHR: 175 via doppler   Assessment:   Pregnancy: [id number] Patient Active Problem List   Diagnosis Date Noted  . Encounter for supervision of other normal pregnancy 08/24/2016  . History of cesarean delivery 08/24/2016  . History of preterm delivery 08/24/2016  . History of abnormal cervical Pap smear 04/30/2015  . Miscarriage 08/02/2013  . LGSIL (low grade squamous  intraepithelial dysplasia)   . Cellulitis   . Seizure disorder ([place])   . Hematuria   . Smoker     [redacted]w[redacted]d [id number] New OB visit AMA @ 46yo Prev c/s d/t breech Prev ptb @ 36.5wks d/t PPROM Smoker H/O LEEP  Plan:  Initial labs obtained Continue prenatal vitamins Problem list reviewed and updated Reviewed n/v relief measures and warning s/s to report Reviewed recommended weight gain based on pre-gravid BMI Encouraged well-balanced diet Genetic Screening discussed: discussed increased risk d/t AMA, offered Informaseq- pt declines, just wants to do nt/it, if abnormal will do informaseq Cystic fibrosis screening discussed declined Ultrasound discussed; fetal survey: requested Follow up in 1 weeks for 1st it/nt (no visit), then 4wks for visit and 2nd IT CCNC completed, not sent d/t not applying for preg mcaid Baby ASA @ 12wks d/t AMA Discussed VBAC,  declines, wants repeat C/S w/ BTL Offered Makena, interested but wants to discuss w/ husband, will let us know Smokes 1/2pp/day, advised cessation, discussed risks to fetus while pregnant, to infant pp, and to herself. Offered QuitlineNC, declined  Tawnya Crook CNM, [facility] 08/24/2016 11:48 AM

## 2016-08-25 LAB — VARICELLA ZOSTER ANTIBODY, IGG: Varicella zoster IgG: 2864 index (ref >165)

## 2016-08-25 LAB — URINALYSIS, ROUTINE W REFLEX MICROSCOPIC
Bilirubin, UA: NEGATIVE
GLUCOSE, UA: NEGATIVE
Ketones, UA: NEGATIVE
Leukocytes, UA: NEGATIVE
NITRITE UA: NEGATIVE
Protein, UA: NEGATIVE
Specific Gravity, UA: 1.008 (ref 1.005–1.030)
Urobilinogen, Ur: 0.2 mg/dL (ref 0.2–1.0)
pH, UA: 7 (ref 5.0–7.5)

## 2016-08-25 LAB — RUBELLA SCREEN: Rubella Antibodies, IGG: 2.95 index (ref >0.99)

## 2016-08-25 LAB — RPR: RPR: NONREACTIVE

## 2016-08-25 LAB — ANTIBODY SCREEN: ANTIBODY SCREEN: NEGATIVE

## 2016-08-25 LAB — PMP SCREEN PROFILE (10S), URINE
AMPHETAMINE SCRN UR: NEGATIVE ng/mL
BENZODIAZEPINE SCREEN, URINE: NEGATIVE ng/mL
Barbiturate Screen, Ur: NEGATIVE ng/mL
CREATININE(CRT), U: 31.9 mg/dL (ref 20.0–300.0)
Cannabinoids Ur Ql Scn: NEGATIVE ng/mL
Cocaine(Metab.)Screen, Urine: NEGATIVE ng/mL
METHADONE SCREEN, URINE: NEGATIVE ng/mL
Opiate Scrn, Ur: NEGATIVE ng/mL
Oxycodone+Oxymorphone Ur Ql Scn: NEGATIVE ng/mL
PCP SCRN UR: NEGATIVE ng/mL
PH UR, DRUG SCRN: 7.2 (ref 4.5–8.9)
Propoxyphene, Screen: NEGATIVE ng/mL

## 2016-08-25 LAB — HEPATITIS B SURFACE ANTIGEN: HEP B S AG: NEGATIVE

## 2016-08-25 LAB — MICROSCOPIC EXAMINATION
Bacteria, UA: NONE SEEN
Casts: NONE SEEN /lpf
Epithelial Cells (non renal): NONE SEEN /hpf (ref 0–10)

## 2016-08-25 LAB — ABO/RH: RH TYPE: POSITIVE

## 2016-08-25 LAB — HIV ANTIBODY (ROUTINE TESTING W REFLEX): HIV Screen 4th Generation wRfx: NONREACTIVE

## 2016-08-26 LAB — URINE CULTURE

## 2016-08-26 LAB — GC/CHLAMYDIA PROBE AMP
Chlamydia trachomatis, NAA: NEGATIVE
NEISSERIA GONORRHOEAE BY PCR: NEGATIVE

## 2016-08-29 ENCOUNTER — Other Ambulatory Visit: Payer: Commercial Managed Care - HMO

## 2016-08-29 ENCOUNTER — Ambulatory Visit (INDEPENDENT_AMBULATORY_CARE_PROVIDER_SITE_OTHER): Payer: Commercial Managed Care - HMO

## 2016-08-29 DIAGNOSIS — Z3682 Encounter for antenatal screening for nuchal translucency: Secondary | ICD-10-CM

## 2016-08-29 DIAGNOSIS — O0990 Supervision of high risk pregnancy, unspecified, unspecified trimester: Secondary | ICD-10-CM

## 2016-08-29 NOTE — Progress Notes (Signed)
[place] 11+5 wks,measurements c/w dates,normal ovaries bilat,NB present,NT 1 mm,fhr 152 bpm,crl 49.72 mm

## 2016-08-31 LAB — MATERNAL SCREEN, INTEGRATED #1
CROWN RUMP LENGTH MAT SCREEN: 49.7 mm
Gest. Age on Collection Date: 11.7 weeks
Maternal Age at EDD: 44.6 yr
Nuchal Translucency (NT): 1 mm
Number of Fetuses: 1
PAPP-A Value: 350.8 ng/mL
Weight: 206 [lb_av]

## 2016-09-21 ENCOUNTER — Encounter: Payer: Self-pay | Admitting: Advanced Practice Midwife

## 2016-09-21 ENCOUNTER — Ambulatory Visit (INDEPENDENT_AMBULATORY_CARE_PROVIDER_SITE_OTHER): Payer: Commercial Managed Care - HMO | Admitting: Advanced Practice Midwife

## 2016-09-21 VITALS — BP 110/80 | HR 80 | Wt 211.0 lb

## 2016-09-21 DIAGNOSIS — Z1389 Encounter for screening for other disorder: Secondary | ICD-10-CM

## 2016-09-21 DIAGNOSIS — Z363 Encounter for antenatal screening for malformations: Secondary | ICD-10-CM

## 2016-09-21 DIAGNOSIS — O09522 Supervision of elderly multigravida, second trimester: Secondary | ICD-10-CM

## 2016-09-21 DIAGNOSIS — Z3482 Encounter for supervision of other normal pregnancy, second trimester: Secondary | ICD-10-CM

## 2016-09-21 DIAGNOSIS — Z331 Pregnant state, incidental: Secondary | ICD-10-CM

## 2016-09-21 LAB — POCT URINALYSIS DIPSTICK
Glucose, UA: NEGATIVE
KETONES UA: NEGATIVE
Leukocytes, UA: NEGATIVE
Nitrite, UA: NEGATIVE
PROTEIN UA: NEGATIVE
RBC UA: 2

## 2016-09-21 NOTE — Patient Instructions (Signed)

## 2016-09-21 NOTE — Progress Notes (Signed)
Fetal Surveillance Testing today:  doppler   High Risk Pregnancy Diagnosis(es):   AMA  [id number] [redacted]w[redacted]d Estimated Date of Delivery: 03/15/17  Blood pressure 110/80, pulse 80, weight 211 lb (95.7 kg), last menstrual period 06/08/2016.  Urinalysis: Negative   HPI: The patient is being seen today for ongoing management of the above. Today she reports no complaints   BP weight and urine results all reviewed and noted. Patient denies any bleeding and no rupture of membranes symptoms or regular contractions.   Fetal Heart rate:  150 Edema:  no  Patient is without complaints other than noted in her HPI. All questions were answered. Declined 17p  All lab and sonogram results have been reviewed. Comments:    Assessment:  1.  Pregnancy at [redacted]w[redacted]d,  Estimated Date of Delivery: 03/15/17 :                          2.  AMA                        3.    Medication(s) Plans:  Diona Fanti 81mg   Treatment Plan:   U/S @ 20, 24, 28, 32, 36wks    2x/wk testing nst/sono @ 36wks    Return in about 4 weeks (around 10/19/2016) for HROB, [id number]. for appointment for high risk OB care  No orders of the defined types were placed in this encounter.  Orders Placed This Encounter  Procedures  . US OB Comp + 14 Wk  . POCT urinalysis dipstick

## 2016-10-19 ENCOUNTER — Encounter: Payer: Self-pay | Admitting: Obstetrics and Gynecology

## 2016-10-19 ENCOUNTER — Ambulatory Visit (INDEPENDENT_AMBULATORY_CARE_PROVIDER_SITE_OTHER): Payer: Commercial Managed Care - HMO

## 2016-10-19 ENCOUNTER — Ambulatory Visit (INDEPENDENT_AMBULATORY_CARE_PROVIDER_SITE_OTHER): Payer: Commercial Managed Care - HMO | Admitting: Women's Health

## 2016-10-19 VITALS — BP 112/70 | HR 68 | Wt 219.0 lb

## 2016-10-19 DIAGNOSIS — Z3682 Encounter for antenatal screening for nuchal translucency: Secondary | ICD-10-CM

## 2016-10-19 DIAGNOSIS — Z1389 Encounter for screening for other disorder: Secondary | ICD-10-CM

## 2016-10-19 DIAGNOSIS — Z8751 Personal history of pre-term labor: Secondary | ICD-10-CM

## 2016-10-19 DIAGNOSIS — Z363 Encounter for antenatal screening for malformations: Secondary | ICD-10-CM | POA: Diagnosis not present

## 2016-10-19 DIAGNOSIS — O0992 Supervision of high risk pregnancy, unspecified, second trimester: Secondary | ICD-10-CM

## 2016-10-19 DIAGNOSIS — O09522 Supervision of elderly multigravida, second trimester: Secondary | ICD-10-CM

## 2016-10-19 DIAGNOSIS — O0990 Supervision of high risk pregnancy, unspecified, unspecified trimester: Secondary | ICD-10-CM

## 2016-10-19 DIAGNOSIS — Z331 Pregnant state, incidental: Secondary | ICD-10-CM

## 2016-10-19 LAB — POCT URINALYSIS DIPSTICK
Blood, UA: NEGATIVE
GLUCOSE UA: NEGATIVE
KETONES UA: NEGATIVE
LEUKOCYTES UA: NEGATIVE
Nitrite, UA: NEGATIVE
Protein, UA: NEGATIVE

## 2016-10-19 NOTE — Progress Notes (Signed)
High Risk Pregnancy Diagnosis(es): AMA [id number] [redacted]w[redacted]d Estimated Date of Delivery: 03/15/17 BP 112/70   Pulse 68   Wt 219 lb (99.3 kg)   LMP 06/08/2016 (Approximate)   BMI 37.30 kg/m   Urinalysis: Negative HPI:  Doing well BP, weight, and urine reviewed.  Reports good fm. Denies regular uc's, lof, vb, uti s/s. No complaints.  Fundal Height:  19wks Fetal Heart rate:  138 via u/s Edema:  Tr LLE  Reviewed today's normal anatomy u/s, warning s/s to report, fm All questions were answered Assessment: [redacted]w[redacted]d AMA Medication(s) Plans:  Baby asa Treatment Plan:  U/S @ 24, 28, 32, 36wks    2x/wk testing nst/sono @ 36wks    Deliver @ 40wks Follow up in 4wks for high-risk OB appt and efw/afi u/s 2nd IT today

## 2016-10-19 NOTE — Progress Notes (Signed)
[place] 19 wks,breech,cx 4.8 cm,ant fundal pl gr 0,normal ovaries bilat,svp of fluid 4 cm,fhr 138 bpm,efw 280 g, anatomy complete,no obvious abnormalities seen

## 2016-10-19 NOTE — Patient Instructions (Addendum)
[place] Pediatricians/Family Doctors:  [facility] [place] [phone]                 [place] [phone] (usually not accepting new patients unless you have family there already, you are always welcome to call and ask)            [facility] Pediatricians/Family Doctors:   [facility]: [phone]  [facility]: [phone]   Second Trimester of Pregnancy The second trimester is from week 14 through week 27 (months 4 through 6). The second trimester is often a time when you feel your best. Your body has adjusted to being pregnant, and you begin to feel better physically. Usually, morning sickness has lessened or quit completely, you may have more energy, and you may have an increase in appetite. The second trimester is also a time when the fetus is growing rapidly. At the end of the sixth month, the fetus is about 9 inches long and weighs about 1 pounds. You will likely begin to feel the baby move (quickening) between 16 and 20 weeks of pregnancy. Body changes during your second trimester Your body continues to go through many changes during your second trimester. The changes vary from woman to woman.  Your weight will continue to increase. You will notice your lower abdomen bulging out.  You may begin to get stretch marks on your hips, abdomen, and breasts.  You may develop headaches that can be relieved by medicines. The medicines should be approved by your health care provider.  You may urinate more often because the fetus is pressing on your bladder.  You may develop or continue to have heartburn as a result of your pregnancy.  You may develop constipation because certain hormones are causing the muscles that push waste through your intestines to slow down.  You may develop hemorrhoids or swollen, bulging veins (varicose veins).  You may have back pain. This is caused by:  Weight  gain.  Pregnancy hormones that are relaxing the joints in your pelvis.  A shift in weight and the muscles that support your balance.  Your breasts will continue to grow and they will continue to become tender.  Your gums may bleed and may be sensitive to brushing and flossing.  Dark spots or blotches (chloasma, mask of pregnancy) may develop on your face. This will likely fade after the baby is born.  A dark line from your belly button to the pubic area (linea nigra) may appear. This will likely fade after the baby is born.  You may have changes in your hair. These can include thickening of your hair, rapid growth, and changes in texture. Some women also have hair loss during or after pregnancy, or hair that feels dry or thin. Your hair will most likely return to normal after your baby is born. What to expect at prenatal visits During a routine prenatal visit:  You will be weighed to make sure you and the fetus are growing normally.  Your blood pressure will be taken.  Your abdomen will be measured to track your baby's growth.  The fetal heartbeat will be listened to.  Any test results from the previous visit will be discussed. Your health care provider may ask you:  How you are feeling.  If you are feeling the baby move.  If you have had any abnormal symptoms, such as leaking fluid, bleeding, severe headaches, or abdominal  cramping.  If you are using any tobacco products, including cigarettes, chewing tobacco, and electronic cigarettes.  If you have any questions. Other tests that may be performed during your second trimester include:  Blood tests that check for:  Low iron levels (anemia).  High blood sugar that affects pregnant women (gestational diabetes) between 33 and 28 weeks.  Rh antibodies. This is to check for a protein on red blood cells (Rh factor).  Urine tests to check for infections, diabetes, or protein in the urine.  An ultrasound to confirm the  proper growth and development of the baby.  An amniocentesis to check for possible genetic problems.  Fetal screens for spina bifida and Down syndrome.  HIV (human immunodeficiency virus) testing. Routine prenatal testing includes screening for HIV, unless you choose not to have this test. Follow these instructions at home: Medicines   Follow your health care provider's instructions regarding medicine use. Specific medicines may be either safe or unsafe to take during pregnancy.  Take a prenatal vitamin that contains at least 600 micrograms (mcg) of folic acid.  If you develop constipation, try taking a stool softener if your health care provider approves. Eating and drinking   Eat a balanced diet that includes fresh fruits and vegetables, whole grains, good sources of protein such as meat, eggs, or tofu, and low-fat dairy. Your health care provider will help you determine the amount of weight gain that is right for you.  Avoid raw meat and uncooked cheese. These carry germs that can cause birth defects in the baby.  If you have low calcium intake from food, talk to your health care provider about whether you should take a daily calcium supplement.  Limit foods that are high in fat and processed sugars, such as fried and sweet foods.  To prevent constipation:  Drink enough fluid to keep your urine clear or pale yellow.  Eat foods that are high in fiber, such as fresh fruits and vegetables, whole grains, and beans. Activity   Exercise only as directed by your health care provider. Most women can continue their usual exercise routine during pregnancy. Try to exercise for 30 minutes at least 5 days a week. Stop exercising if you experience uterine contractions.  Avoid heavy lifting, wear low heel shoes, and practice good posture.  A sexual relationship may be continued unless your health care provider directs you otherwise. Relieving pain and discomfort   Wear a good support bra  to prevent discomfort from breast tenderness.  Take warm sitz baths to soothe any pain or discomfort caused by hemorrhoids. Use hemorrhoid cream if your health care provider approves.  Rest with your legs elevated if you have leg cramps or low back pain.  If you develop varicose veins, wear support hose. Elevate your feet for 15 minutes, 3-4 times a day. Limit salt in your diet. Prenatal Care   Write down your questions. Take them to your prenatal visits.  Keep all your prenatal visits as told by your health care provider. This is important. Safety   Wear your seat belt at all times when driving.  Make a list of emergency phone numbers, including numbers for family, friends, the hospital, and police and fire departments. General instructions   Ask your health care provider for a referral to a local prenatal education class. Begin classes no later than the beginning of month 6 of your pregnancy.  Ask for help if you have counseling or nutritional needs during pregnancy. Your health care provider  can offer advice or refer you to specialists for help with various needs.  Do not use hot tubs, steam rooms, or saunas.  Do not douche or use tampons or scented sanitary pads.  Do not cross your legs for long periods of time.  Avoid cat litter boxes and soil used by cats. These carry germs that can cause birth defects in the baby and possibly loss of the fetus by miscarriage or stillbirth.  Avoid all smoking, herbs, alcohol, and unprescribed drugs. Chemicals in these products can affect the formation and growth of the baby.  Do not use any products that contain nicotine or tobacco, such as cigarettes and e-cigarettes. If you need help quitting, ask your health care provider.  Visit your dentist if you have not gone yet during your pregnancy. Use a soft toothbrush to brush your teeth and be gentle when you floss. Contact a health care provider if:  You have dizziness.  You have mild  pelvic cramps, pelvic pressure, or nagging pain in the abdominal area.  You have persistent nausea, vomiting, or diarrhea.  You have a bad smelling vaginal discharge.  You have pain when you urinate. Get help right away if:  You have a fever.  You are leaking fluid from your vagina.  You have spotting or bleeding from your vagina.  You have severe abdominal cramping or pain.  You have rapid weight gain or weight loss.  You have shortness of breath with chest pain.  You notice sudden or extreme swelling of your face, hands, ankles, feet, or legs.  You have not felt your baby move in over an hour.  You have severe headaches that do not go away when you take medicine.  You have vision changes. Summary  The second trimester is from week 14 through week 27 (months 4 through 6). It is also a time when the fetus is growing rapidly.  Your body goes through many changes during pregnancy. The changes vary from woman to woman.  Avoid all smoking, herbs, alcohol, and unprescribed drugs. These chemicals affect the formation and growth your baby.  Do not use any tobacco products, such as cigarettes, chewing tobacco, and e-cigarettes. If you need help quitting, ask your health care provider.  Contact your health care provider if you have any questions. Keep all prenatal visits as told by your health care provider. This is important. This information is not intended to replace advice given to you by your health care provider. Make sure you discuss any questions you have with your health care provider. Document Released: 05/03/2001 Document Revised: 10/15/2015 Document Reviewed: 07/10/2012 Elsevier Interactive Patient Education  2017 [employer].

## 2016-10-22 LAB — MATERNAL SCREEN, INTEGRATED #2
AFP MoM: 0.64
Alpha-Fetoprotein: 22 ng/mL
Crown Rump Length: 49.7 mm
DIA MoM: 1.24
DIA Value: 177.1 pg/mL
Estriol, Unconjugated: 1.2 ng/mL
Gest. Age on Collection Date: 11.7 weeks
Gestational Age: 19 weeks
Maternal Age at EDD: 44.6 yr
Nuchal Translucency (NT): 1 mm
Nuchal Translucency MoM: 0.74
Number of Fetuses: 1
PAPP-A MoM: 0.75
PAPP-A Value: 350.8 ng/mL
Test Results:: NEGATIVE
Weight: 206 [lb_av]
Weight: 219 [lb_av]
hCG MoM: 0.66
hCG Value: 11.3 IU/mL
uE3 MoM: 0.91

## 2016-11-16 ENCOUNTER — Ambulatory Visit (INDEPENDENT_AMBULATORY_CARE_PROVIDER_SITE_OTHER): Payer: 59

## 2016-11-16 ENCOUNTER — Ambulatory Visit (INDEPENDENT_AMBULATORY_CARE_PROVIDER_SITE_OTHER): Payer: 59 | Admitting: Advanced Practice Midwife

## 2016-11-16 ENCOUNTER — Encounter: Payer: Self-pay | Admitting: Advanced Practice Midwife

## 2016-11-16 VITALS — BP 110/80 | HR 78 | Wt 225.0 lb

## 2016-11-16 DIAGNOSIS — O09522 Supervision of elderly multigravida, second trimester: Secondary | ICD-10-CM | POA: Diagnosis not present

## 2016-11-16 DIAGNOSIS — Z1389 Encounter for screening for other disorder: Secondary | ICD-10-CM

## 2016-11-16 DIAGNOSIS — Z331 Pregnant state, incidental: Secondary | ICD-10-CM

## 2016-11-16 DIAGNOSIS — O0990 Supervision of high risk pregnancy, unspecified, unspecified trimester: Secondary | ICD-10-CM

## 2016-11-16 DIAGNOSIS — O0992 Supervision of high risk pregnancy, unspecified, second trimester: Secondary | ICD-10-CM

## 2016-11-16 LAB — POCT URINALYSIS DIPSTICK
GLUCOSE UA: NEGATIVE
Ketones, UA: NEGATIVE
Leukocytes, UA: NEGATIVE
NITRITE UA: NEGATIVE
RBC UA: 3

## 2016-11-16 NOTE — Progress Notes (Signed)
[id number] [redacted]w[redacted]d Estimated Date of Delivery: 03/15/17  Blood pressure 110/80, pulse 78, weight 225 lb (102.1 kg), last menstrual period 06/08/2016.   BP weight and urine results all reviewed and noted.  [place] 23 wks,frank breech,fundal pl gr 0,normal ovaries bilat,cx 5.2 cm,fhr 152 bpm,svp of fluid 6 cm,EFW 603 g 53%  Please refer to the obstetrical flow sheet for the fundal height and fetal heart rate documentation:  Patient reports good fetal movement, denies any bleeding and no rupture of membranes symptoms or regular contractions. Patient is without complaints. All questions were answered.  Orders Placed This Encounter  Procedures  . US OB Follow Up    Plan:  Continued routine obstetrical care,   Return in about 4 weeks (around 12/14/2016) for HROB, PN2, US:EFW.

## 2016-11-16 NOTE — Progress Notes (Signed)
[place] 23 wks,frank breech,fundal pl gr 0,normal ovaries bilat,cx 5.2 cm,fhr 152 bpm,svp of fluid 6 cm,EFW 603 g 53%

## 2016-11-16 NOTE — Patient Instructions (Signed)

## 2016-12-14 ENCOUNTER — Ambulatory Visit (INDEPENDENT_AMBULATORY_CARE_PROVIDER_SITE_OTHER): Payer: 59 | Admitting: Obstetrics and Gynecology

## 2016-12-14 ENCOUNTER — Encounter: Payer: Self-pay | Admitting: Obstetrics and Gynecology

## 2016-12-14 ENCOUNTER — Other Ambulatory Visit: Payer: 59

## 2016-12-14 ENCOUNTER — Ambulatory Visit (INDEPENDENT_AMBULATORY_CARE_PROVIDER_SITE_OTHER): Payer: 59

## 2016-12-14 VITALS — BP 120/84 | HR 80 | Wt 234.0 lb

## 2016-12-14 DIAGNOSIS — Z98891 History of uterine scar from previous surgery: Secondary | ICD-10-CM

## 2016-12-14 DIAGNOSIS — O0992 Supervision of high risk pregnancy, unspecified, second trimester: Secondary | ICD-10-CM

## 2016-12-14 DIAGNOSIS — Z331 Pregnant state, incidental: Secondary | ICD-10-CM

## 2016-12-14 DIAGNOSIS — R319 Hematuria, unspecified: Secondary | ICD-10-CM

## 2016-12-14 DIAGNOSIS — Z3A27 27 weeks gestation of pregnancy: Secondary | ICD-10-CM

## 2016-12-14 DIAGNOSIS — Z1389 Encounter for screening for other disorder: Secondary | ICD-10-CM

## 2016-12-14 DIAGNOSIS — O09522 Supervision of elderly multigravida, second trimester: Secondary | ICD-10-CM | POA: Diagnosis not present

## 2016-12-14 DIAGNOSIS — O0990 Supervision of high risk pregnancy, unspecified, unspecified trimester: Secondary | ICD-10-CM

## 2016-12-14 DIAGNOSIS — O09523 Supervision of elderly multigravida, third trimester: Secondary | ICD-10-CM

## 2016-12-14 DIAGNOSIS — Z131 Encounter for screening for diabetes mellitus: Secondary | ICD-10-CM

## 2016-12-14 LAB — POCT URINALYSIS DIPSTICK
Glucose, UA: NEGATIVE
KETONES UA: NEGATIVE
Leukocytes, UA: NEGATIVE
Nitrite, UA: NEGATIVE
PROTEIN UA: NEGATIVE

## 2016-12-14 NOTE — Progress Notes (Signed)
[id number]  Estimated Date of Delivery: 03/15/17 LROB [redacted]w[redacted]d  Chief Complaint  Patient presents with  . High Risk Gestation    u/s & PN2 labs  ____  Patient complaints:none. Patient reports  good fetal movement,                           denies any bleeding , rupture of membranes,or regular contractions.  Blood pressure 120/84, pulse 80, weight 234 lb (106.1 kg), last menstrual period 06/08/2016.   Urine results:notable for neg protein and glucose  refer to the ob flow sheet for FH and FHR, ,                          Physical Examination: General appearance - alert, well appearing, and in no distress                                      Abdomen - FH 27 ,                                                         -FHR 145                                                                                               Pelvic -                                             Questions were answered. Assessment: LROB [id number] @ [redacted]w[redacted]d Prior cesarean for repeat C-section and tubal ligation to be scheduled for October 18 in the morning  Plan:  Continued routine obstetrical care, will schedule section for J VF the day post call on Thursday morning  F/u in 4 weeks for LR OB

## 2016-12-14 NOTE — Progress Notes (Signed)
[place] 27 wks,cephalic,ant pl gr 0,normal ovaries bilat,afi 15 cm,fhr 146 bpm,cx 4.6 cm,efw 1105 g 61%

## 2016-12-15 LAB — CBC
HEMATOCRIT: 33 % — AB (ref 34.0–46.6)
Hemoglobin: 11.1 g/dL (ref 11.1–15.9)
MCH: 30.8 pg (ref 26.6–33.0)
MCHC: 33.6 g/dL (ref 31.5–35.7)
MCV: 92 fL (ref 79–97)
PLATELETS: 200 10*3/uL (ref 150–379)
RBC: 3.6 x10E6/uL — ABNORMAL LOW (ref 3.77–5.28)
RDW: 13.1 % (ref 12.3–15.4)
WBC: 7.4 10*3/uL (ref 3.4–10.8)

## 2016-12-15 LAB — RPR: RPR Ser Ql: NONREACTIVE

## 2016-12-15 LAB — GLUCOSE TOLERANCE, 2 HOURS W/ 1HR
GLUCOSE, 1 HOUR: 156 mg/dL (ref 65–179)
GLUCOSE, 2 HOUR: 132 mg/dL (ref 65–152)
Glucose, Fasting: 83 mg/dL (ref 65–91)

## 2016-12-15 LAB — HIV ANTIBODY (ROUTINE TESTING W REFLEX): HIV SCREEN 4TH GENERATION: NONREACTIVE

## 2016-12-15 LAB — ANTIBODY SCREEN: Antibody Screen: NEGATIVE

## 2016-12-16 LAB — URINE CULTURE

## 2016-12-17 ENCOUNTER — Other Ambulatory Visit: Payer: Self-pay | Admitting: Obstetrics and Gynecology

## 2016-12-26 ENCOUNTER — Telehealth: Payer: Self-pay | Admitting: *Deleted

## 2016-12-26 ENCOUNTER — Other Ambulatory Visit: Payer: Self-pay | Admitting: Women's Health

## 2016-12-26 ENCOUNTER — Other Ambulatory Visit: Payer: 59

## 2016-12-26 DIAGNOSIS — R350 Frequency of micturition: Secondary | ICD-10-CM

## 2016-12-26 MED ORDER — NITROFURANTOIN MONOHYD MACRO 100 MG PO CAPS: 100.0000 mg | ORAL_CAPSULE | Freq: Two times a day (BID) | ORAL | 0 refills | Status: DC

## 2016-12-26 NOTE — Telephone Encounter (Signed)
Spoke with pt letting her know Macrobid was sent to pharmacy but she needs to come by office and leave urine for culture prior to starting med. Pt voiced understanding. Debbie Young

## 2016-12-26 NOTE — Telephone Encounter (Signed)
Pt called back to f/u on telephone message from today.  Pt informed to drop off urine today before starting medication.  Pt will come in late this afternoon.  12-26-16  AS

## 2016-12-26 NOTE — Telephone Encounter (Signed)
Pt called with following complaint per nurse:  "Yesterday pt started with + pressure in bladder, urinary frequency but just trickles, + cramps. No available appts today. Pt is at work in [place]. Please advise. Thanks!! JSY" Rx macrobid, to come leave urine for culture prior to starting meds.  Roma Schanz, CNM, WHNP-BC 12/26/2016 10:45 AM

## 2016-12-26 NOTE — Telephone Encounter (Signed)
Spoke with pt. Yesterday pt started with + pressure in bladder, urinary frequency but just trickles, + cramps. No available appts today. Pt is at work in [place]. Please advise. Thanks!! [place]

## 2016-12-29 ENCOUNTER — Telehealth: Payer: Self-pay | Admitting: *Deleted

## 2016-12-29 LAB — URINE CULTURE

## 2016-12-29 NOTE — Telephone Encounter (Signed)
Left message @ 5:17 pm. JSY

## 2016-12-30 NOTE — Telephone Encounter (Signed)
Spoke with pt letting her know urine culture was not done due to cup being empty. Not sure what happened there. Pt will come by Monday and leave another urine for culture. Pt voiced understanding. [place]

## 2017-01-02 ENCOUNTER — Other Ambulatory Visit: Payer: 59

## 2017-01-02 DIAGNOSIS — R350 Frequency of micturition: Secondary | ICD-10-CM

## 2017-01-04 ENCOUNTER — Telehealth: Payer: Self-pay | Admitting: *Deleted

## 2017-01-04 LAB — URINE CULTURE: ORGANISM ID, BACTERIA: NO GROWTH

## 2017-01-04 NOTE — Telephone Encounter (Signed)
Left message letting pt know urine culture showed no growth. [place]

## 2017-01-11 ENCOUNTER — Ambulatory Visit (INDEPENDENT_AMBULATORY_CARE_PROVIDER_SITE_OTHER): Payer: 59 | Admitting: Women's Health

## 2017-01-11 ENCOUNTER — Encounter: Payer: Self-pay | Admitting: Women's Health

## 2017-01-11 VITALS — BP 110/60 | HR 80 | Wt 239.0 lb

## 2017-01-11 DIAGNOSIS — Z3A31 31 weeks gestation of pregnancy: Secondary | ICD-10-CM

## 2017-01-11 DIAGNOSIS — O09523 Supervision of elderly multigravida, third trimester: Secondary | ICD-10-CM

## 2017-01-11 DIAGNOSIS — O09893 Supervision of other high risk pregnancies, third trimester: Secondary | ICD-10-CM

## 2017-01-11 DIAGNOSIS — Z1389 Encounter for screening for other disorder: Secondary | ICD-10-CM

## 2017-01-11 DIAGNOSIS — O0993 Supervision of high risk pregnancy, unspecified, third trimester: Secondary | ICD-10-CM

## 2017-01-11 DIAGNOSIS — O0990 Supervision of high risk pregnancy, unspecified, unspecified trimester: Secondary | ICD-10-CM

## 2017-01-11 DIAGNOSIS — Z331 Pregnant state, incidental: Secondary | ICD-10-CM

## 2017-01-11 LAB — POCT URINALYSIS DIPSTICK
Glucose, UA: NEGATIVE
KETONES UA: NEGATIVE
Leukocytes, UA: NEGATIVE
Nitrite, UA: NEGATIVE

## 2017-01-11 NOTE — Patient Instructions (Addendum)
Call the office [phone]) or go to [facility] if:  You begin to have strong, frequent contractions  Your water breaks.  Sometimes it is a big gush of fluid, sometimes it is just a trickle that keeps getting your panties wet or running down your legs  You have vaginal bleeding.  It is normal to have a small amount of spotting if your cervix was checked.   You don't feel your baby moving like normal.  If you don't, get you something to eat and drink and lay down and focus on feeling your baby move.  You should feel at least 10 movements in 2 hours.  If you don't, you should call the office or go to [facility].    Tdap Vaccine  It is recommended that you get the Tdap vaccine during the third trimester of EACH pregnancy to help protect your baby from getting pertussis (whooping cough)  27-36 weeks is the BEST time to do this so that you can pass the protection on to your baby. During pregnancy is better than after pregnancy, but if you are unable to get it during pregnancy it will be offered at the hospital.   You can get this vaccine at the health department or your family doctor  Everyone who will be around your baby should also be up-to-date on their vaccines. Adults (who are not pregnant) only need 1 dose of Tdap during adulthood.   [place] Pediatricians/Family Doctors:  [facility] [place] [phone]                 [place] [phone] (usually not accepting new patients unless you have family there already, you are always welcome to call and ask)            [facility] Pediatricians/Family Doctors:   [facility]: [phone]  [facility]: [phone]    Third Trimester of Pregnancy The third trimester is from week 29 through week 42, months 7 through 9. The third trimester is a time when the fetus is growing rapidly. At the end of the ninth month, the fetus is about 20  inches in length and weighs 6-10 pounds.  BODY CHANGES Your body goes through many changes during pregnancy. The changes vary from woman to woman.   Your weight will continue to increase. You can expect to gain 25-35 pounds (11-16 kg) by the end of the pregnancy.  You may begin to get stretch marks on your hips, abdomen, and breasts.  You may urinate more often because the fetus is moving lower into your pelvis and pressing on your bladder.  You may develop or continue to have heartburn as a result of your pregnancy.  You may develop constipation because certain hormones are causing the muscles that push waste through your intestines to slow down.  You may develop hemorrhoids or swollen, bulging veins (varicose veins).  You may have pelvic pain because of the weight gain and pregnancy hormones relaxing your joints between the bones in your pelvis. Backaches may result from overexertion of the muscles supporting your posture.  You may have changes in your hair. These can include thickening of your hair, rapid growth, and changes in texture. Some women also have hair loss during or after pregnancy, or hair that feels dry or thin. Your hair will most likely return to normal after your baby is born.  Your breasts will continue to grow and  be tender. A yellow discharge may leak from your breasts called colostrum.  Your belly button may stick out.  You may feel short of breath because of your expanding uterus.  You may notice the fetus "dropping," or moving lower in your abdomen.  You may have a bloody mucus discharge. This usually occurs a few days to a week before labor begins.  Your cervix becomes thin and soft (effaced) near your due date. WHAT TO EXPECT AT YOUR PRENATAL EXAMS  You will have prenatal exams every 2 weeks until week 36. Then, you will have weekly prenatal exams. During a routine prenatal visit:  You will be weighed to make sure you and the fetus are growing  normally.  Your blood pressure is taken.  Your abdomen will be measured to track your baby's growth.  The fetal heartbeat will be listened to.  Any test results from the previous visit will be discussed.  You may have a cervical check near your due date to see if you have effaced. At around 36 weeks, your caregiver will check your cervix. At the same time, your caregiver will also perform a test on the secretions of the vaginal tissue. This test is to determine if a type of bacteria, Group B streptococcus, is present. Your caregiver will explain this further. Your caregiver may ask you:  What your birth plan is.  How you are feeling.  If you are feeling the baby move.  If you have had any abnormal symptoms, such as leaking fluid, bleeding, severe headaches, or abdominal cramping.  If you have any questions. Other tests or screenings that may be performed during your third trimester include:  Blood tests that check for low iron levels (anemia).  Fetal testing to check the health, activity level, and growth of the fetus. Testing is done if you have certain medical conditions or if there are problems during the pregnancy. FALSE LABOR You may feel small, irregular contractions that eventually go away. These are called Braxton Hicks contractions, or false labor. Contractions may last for hours, days, or even weeks before true labor sets in. If contractions come at regular intervals, intensify, or become painful, it is best to be seen by your caregiver.  SIGNS OF LABOR   Menstrual-like cramps.  Contractions that are 5 minutes apart or less.  Contractions that start on the top of the uterus and spread down to the lower abdomen and back.  A sense of increased pelvic pressure or back pain.  A watery or bloody mucus discharge that comes from the vagina. If you have any of these signs before the 37th week of pregnancy, call your caregiver right away. You need to go to the hospital to  get checked immediately. HOME CARE INSTRUCTIONS   Avoid all smoking, herbs, alcohol, and unprescribed drugs. These chemicals affect the formation and growth of the baby.  Follow your caregiver's instructions regarding medicine use. There are medicines that are either safe or unsafe to take during pregnancy.  Exercise only as directed by your caregiver. Experiencing uterine cramps is a good sign to stop exercising.  Continue to eat regular, healthy meals.  Wear a good support bra for breast tenderness.  Do not use hot tubs, steam rooms, or saunas.  Wear your seat belt at all times when driving.  Avoid raw meat, uncooked cheese, cat litter boxes, and soil used by cats. These carry germs that can cause birth defects in the baby.  Take your prenatal vitamins.  Try taking  a stool softener (if your caregiver approves) if you develop constipation. Eat more high-fiber foods, such as fresh vegetables or fruit and whole grains. Drink plenty of fluids to keep your urine clear or pale yellow.  Take warm sitz baths to soothe any pain or discomfort caused by hemorrhoids. Use hemorrhoid cream if your caregiver approves.  If you develop varicose veins, wear support hose. Elevate your feet for 15 minutes, 3-4 times a day. Limit salt in your diet.  Avoid heavy lifting, wear low heal shoes, and practice good posture.  Rest a lot with your legs elevated if you have leg cramps or low back pain.  Visit your dentist if you have not gone during your pregnancy. Use a soft toothbrush to brush your teeth and be gentle when you floss.  A sexual relationship may be continued unless your caregiver directs you otherwise.  Do not travel far distances unless it is absolutely necessary and only with the approval of your caregiver.  Take prenatal classes to understand, practice, and ask questions about the labor and delivery.  Make a trial run to the hospital.  Pack your hospital bag.  Prepare the baby's  nursery.  Continue to go to all your prenatal visits as directed by your caregiver. SEEK MEDICAL CARE IF:  You are unsure if you are in labor or if your water has broken.  You have dizziness.  You have mild pelvic cramps, pelvic pressure, or nagging pain in your abdominal area.  You have persistent nausea, vomiting, or diarrhea.  You have a bad smelling vaginal discharge.  You have pain with urination. SEEK IMMEDIATE MEDICAL CARE IF:   You have a fever.  You are leaking fluid from your vagina.  You have spotting or bleeding from your vagina.  You have severe abdominal cramping or pain.  You have rapid weight loss or gain.  You have shortness of breath with chest pain.  You notice sudden or extreme swelling of your face, hands, ankles, feet, or legs.  You have not felt your baby move in over an hour.  You have severe headaches that do not go away with medicine.  You have vision changes. Document Released: 05/03/2001 Document Revised: 05/14/2013 Document Reviewed: 07/10/2012 [facility] Patient Information 2015 [place], [place]. This information is not intended to replace advice given to you by your health care provider. Make sure you discuss any questions you have with your health care provider.

## 2017-01-11 NOTE — Progress Notes (Signed)
High Risk Pregnancy Diagnosis(es): AMA [id number] [redacted]w[redacted]d Estimated Date of Delivery: 03/15/17 BP 110/60   Pulse 80   Wt 239 lb (108.4 kg)   LMP 06/08/2016 (Approximate)   BMI 40.71 kg/m   Urinalysis: unable to void, will try again before she leaves HPI:  Feet swelling, needs note saying ok to wear flip flops to work-note given.  BP, weight, and urine reviewed.  Reports good fm. Denies regular uc's, lof, vb, uti s/s.   Fundal Height:  33 Fetal Heart rate:  155 Edema:  1+  Reviewed ptl s/s, fkc, normal pn2 results. Recommended Tdap at HD/PCP per CDC guidelines.  All questions were answered Assessment: [redacted]w[redacted]d AMA Medication(s) Plans:  Baby ASA Treatment Plan:  U/S @ 32, 36wks    2x/wk testing nst/sono @ 36wks    Deliver @ 39wks (d/t repeat c/s) Follow up in 1wk for high-risk OB appt w/ JVF (to schedule c/s) and efw/afi u/s d/t AMA

## 2017-01-12 DIAGNOSIS — Z029 Encounter for administrative examinations, unspecified: Secondary | ICD-10-CM

## 2017-01-20 ENCOUNTER — Ambulatory Visit (INDEPENDENT_AMBULATORY_CARE_PROVIDER_SITE_OTHER): Payer: 59 | Admitting: Obstetrics and Gynecology

## 2017-01-20 ENCOUNTER — Encounter: Payer: Self-pay | Admitting: Obstetrics and Gynecology

## 2017-01-20 ENCOUNTER — Ambulatory Visit (INDEPENDENT_AMBULATORY_CARE_PROVIDER_SITE_OTHER): Payer: 59

## 2017-01-20 VITALS — BP 120/60 | HR 80 | Wt 243.4 lb

## 2017-01-20 DIAGNOSIS — O09523 Supervision of elderly multigravida, third trimester: Secondary | ICD-10-CM | POA: Diagnosis not present

## 2017-01-20 DIAGNOSIS — O0990 Supervision of high risk pregnancy, unspecified, unspecified trimester: Secondary | ICD-10-CM

## 2017-01-20 DIAGNOSIS — Z8742 Personal history of other diseases of the female genital tract: Secondary | ICD-10-CM

## 2017-01-20 DIAGNOSIS — Z331 Pregnant state, incidental: Secondary | ICD-10-CM

## 2017-01-20 DIAGNOSIS — Z87898 Personal history of other specified conditions: Secondary | ICD-10-CM

## 2017-01-20 DIAGNOSIS — Z3A32 32 weeks gestation of pregnancy: Secondary | ICD-10-CM

## 2017-01-20 DIAGNOSIS — Z1389 Encounter for screening for other disorder: Secondary | ICD-10-CM

## 2017-01-20 DIAGNOSIS — O34219 Maternal care for unspecified type scar from previous cesarean delivery: Secondary | ICD-10-CM

## 2017-01-20 LAB — POCT URINALYSIS DIPSTICK
Glucose, UA: NEGATIVE
Ketones, UA: NEGATIVE
Leukocytes, UA: NEGATIVE
NITRITE UA: NEGATIVE
RBC UA: NEGATIVE

## 2017-01-20 NOTE — Progress Notes (Signed)
[place] 54+2 wks,cephalic,ant pl gr 1,bilat adnexa's wnl,afi 14 cm,fhr 130 bpm,EFW 2339 g 72%, BPD 99%,HC 86%,AC 92%,FL 54%

## 2017-01-20 NOTE — Progress Notes (Signed)
Debbie Young is a 46 y.o. female High Risk Pregnancy HROB Diagnosis(es): AMA  [id number] [redacted]w[redacted]d Estimated Date of Delivery: 03/15/17    HPI: The patient is being seen today for ongoing management of AMA. Today she reports no complaints. She will have a repeat C-section. Patient reports good fetal movement, denies any bleeding and no rupture of membranes symptoms or regular contractions.   BP weight and urine results reviewed and noted. Last menstrual period 06/08/2016.  Fundal Height: 36 Fetal Heart rate:  130 Physical Examination: Abdomen - soft, nontender, nondistended, no masses or organomegaly                                     Pelvic - not indicated                                     Edema:  None  Urinalysis: trace protein  Fetal Surveillance Testing today:  U/S for growth to 80-90% individually 72nd percentile total growth estimated fetal weight  Lab and sonogram results have been reviewed.   Assessment:  1.  Pregnancy at [redacted]w[redacted]d,  [id number]   :  Estimated Date of Delivery: 03/15/17                        2.  AMA                     Medication(s) Plans:  Baby ASA  Treatment Plan: U/S @ 32, 36wks    2x/wk testing nst/sono @ 36wks    Deliver @ 39wks (d/t repeat c/s)  Follow up in 2 weeks for appointment for high risk OB care, [facility] Oct 18th scheduled for c-section posted for October 18 @9  a.m.   By signing my name below, I, Izna Ahmed, attest that this documentation has been prepared under the direction and in the presence of Jonnie Kind, MD. Electronically Signed: Jabier Gauss, Medical Scribe. 01/20/17. 1:11 PM.  I personally performed the services described in this documentation, which was SCRIBED in my presence. The recorded information has been reviewed and considered accurate. It has been edited as necessary during review. Jonnie Kind, MD

## 2017-01-27 ENCOUNTER — Telehealth: Payer: Self-pay | Admitting: *Deleted

## 2017-01-27 NOTE — Telephone Encounter (Signed)
Patient called with complaints of occasional cramping since last night. It has not increased in strength or frequency and doesn't seem to be getting worse. States she feels some tightening just above her belly button and some occasional "cramping", "pulling" in her lower abdomen.  She is not leaking or bleeding and baby is "very active". Advised patient to push fluids, rest and to continue to monitor. If cramping increases in intensity, frequency, she has any bleeding,leaking or change in fetal movement, to go to [facility] for evaluation. Verbalized understanding.

## 2017-01-27 NOTE — Telephone Encounter (Signed)
Left message with someone at her job to call us back.

## 2017-02-03 ENCOUNTER — Ambulatory Visit (INDEPENDENT_AMBULATORY_CARE_PROVIDER_SITE_OTHER): Payer: 59 | Admitting: Obstetrics & Gynecology

## 2017-02-03 ENCOUNTER — Encounter: Payer: Self-pay | Admitting: Obstetrics & Gynecology

## 2017-02-03 VITALS — BP 124/72 | HR 82 | Wt 242.0 lb

## 2017-02-03 DIAGNOSIS — Z331 Pregnant state, incidental: Secondary | ICD-10-CM

## 2017-02-03 DIAGNOSIS — Z1389 Encounter for screening for other disorder: Secondary | ICD-10-CM

## 2017-02-03 DIAGNOSIS — Z3A34 34 weeks gestation of pregnancy: Secondary | ICD-10-CM

## 2017-02-03 DIAGNOSIS — O09523 Supervision of elderly multigravida, third trimester: Secondary | ICD-10-CM

## 2017-02-03 DIAGNOSIS — O0990 Supervision of high risk pregnancy, unspecified, unspecified trimester: Secondary | ICD-10-CM

## 2017-02-03 DIAGNOSIS — O34219 Maternal care for unspecified type scar from previous cesarean delivery: Secondary | ICD-10-CM

## 2017-02-03 LAB — POCT URINALYSIS DIPSTICK
GLUCOSE UA: NEGATIVE
KETONES UA: NEGATIVE
Leukocytes, UA: NEGATIVE
Nitrite, UA: NEGATIVE
PROTEIN UA: NEGATIVE

## 2017-02-03 NOTE — Progress Notes (Signed)
Fetal Surveillance Testing today:  FHR 139   High Risk Pregnancy Diagnosis(es):   AMA > 44 years old  [id number] [redacted]w[redacted]d Estimated Date of Delivery: 03/15/17  Blood pressure 124/72, pulse 82, weight 242 lb (109.8 kg), last menstrual period 06/08/2016.  Urinalysis: Negative   HPI: The patient is being seen today for ongoing management of as above. Today she reports pelvic pressure   BP weight and urine results all reviewed and noted. Patient reports good fetal movement, denies any bleeding and no rupture of membranes symptoms or regular contractions.  Fundal Height:  39 Fetal Heart rate:  139 Edema:  1+  Patient is without complaints other than noted in her HPI. All questions were answered.  All lab and sonogram results have been reviewed. Comments:    Assessment:  1.  Pregnancy at [redacted]w[redacted]d,  Estimated Date of Delivery: 03/15/17 :                          2.  AMA                        3.  Previous C section, for repeat + BTL  Medication(s) Plans:  Baby ASA  Treatment Plan:  36 weeks begin twice weekly NST until her delivery date  Return in about 11 days (around 02/14/2017) for NST, HROB. for appointment for high risk OB care  No orders of the defined types were placed in this encounter.  Orders Placed This Encounter  Procedures  . POCT urinalysis dipstick

## 2017-02-14 ENCOUNTER — Encounter: Payer: Self-pay | Admitting: Advanced Practice Midwife

## 2017-02-14 ENCOUNTER — Ambulatory Visit (INDEPENDENT_AMBULATORY_CARE_PROVIDER_SITE_OTHER): Payer: 59 | Admitting: Advanced Practice Midwife

## 2017-02-14 ENCOUNTER — Other Ambulatory Visit (INDEPENDENT_AMBULATORY_CARE_PROVIDER_SITE_OTHER): Payer: 59

## 2017-02-14 VITALS — BP 120/88 | HR 81 | Wt 246.0 lb

## 2017-02-14 DIAGNOSIS — Z3A35 35 weeks gestation of pregnancy: Secondary | ICD-10-CM

## 2017-02-14 DIAGNOSIS — O09523 Supervision of elderly multigravida, third trimester: Secondary | ICD-10-CM

## 2017-02-14 DIAGNOSIS — Z1389 Encounter for screening for other disorder: Secondary | ICD-10-CM

## 2017-02-14 DIAGNOSIS — Z331 Pregnant state, incidental: Secondary | ICD-10-CM

## 2017-02-14 DIAGNOSIS — O0993 Supervision of high risk pregnancy, unspecified, third trimester: Secondary | ICD-10-CM

## 2017-02-14 DIAGNOSIS — O0990 Supervision of high risk pregnancy, unspecified, unspecified trimester: Secondary | ICD-10-CM

## 2017-02-14 LAB — POCT URINALYSIS DIPSTICK
GLUCOSE UA: NEGATIVE
KETONES UA: NEGATIVE
LEUKOCYTES UA: NEGATIVE
Nitrite, UA: NEGATIVE
PROTEIN UA: NEGATIVE

## 2017-02-14 NOTE — Progress Notes (Signed)
[place] 88+8 wks,cephalic,anterior placenta gr 2,bilat adnexa's wnl,afi 14 cm,fhr 171 bpm,EFW 82%,BPD 99%,HC 95%,AC 95 %,FL 58%

## 2017-02-14 NOTE — Progress Notes (Signed)
Fetal Surveillance Testing today:  US/NST   High Risk Pregnancy Diagnosis(es):   AMA  [id number] [redacted]w[redacted]d Estimated Date of Delivery: 03/15/17  Blood pressure 120/88, pulse 81, weight 246 lb (111.6 kg), last menstrual period 06/08/2016.  Urinalysis: Negative   HPI: The patient is being seen today for ongoing management of the above. Today she reports no complaints.  Denies HA, vision changes, RUQ pain. Some dependent edema, Looks good today   BP weight and urine results all reviewed and noted. Patient reports good fetal movement, denies any bleeding and no rupture of membranes symptoms or regular contractions.    Fetal Heart rate:  150 Edema:  1+  Patient is without complaints other than noted in her HPI. All questions were answered.  All lab and sonogram results have been reviewed. Comments:  NST reactive  Assessment:  1.  Pregnancy at [redacted]w[redacted]d,  Estimated Date of Delivery: 03/15/17 :                          2.  AMA                        3.  Borderline BP  Medication(s) Plans:  contine ASA  Treatment Plan:  Twice weekly NST; if BP >140/90, will get label of [place]  Return for Fri/Tues for NST/HROB. for appointment for high risk OB care  No orders of the defined types were placed in this encounter.  Orders Placed This Encounter  Procedures  . US OB Follow Up  . POCT urinalysis dipstick

## 2017-02-17 ENCOUNTER — Ambulatory Visit (INDEPENDENT_AMBULATORY_CARE_PROVIDER_SITE_OTHER): Payer: 59 | Admitting: Obstetrics and Gynecology

## 2017-02-17 ENCOUNTER — Encounter: Payer: Self-pay | Admitting: Obstetrics and Gynecology

## 2017-02-17 VITALS — BP 110/60 | HR 80 | Wt 245.0 lb

## 2017-02-17 DIAGNOSIS — O0990 Supervision of high risk pregnancy, unspecified, unspecified trimester: Secondary | ICD-10-CM

## 2017-02-17 DIAGNOSIS — O09523 Supervision of elderly multigravida, third trimester: Secondary | ICD-10-CM | POA: Diagnosis not present

## 2017-02-17 DIAGNOSIS — Z331 Pregnant state, incidental: Secondary | ICD-10-CM

## 2017-02-17 DIAGNOSIS — Z3A36 36 weeks gestation of pregnancy: Secondary | ICD-10-CM

## 2017-02-17 DIAGNOSIS — Z1389 Encounter for screening for other disorder: Secondary | ICD-10-CM

## 2017-02-17 LAB — POCT URINALYSIS DIPSTICK
Glucose, UA: NEGATIVE
KETONES UA: NEGATIVE
Leukocytes, UA: NEGATIVE
Nitrite, UA: NEGATIVE
PROTEIN UA: NEGATIVE
RBC UA: NEGATIVE

## 2017-02-17 NOTE — Progress Notes (Signed)
Patient ID: Debbie Young, female   DOB: [birth year], 46 y.o.   MRN: [id number]  Fetal Surveillance Testing: NST   High Risk Pregnancy Diagnosis(es):   AMA  [id number] [redacted]w[redacted]d Estimated Date of Delivery: 03/15/17  Blood pressure 110/60, pulse 80, weight 245 lb (111.1 kg), last menstrual period 06/08/2016.  Urinalysis: Negative    HPI: She notes that she is completing the NST due to being AMA. Pt reports that she is doing well overall. She denies any complaints at this time.      BP weight and urine results all reviewed and noted. Patient reports good fetal movement, denies any bleeding and no rupture of membranes symptoms or regular contractions.  Fundal Height:  40 cm Fetal Heart rate:  135 with accelerations to 160 Edema:   Patient is without complaints. All questions were answered.  Assessment:  [redacted]w[redacted]d,    AMA  Medication(s) Plans:     Treatment Plan:    Follow up in 4 days for OB appt and NST  By signing my name below, I, Soijett Blue, attest that this documentation has been prepared under the direction and in the presence of Jonnie Kind, MD. Electronically Signed: Soijett Blue, ED Scribe. 02/17/17. 12:38 PM.  I personally performed the services described in this documentation, which was SCRIBED in my presence. The recorded information has been reviewed and considered accurate. It has been edited as necessary during review. Jonnie Kind, MD

## 2017-02-20 ENCOUNTER — Encounter: Payer: Self-pay | Admitting: Obstetrics & Gynecology

## 2017-02-20 ENCOUNTER — Ambulatory Visit (INDEPENDENT_AMBULATORY_CARE_PROVIDER_SITE_OTHER): Payer: 59 | Admitting: Obstetrics & Gynecology

## 2017-02-20 ENCOUNTER — Telehealth: Payer: Self-pay | Admitting: Obstetrics & Gynecology

## 2017-02-20 VITALS — BP 126/78 | HR 90 | Wt 248.0 lb

## 2017-02-20 DIAGNOSIS — Z3483 Encounter for supervision of other normal pregnancy, third trimester: Secondary | ICD-10-CM

## 2017-02-20 DIAGNOSIS — Z3A36 36 weeks gestation of pregnancy: Secondary | ICD-10-CM

## 2017-02-20 DIAGNOSIS — Z1389 Encounter for screening for other disorder: Secondary | ICD-10-CM

## 2017-02-20 DIAGNOSIS — Z331 Pregnant state, incidental: Secondary | ICD-10-CM

## 2017-02-20 LAB — POCT URINALYSIS DIPSTICK
Glucose, UA: NEGATIVE
KETONES UA: NEGATIVE
LEUKOCYTES UA: NEGATIVE
NITRITE UA: NEGATIVE

## 2017-02-20 NOTE — Telephone Encounter (Signed)
Spoke with pt. Pt noticed more swelling yesterday. Swelling didn't go down any last pm. Shortness of breath today when moving around. Pt is at work. I advised she needs to be seen. Appt scheduled for today. [place]

## 2017-02-20 NOTE — Progress Notes (Signed)
[id number] [redacted]w[redacted]d Estimated Date of Delivery: 03/15/17  Blood pressure 126/78, pulse 90, weight 248 lb (112.5 kg), last menstrual period 06/08/2016.   BP weight and urine results all reviewed and noted.  Please refer to the obstetrical flow sheet for the fundal height and fetal heart rate documentation:  Patient reports good fetal movement, denies any bleeding and no rupture of membranes symptoms or regular contractions. Patient is without complaints. All questions were answered.  Orders Placed This Encounter  Procedures  . GC/Chlamydia Probe Amp  . Culture, beta strep (group b only)  . POCT urinalysis dipstick    Plan:  Continued routine obstetrical care, BP is good pulse ox is 97% room air,  Lungs clear on exam Encouraged to be aware of dietary salt intake  Return for keep scheduled appts.

## 2017-02-21 ENCOUNTER — Encounter: Payer: Self-pay | Admitting: Advanced Practice Midwife

## 2017-02-21 ENCOUNTER — Other Ambulatory Visit: Payer: 59 | Admitting: Women's Health

## 2017-02-21 ENCOUNTER — Ambulatory Visit (INDEPENDENT_AMBULATORY_CARE_PROVIDER_SITE_OTHER): Payer: 59 | Admitting: Advanced Practice Midwife

## 2017-02-21 VITALS — BP 120/72 | HR 85 | Wt 250.0 lb

## 2017-02-21 DIAGNOSIS — O09523 Supervision of elderly multigravida, third trimester: Secondary | ICD-10-CM

## 2017-02-21 DIAGNOSIS — Z1389 Encounter for screening for other disorder: Secondary | ICD-10-CM

## 2017-02-21 DIAGNOSIS — Z3A36 36 weeks gestation of pregnancy: Secondary | ICD-10-CM

## 2017-02-21 DIAGNOSIS — Z23 Encounter for immunization: Secondary | ICD-10-CM

## 2017-02-21 DIAGNOSIS — O0993 Supervision of high risk pregnancy, unspecified, third trimester: Secondary | ICD-10-CM

## 2017-02-21 DIAGNOSIS — Z331 Pregnant state, incidental: Secondary | ICD-10-CM

## 2017-02-21 LAB — POCT URINALYSIS DIPSTICK
Glucose, UA: NEGATIVE
Ketones, UA: NEGATIVE
LEUKOCYTES UA: NEGATIVE
NITRITE UA: NEGATIVE
PROTEIN UA: NEGATIVE

## 2017-02-21 NOTE — Progress Notes (Signed)
Fetal Surveillance Testing today:  NST   High Risk Pregnancy Diagnosis(es):   AMA  [id number] [redacted]w[redacted]d Estimated Date of Delivery: 03/15/17  Blood pressure 120/72, pulse 85, weight 250 lb (113.4 kg), last menstrual period 06/08/2016.  Urinalysis:  2+ blood (has been worked up and is neg).   HPI: The patient is being seen today for ongoing management of the above. Today she reports feeling better.Marland Kitchen  She was worked in yesterday for swelling and feeling SOB, all was well.    BP weight and urine results all reviewed and noted. Patient reports good fetal movement, denies any bleeding and no rupture of membranes symptoms or regular contractions.   Fetal Heart rate:  150 Edema:  1+  Patient is without complaints other than noted in her HPI. All questions were answered.  All lab and sonogram results have been reviewed. Comments: normal NST reactive w/o decels. rare ctx  Assessment:  1.  Pregnancy at [redacted]w[redacted]d,  Estimated Date of Delivery: 03/15/17 :  AMA                        2.                          3.    Medication(s) Plans:  ASA 81 mg, stop next week  Treatment Plan:  NST twice weekly, CS/BTL 10/18  Return for Fridays: and Tues NST/HROB. for appointment for high risk OB care  No orders of the defined types were placed in this encounter.  Orders Placed This Encounter  Procedures  . POCT urinalysis dipstick

## 2017-02-24 ENCOUNTER — Other Ambulatory Visit: Payer: 59 | Admitting: Obstetrics & Gynecology

## 2017-02-24 ENCOUNTER — Ambulatory Visit (INDEPENDENT_AMBULATORY_CARE_PROVIDER_SITE_OTHER): Payer: 59 | Admitting: Obstetrics & Gynecology

## 2017-02-24 ENCOUNTER — Encounter: Payer: Self-pay | Admitting: Obstetrics & Gynecology

## 2017-02-24 VITALS — BP 134/86 | HR 89 | Wt 249.0 lb

## 2017-02-24 DIAGNOSIS — Z1389 Encounter for screening for other disorder: Secondary | ICD-10-CM

## 2017-02-24 DIAGNOSIS — O09523 Supervision of elderly multigravida, third trimester: Secondary | ICD-10-CM

## 2017-02-24 DIAGNOSIS — Z331 Pregnant state, incidental: Secondary | ICD-10-CM

## 2017-02-24 DIAGNOSIS — Z3A37 37 weeks gestation of pregnancy: Secondary | ICD-10-CM

## 2017-02-24 DIAGNOSIS — O0993 Supervision of high risk pregnancy, unspecified, third trimester: Secondary | ICD-10-CM

## 2017-02-24 LAB — POCT URINALYSIS DIPSTICK
GLUCOSE UA: NEGATIVE
KETONES UA: NEGATIVE
Leukocytes, UA: NEGATIVE
Nitrite, UA: NEGATIVE
Protein, UA: NEGATIVE

## 2017-02-24 LAB — GC/CHLAMYDIA PROBE AMP
CHLAMYDIA, DNA PROBE: NEGATIVE
Neisseria gonorrhoeae by PCR: NEGATIVE

## 2017-02-24 LAB — CULTURE, BETA STREP (GROUP B ONLY): STREP GP B CULTURE: NEGATIVE

## 2017-02-24 NOTE — Progress Notes (Signed)
Fetal Surveillance Testing today:  Reactive NST   High Risk Pregnancy Diagnosis(es):   AMA  [id number] [redacted]w[redacted]d Estimated Date of Delivery: 03/15/17  Blood pressure 134/86, pulse 89, weight 249 lb (112.9 kg), last menstrual period 06/08/2016.  Urinalysis: Negative   HPI: The patient is being seen today for ongoing management of AMA. Today she reports feeling tired, back pain, pelvic pain   BP weight and urine results all reviewed and noted. Patient reports good fetal movement, denies any bleeding and no rupture of membranes symptoms or regular contractions.  Fundal Height:  42 Fetal Heart rate:  140 Edema:  1+  Patient is without complaints other than noted in her HPI. All questions were answered.  All lab and sonogram results have been reviewed. Comments:    Assessment:  1.  Pregnancy at [redacted]w[redacted]d,  Estimated Date of Delivery: 03/15/17 :                          2.  AMA                        3.    Medication(s) Plans:  Baby ASA  Treatment Plan:  Will do a BPP next week to check on fluid volume, then NST the next week  Return in about 4 days (around 02/28/2017) for BPP/sono, HROB, with Dr Elonda Husky. for appointment for high risk OB care  No orders of the defined types were placed in this encounter.  Orders Placed This Encounter  Procedures  . US Fetal BPP W/O Non Stress  . POCT urinalysis dipstick

## 2017-02-28 ENCOUNTER — Telehealth (HOSPITAL_COMMUNITY): Payer: Self-pay | Admitting: *Deleted

## 2017-02-28 ENCOUNTER — Ambulatory Visit (INDEPENDENT_AMBULATORY_CARE_PROVIDER_SITE_OTHER): Payer: 59

## 2017-02-28 ENCOUNTER — Ambulatory Visit (INDEPENDENT_AMBULATORY_CARE_PROVIDER_SITE_OTHER): Payer: 59 | Admitting: Obstetrics & Gynecology

## 2017-02-28 ENCOUNTER — Other Ambulatory Visit: Payer: 59 | Admitting: Obstetrics & Gynecology

## 2017-02-28 VITALS — BP 120/70 | HR 75 | Wt 247.0 lb

## 2017-02-28 DIAGNOSIS — O09523 Supervision of elderly multigravida, third trimester: Secondary | ICD-10-CM

## 2017-02-28 DIAGNOSIS — O0993 Supervision of high risk pregnancy, unspecified, third trimester: Secondary | ICD-10-CM

## 2017-02-28 DIAGNOSIS — Z331 Pregnant state, incidental: Secondary | ICD-10-CM

## 2017-02-28 DIAGNOSIS — O34219 Maternal care for unspecified type scar from previous cesarean delivery: Secondary | ICD-10-CM

## 2017-02-28 DIAGNOSIS — Z1389 Encounter for screening for other disorder: Secondary | ICD-10-CM

## 2017-02-28 DIAGNOSIS — Z3A37 37 weeks gestation of pregnancy: Secondary | ICD-10-CM

## 2017-02-28 DIAGNOSIS — O0990 Supervision of high risk pregnancy, unspecified, unspecified trimester: Secondary | ICD-10-CM

## 2017-02-28 LAB — POCT URINALYSIS DIPSTICK
Blood, UA: NEGATIVE
Glucose, UA: NEGATIVE
KETONES UA: NEGATIVE
LEUKOCYTES UA: NEGATIVE
NITRITE UA: NEGATIVE
PROTEIN UA: NEGATIVE

## 2017-02-28 NOTE — Telephone Encounter (Signed)
Preadmission screen  

## 2017-02-28 NOTE — Progress Notes (Signed)
Fetal Surveillance Testing today:  BPP 8/8   High Risk Pregnancy Diagnosis(es):   AMA > 40 years  [id number] [redacted]w[redacted]d Estimated Date of Delivery: 03/15/17  Blood pressure 120/70, pulse 75, weight 247 lb (112 kg), last menstrual period 06/08/2016.  Urinalysis: Negative   HPI: The patient is being seen today for ongoing management of as above. Today she reports back ache pressure   BP weight and urine results all reviewed and noted. Patient reports good fetal movement, denies any bleeding and no rupture of membranes symptoms or regular contractions.  Fundal Height:  40  Fetal Heart rate:  145 Edema:  none  Patient is without complaints other than noted in her HPI. All questions were answered.  All lab and sonogram results have been reviewed. Comments:    Assessment:  1.  Pregnancy at [redacted]w[redacted]d,  Estimated Date of Delivery: 03/15/17 :                          2.  AMA                        3.  Previous C section  Medication(s) Plans:  none  Treatment Plan:  Twice weekly testing NST/BPP, delivery at 39 weeks  Return in about 3 days (around 03/03/2017) for NST, HROB. for appointment for high risk OB care  No orders of the defined types were placed in this encounter.  Orders Placed This Encounter  Procedures  . POCT Urinalysis Dipstick

## 2017-02-28 NOTE — Progress Notes (Signed)
[place] 58+7 wks,cephalic,fhr 276 bpm,bilat adnexa's wnl,BPP 8/8,AFI 23 cm,ant pl gr 1

## 2017-03-01 ENCOUNTER — Encounter (HOSPITAL_COMMUNITY): Payer: Self-pay

## 2017-03-03 ENCOUNTER — Ambulatory Visit (INDEPENDENT_AMBULATORY_CARE_PROVIDER_SITE_OTHER): Payer: 59 | Admitting: Obstetrics & Gynecology

## 2017-03-03 ENCOUNTER — Encounter: Payer: Self-pay | Admitting: Obstetrics & Gynecology

## 2017-03-03 VITALS — BP 118/76 | HR 78 | Wt 248.0 lb

## 2017-03-03 DIAGNOSIS — Z1389 Encounter for screening for other disorder: Secondary | ICD-10-CM

## 2017-03-03 DIAGNOSIS — Z3A38 38 weeks gestation of pregnancy: Secondary | ICD-10-CM | POA: Diagnosis not present

## 2017-03-03 DIAGNOSIS — Z331 Pregnant state, incidental: Secondary | ICD-10-CM

## 2017-03-03 DIAGNOSIS — O34219 Maternal care for unspecified type scar from previous cesarean delivery: Secondary | ICD-10-CM

## 2017-03-03 DIAGNOSIS — O0990 Supervision of high risk pregnancy, unspecified, unspecified trimester: Secondary | ICD-10-CM

## 2017-03-03 DIAGNOSIS — O09523 Supervision of elderly multigravida, third trimester: Secondary | ICD-10-CM

## 2017-03-03 LAB — POCT URINALYSIS DIPSTICK
Blood, UA: NEGATIVE
Glucose, UA: NEGATIVE
KETONES UA: NEGATIVE
LEUKOCYTES UA: NEGATIVE
Nitrite, UA: NEGATIVE
PROTEIN UA: NEGATIVE

## 2017-03-03 NOTE — Progress Notes (Signed)
Fetal Surveillance Testing today:  Reactive NST   High Risk Pregnancy Diagnosis(es):   AMA, previous C section  [id number] [redacted]w[redacted]d Estimated Date of Delivery: 03/15/17  Blood pressure 118/76, pulse 78, weight 248 lb (112.5 kg), last menstrual period 06/08/2016.  Urinalysis: Negative   HPI: The patient is being seen today for ongoing management of as above. Today she reports back ache pelvic pain   BP weight and urine results all reviewed and noted. Patient reports good fetal movement, denies any bleeding and no rupture of membranes symptoms or regular contractions.  Fundal Height:  42   Fetal Heart rate:  145 Edema:  1+  Patient is without complaints other than noted in her HPI. All questions were answered.  All lab and sonogram results have been reviewed. Comments:    Assessment:  1.  Pregnancy at [redacted]w[redacted]d,  Estimated Date of Delivery: 03/15/17 :                          2.  AMA                        3.  Previous C section  Medication(s) Plans: baby ASA  Treatment Plan:  NST next week scheduled c section 03/09/2017  Return in about 3 days (around 03/06/2017) for NST, HROB with Dr Glo Herring. for appointment for high risk OB care  No orders of the defined types were placed in this encounter.  Orders Placed This Encounter  Procedures  . POCT urinalysis dipstick

## 2017-03-06 ENCOUNTER — Ambulatory Visit (INDEPENDENT_AMBULATORY_CARE_PROVIDER_SITE_OTHER): Payer: 59 | Admitting: Obstetrics and Gynecology

## 2017-03-06 ENCOUNTER — Encounter: Payer: Self-pay | Admitting: Obstetrics and Gynecology

## 2017-03-06 ENCOUNTER — Other Ambulatory Visit: Payer: Self-pay | Admitting: Obstetrics and Gynecology

## 2017-03-06 DIAGNOSIS — Z1389 Encounter for screening for other disorder: Secondary | ICD-10-CM

## 2017-03-06 DIAGNOSIS — O09523 Supervision of elderly multigravida, third trimester: Secondary | ICD-10-CM | POA: Diagnosis not present

## 2017-03-06 DIAGNOSIS — Z331 Pregnant state, incidental: Secondary | ICD-10-CM

## 2017-03-06 DIAGNOSIS — Z3A38 38 weeks gestation of pregnancy: Secondary | ICD-10-CM

## 2017-03-06 DIAGNOSIS — O34219 Maternal care for unspecified type scar from previous cesarean delivery: Secondary | ICD-10-CM

## 2017-03-06 LAB — POCT URINALYSIS DIPSTICK
GLUCOSE UA: NEGATIVE
Ketones, UA: NEGATIVE
LEUKOCYTES UA: NEGATIVE
Nitrite, UA: NEGATIVE
Protein, UA: NEGATIVE

## 2017-03-06 NOTE — Progress Notes (Signed)
46

## 2017-03-06 NOTE — Progress Notes (Signed)
Debbie Young is a 46 y.o. female High Risk Pregnancy HROB Diagnosis(es):   AMA, previous C section Desire for permanent sterilization [phone] [redacted]w[redacted]d Estimated Date of Delivery: 03/15/17    HPI: The patient is being seen today for ongoing management of AMA, previous C section declining trial of labor Review of systems positive that she has had a prior LEEP of the cervix . Today she reports no complaints. On Saturday, she had radiating back pain across her lower back. She also has pain around her pelvic area when she gets up and walks.  Pt had a previous C-section due to breech baby. She has had a colpo and a LEEP after her first baby. Pt has family hx of hypotension after surgeries. She will have a tubal ligation with her C-Section  Patient reports good fetal movement, denies any bleeding and no rupture of membranes symptoms or regular contractions.   BP weight and urine results reviewed and noted. Last menstrual period 06/08/2016.  Fundal Height:  44 Fetal Heart rate: 140 with accelerations to 165 Physical Examination: Abdomen - soft, nontender, nondistended, no masses or organomegaly                                     Pelvic - cervix is fingertip, 50% thinned out, 1 cm                                     Edema:  1+  Urinalysis: moderate blood  Fetal Surveillance Testing today:  NST - reactive  Lab and sonogram results have been reviewed.  Assessment:  1.  Pregnancy at [redacted]w[redacted]d,  [id number]   :  Estimated Date of Delivery: 03/15/17                        2.  AMA                        3. Previous C-section with breech presentation  Medication(s) Plans:  Baby aspirin  Treatment Plan:  Repeat C-section with BTL scheduled for 03/09/2017  Follow up in 10 days for appointment for PP care/incision check   By signing my name below, I, Izna Ahmed, attest that this documentation has been prepared under the direction and in the presence of Jonnie Kind, MD. Electronically Signed:  Jabier Gauss, Medical Scribe. 03/06/17. 11:00 AM.  I personally performed the services described in this documentation, which was SCRIBED in my presence. The recorded information has been reviewed and considered accurate. It has been edited as necessary during review. Jonnie Kind, MD

## 2017-03-06 NOTE — H&P (Signed)
Debbie Young is a 46 y.o. female [phone]  at [redacted]w[redacted]d presenting for Repeat cesarean section and bilateral tubal lligation at 39.0wk. Prenatal course has been benign, and pt has again confirmed on final preop visit that she desires a repeat cesarean and tubal ligation. She specifically again declines trial of labor. And confirms her desire for permanent sterilization. OB History    Gravida Para Term Preterm AB Living   4 1 0 1 2 1    SAB TAB Ectopic Multiple Live Births   2       1     Past Medical History:  Diagnosis Date  . Anxiety   . Cellulitis 04/2001   LEFT FOOT  . Hematuria    BENIGN--SAW UROLOGIST  . History of abnormal cervical Pap smear 04/30/2015  . LGSIL (low grade squamous intraepithelial dysplasia) 1997  . Miscarriage 08/02/2013  . Patient desires pregnancy 11/18/2013  . Seizure disorder ([place])    AS A CHILD--NO MEDS SINCE 24 YO  . Smoker   . Vaginal Pap smear, abnormal    Past Surgical History:  Procedure Laterality Date  . [place]  . CERVICAL BIOPSY  W/ LOOP ELECTRODE EXCISION  07/2002  . CESAREAN SECTION    . CHOLECYSTECTOMY    . COLPOSCOPY    . REFRACTIVE SURGERY  2000  . TONSILLECTOMY AND ADENOIDECTOMY     Family History: family history includes Anuerysm in her maternal grandmother; Asthma in her son; Breast cancer in her maternal aunt and maternal grandmother; Breast cancer (age of onset: 44) in her mother; Cancer in her father; Fibromyalgia in her mother; Heart disease in her maternal grandmother, paternal grandfather, and paternal grandmother; Heart failure in her maternal grandmother; Hypertension in her maternal grandfather; Stroke in her maternal grandfather; Von Willebrand disease in her mother. Social History:  reports that she has been smoking Cigarettes.  She has a 5.00 pack-year smoking history. She has never used smokeless tobacco. She reports that she does not drink alcohol or use drugs.     Maternal Diabetes: No Genetic Screening:  Normal Maternal Ultrasounds/Referrals: Normal Fetal Ultrasounds or other Referrals:  None Maternal Substance Abuse:  No Significant Maternal Medications:  None Significant Maternal Lab Results:  None Other Comments:  None  ROS History   Last menstrual period 06/08/2016. Maternal Exam:  Uterine Assessment: Contraction frequency is rare.   Abdomen: Patient reports no abdominal tenderness. Surgical scars: low transverse.   Fundal height is 44.   Estimated fetal weight is 8+3.   Fetal presentation: vertex  Introitus: Normal vulva. Normal vagina.  Ferning test: not done.  Nitrazine test: not done. Amniotic fluid character: not assessed.  Cervix: Cervix evaluated by digital exam.     Physical Exam  Constitutional: She is oriented to person, place, and time. She appears well-developed and well-nourished.  HENT:  Head: Normocephalic.  Eyes: Pupils are equal, round, and reactive to light.  Neck: Normal range of motion.  Genitourinary: Vagina normal.  Musculoskeletal: Normal range of motion.  Neurological: She is alert and oriented to person, place, and time.  Skin: Skin is warm and dry.  Psychiatric: She has a normal mood and affect. Her behavior is normal. Judgment and thought content normal.    Prenatal labs: ABO, Rh: A/Positive/-- (04/04 1204) Antibody: Negative (07/25 0918) Rubella: 2.95 (04/04 1204) RPR: Non Reactive (07/25 0918)  HBsAg: Negative (04/04 1204)  HIV:    GBS:     Assessment/Plan: Pregnancy 39 weeks Previous cesarean section declining trial of  labor Desire for elective permanent sterilization Plan: Repeat cesarean section and bilateral tubal ligation for 9 AM on 18 October , 11:49 AM

## 2017-03-07 ENCOUNTER — Other Ambulatory Visit: Payer: 59 | Admitting: Women's Health

## 2017-03-07 NOTE — Patient Instructions (Signed)
Tokiko P Sanjurjo  03/07/2017   Your procedure is scheduled on:  03/09/2017  Enter through the Main Entrance of [facility] up the phone at the desk and dial [phone]  Call this number if you have problems the morning of surgery:[phone]  Remember:   Do not eat food:After Midnight.  Do not drink clear liquids: After Midnight.  Take these medicines the morning of surgery with A SIP OF WATER: none.  Please bring your inhaler   Do not wear jewelry, make-up or nail polish.  Do not wear lotions, powders, or perfumes. Do not wear deodorant.  Do not shave 48 hours prior to surgery.  Do not bring valuables to the hospital.  [facility] is not   responsible for any belongings or valuables brought to the hospital.  Contacts, dentures or bridgework may not be worn into surgery.  Leave suitcase in the car. After surgery it may be brought to your room.  For patients admitted to the hospital, checkout time is 11:00 AM the day of              discharge.    N/A   Please read over the following fact sheets that you were given:   Surgical Site Infection Prevention

## 2017-03-08 ENCOUNTER — Encounter (HOSPITAL_COMMUNITY)
Admission: RE | Admit: 2017-03-08 | Discharge: 2017-03-08 | Disposition: A | Discharge status: Home / Self Care | Payer: 59 | Source: Ambulatory Visit | Attending: Obstetrics and Gynecology | Admitting: Obstetrics and Gynecology

## 2017-03-08 LAB — CBC
HCT: 38.3 % (ref 36.0–46.0)
Hemoglobin: 12.5 g/dL (ref 12.0–15.0)
MCH: 30.3 pg (ref 26.0–34.0)
MCHC: 32.6 g/dL (ref 30.0–36.0)
MCV: 92.7 fL (ref 78.0–100.0)
Platelets: 215 K/uL (ref 150–400)
RBC: 4.13 MIL/uL (ref 3.87–5.11)
RDW: 13.8 % (ref 11.5–15.5)
WBC: 8.1 K/uL (ref 4.0–10.5)

## 2017-03-08 LAB — URINALYSIS, ROUTINE W REFLEX MICROSCOPIC
Bilirubin Urine: NEGATIVE
Glucose, UA: NEGATIVE mg/dL
Ketones, ur: NEGATIVE mg/dL
Nitrite: NEGATIVE
Protein, ur: NEGATIVE mg/dL
Specific Gravity, Urine: 1.01 (ref 1.005–1.030)
pH: 6 (ref 5.0–8.0)

## 2017-03-08 LAB — ABO/RH: ABO/RH(D): A POS

## 2017-03-08 LAB — TYPE AND SCREEN
ABO/RH(D): A POS
ANTIBODY SCREEN: NEGATIVE

## 2017-03-09 ENCOUNTER — Encounter (HOSPITAL_COMMUNITY): Payer: Self-pay | Admitting: Certified Registered Nurse Anesthetist

## 2017-03-09 ENCOUNTER — Inpatient Hospital Stay (HOSPITAL_COMMUNITY): Payer: 59 | Admitting: Certified Registered Nurse Anesthetist

## 2017-03-09 ENCOUNTER — Inpatient Hospital Stay (HOSPITAL_COMMUNITY)
Admission: RE | Admit: 2017-03-09 | Discharge: 2017-03-11 | DRG: 785 | Disposition: A | Discharge status: Home / Self Care | Payer: 59 | Source: Ambulatory Visit | Attending: Obstetrics and Gynecology | Admitting: Obstetrics and Gynecology

## 2017-03-09 ENCOUNTER — Encounter (HOSPITAL_COMMUNITY)
Admission: RE | Disposition: A | Discharge status: Home / Self Care | Payer: Self-pay | Source: Ambulatory Visit | Attending: Obstetrics and Gynecology

## 2017-03-09 DIAGNOSIS — Z98891 History of uterine scar from previous surgery: Secondary | ICD-10-CM

## 2017-03-09 DIAGNOSIS — Z3A39 39 weeks gestation of pregnancy: Secondary | ICD-10-CM

## 2017-03-09 DIAGNOSIS — Z302 Encounter for sterilization: Secondary | ICD-10-CM

## 2017-03-09 DIAGNOSIS — Z9851 Tubal ligation status: Secondary | ICD-10-CM

## 2017-03-09 DIAGNOSIS — F17210 Nicotine dependence, cigarettes, uncomplicated: Secondary | ICD-10-CM | POA: Diagnosis present

## 2017-03-09 DIAGNOSIS — O99334 Smoking (tobacco) complicating childbirth: Secondary | ICD-10-CM | POA: Diagnosis present

## 2017-03-09 DIAGNOSIS — O34211 Maternal care for low transverse scar from previous cesarean delivery: Principal | ICD-10-CM | POA: Diagnosis present

## 2017-03-09 DIAGNOSIS — O0990 Supervision of high risk pregnancy, unspecified, unspecified trimester: Secondary | ICD-10-CM

## 2017-03-09 LAB — RPR: RPR Ser Ql: NONREACTIVE

## 2017-03-09 SURGERY — Surgical Case
Anesthesia: Spinal | Site: Abdomen | Laterality: Bilateral | Wound class: Clean Contaminated

## 2017-03-09 MED ORDER — ONDANSETRON HCL 4 MG/2ML IJ SOLN
INTRAMUSCULAR | Status: AC
  Filled 2017-03-09: qty 2

## 2017-03-09 MED ORDER — DEXAMETHASONE SODIUM PHOSPHATE 4 MG/ML IJ SOLN
INTRAMUSCULAR | Status: DC | PRN
  Administered 2017-03-09: 4 mg via INTRAVENOUS

## 2017-03-09 MED ORDER — OXYCODONE HCL 5 MG/5ML PO SOLN
5.0000 mg | Freq: Once | ORAL | Status: DC | PRN
  Filled 2017-03-09: qty 5

## 2017-03-09 MED ORDER — OXYCODONE HCL 5 MG PO TABS: 5.0000 mg | ORAL_TABLET | Freq: Once | ORAL | Status: DC | PRN

## 2017-03-09 MED ORDER — ZOLPIDEM TARTRATE 5 MG PO TABS: 5.0000 mg | ORAL_TABLET | Freq: Every evening | ORAL | Status: DC | PRN

## 2017-03-09 MED ORDER — LACTATED RINGERS IV SOLN
INTRAVENOUS | Status: DC | PRN
  Administered 2017-03-09: 09:00:00 via INTRAVENOUS

## 2017-03-09 MED ORDER — STERILE WATER FOR IRRIGATION IR SOLN
Status: DC | PRN
  Administered 2017-03-09: 1000 mL

## 2017-03-09 MED ORDER — SCOPOLAMINE 1 MG/3DAYS TD PT72
1.0000 | MEDICATED_PATCH | Freq: Once | TRANSDERMAL | Status: DC
  Filled 2017-03-09: qty 1

## 2017-03-09 MED ORDER — LACTATED RINGERS IV SOLN
INTRAVENOUS | Status: DC | PRN
  Administered 2017-03-09 (×2): via INTRAVENOUS

## 2017-03-09 MED ORDER — MORPHINE SULFATE (PF) 0.5 MG/ML IJ SOLN
INTRAMUSCULAR | Status: DC | PRN
  Administered 2017-03-09: .2 mg via INTRATHECAL

## 2017-03-09 MED ORDER — FENTANYL CITRATE (PF) 100 MCG/2ML IJ SOLN
INTRAMUSCULAR | Status: DC | PRN
  Administered 2017-03-09: 10 ug via INTRATHECAL

## 2017-03-09 MED ORDER — KETOROLAC TROMETHAMINE 30 MG/ML IJ SOLN
30.0000 mg | Freq: Four times a day (QID) | INTRAMUSCULAR | Status: AC | PRN
  Administered 2017-03-09: 30 mg via INTRAMUSCULAR
  Filled 2017-03-09: qty 1

## 2017-03-09 MED ORDER — DIPHENHYDRAMINE HCL 25 MG PO CAPS: 25.0000 mg | ORAL_CAPSULE | Freq: Four times a day (QID) | ORAL | Status: DC | PRN

## 2017-03-09 MED ORDER — OXYCODONE HCL 5 MG PO TABS
10.0000 mg | ORAL_TABLET | ORAL | Status: DC | PRN
Start: 2017-03-09 — End: 2017-03-11
  Filled 2017-03-09: qty 2

## 2017-03-09 MED ORDER — SIMETHICONE 80 MG PO CHEW: 80.0000 mg | CHEWABLE_TABLET | ORAL | Status: DC | PRN

## 2017-03-09 MED ORDER — MENTHOL 3 MG MT LOZG: 1.0000 | LOZENGE | OROMUCOSAL | Status: DC | PRN

## 2017-03-09 MED ORDER — SCOPOLAMINE 1 MG/3DAYS TD PT72
MEDICATED_PATCH | TRANSDERMAL | Status: DC | PRN
  Administered 2017-03-09: 1 via TRANSDERMAL

## 2017-03-09 MED ORDER — ONDANSETRON HCL 4 MG/2ML IJ SOLN: 4.0000 mg | Freq: Three times a day (TID) | INTRAMUSCULAR | Status: DC | PRN

## 2017-03-09 MED ORDER — NALOXONE HCL 0.4 MG/ML IJ SOLN: 0.4000 mg | INTRAMUSCULAR | Status: DC | PRN

## 2017-03-09 MED ORDER — CEFAZOLIN SODIUM-DEXTROSE 2-4 GM/100ML-% IV SOLN: 2.0000 g | INTRAVENOUS | Status: DC

## 2017-03-09 MED ORDER — ONDANSETRON HCL 4 MG/2ML IJ SOLN
INTRAMUSCULAR | Status: DC | PRN
  Administered 2017-03-09: 4 mg via INTRAVENOUS

## 2017-03-09 MED ORDER — DIPHENHYDRAMINE HCL 25 MG PO CAPS: 25.0000 mg | ORAL_CAPSULE | ORAL | Status: DC | PRN

## 2017-03-09 MED ORDER — PHENYLEPHRINE 8 MG IN D5W 100 ML (0.08MG/ML) PREMIX OPTIME
INJECTION | INTRAVENOUS | Status: AC
  Filled 2017-03-09: qty 100

## 2017-03-09 MED ORDER — SODIUM CHLORIDE 0.9% FLUSH: 3.0000 mL | INTRAVENOUS | Status: DC | PRN

## 2017-03-09 MED ORDER — NALBUPHINE HCL 10 MG/ML IJ SOLN: 5.0000 mg | INTRAMUSCULAR | Status: DC | PRN

## 2017-03-09 MED ORDER — IBUPROFEN 600 MG PO TABS
600.0000 mg | ORAL_TABLET | Freq: Four times a day (QID) | ORAL | Status: DC
  Administered 2017-03-09 – 2017-03-11 (×8): 600 mg via ORAL
  Filled 2017-03-09 (×8): qty 1

## 2017-03-09 MED ORDER — NALBUPHINE HCL 10 MG/ML IJ SOLN: 5.0000 mg | Freq: Once | INTRAMUSCULAR | Status: DC | PRN

## 2017-03-09 MED ORDER — CEFAZOLIN SODIUM-DEXTROSE 2-4 GM/100ML-% IV SOLN
2.0000 g | INTRAVENOUS | Status: AC
  Administered 2017-03-09: 2 g via INTRAVENOUS

## 2017-03-09 MED ORDER — MORPHINE SULFATE (PF) 0.5 MG/ML IJ SOLN: INTRAMUSCULAR | Status: DC | PRN

## 2017-03-09 MED ORDER — SENNOSIDES-DOCUSATE SODIUM 8.6-50 MG PO TABS
2.0000 | ORAL_TABLET | ORAL | Status: DC
  Administered 2017-03-09 – 2017-03-10 (×2): 2 via ORAL
  Filled 2017-03-09 (×2): qty 2

## 2017-03-09 MED ORDER — COCONUT OIL OIL: 1.0000 "application " | TOPICAL_OIL | Status: DC | PRN

## 2017-03-09 MED ORDER — DEXAMETHASONE SODIUM PHOSPHATE 4 MG/ML IJ SOLN
INTRAMUSCULAR | Status: AC
  Filled 2017-03-09: qty 1

## 2017-03-09 MED ORDER — OXYCODONE HCL 5 MG PO TABS
5.0000 mg | ORAL_TABLET | ORAL | Status: DC | PRN
  Administered 2017-03-10 (×2): 5 mg via ORAL
  Filled 2017-03-09 (×2): qty 1

## 2017-03-09 MED ORDER — MEPERIDINE HCL 25 MG/ML IJ SOLN: 6.2500 mg | INTRAMUSCULAR | Status: DC | PRN

## 2017-03-09 MED ORDER — OXYTOCIN 40 UNITS IN LACTATED RINGERS INFUSION - SIMPLE MED: 2.5000 [IU]/h | INTRAVENOUS | Status: AC

## 2017-03-09 MED ORDER — FENTANYL CITRATE (PF) 100 MCG/2ML IJ SOLN: 25.0000 ug | INTRAMUSCULAR | Status: DC | PRN

## 2017-03-09 MED ORDER — KETOROLAC TROMETHAMINE 30 MG/ML IJ SOLN
INTRAMUSCULAR | Status: AC
  Filled 2017-03-09: qty 1

## 2017-03-09 MED ORDER — DIPHENHYDRAMINE HCL 50 MG/ML IJ SOLN: 12.5000 mg | INTRAMUSCULAR | Status: DC | PRN

## 2017-03-09 MED ORDER — MORPHINE SULFATE (PF) 0.5 MG/ML IJ SOLN
INTRAMUSCULAR | Status: AC
  Filled 2017-03-09: qty 10

## 2017-03-09 MED ORDER — SIMETHICONE 80 MG PO CHEW
80.0000 mg | CHEWABLE_TABLET | ORAL | Status: DC
  Filled 2017-03-09 (×2): qty 1

## 2017-03-09 MED ORDER — ONDANSETRON HCL 4 MG/2ML IJ SOLN: 4.0000 mg | Freq: Four times a day (QID) | INTRAMUSCULAR | Status: DC | PRN

## 2017-03-09 MED ORDER — WITCH HAZEL-GLYCERIN EX PADS: 1.0000 "application " | MEDICATED_PAD | CUTANEOUS | Status: DC | PRN

## 2017-03-09 MED ORDER — KETOROLAC TROMETHAMINE 30 MG/ML IJ SOLN: 30.0000 mg | Freq: Four times a day (QID) | INTRAMUSCULAR | Status: AC | PRN

## 2017-03-09 MED ORDER — TETANUS-DIPHTH-ACELL PERTUSSIS 5-2.5-18.5 LF-MCG/0.5 IM SUSP: 0.5000 mL | Freq: Once | INTRAMUSCULAR | Status: DC

## 2017-03-09 MED ORDER — OXYTOCIN 10 UNIT/ML IJ SOLN
INTRAMUSCULAR | Status: AC
  Filled 2017-03-09: qty 4

## 2017-03-09 MED ORDER — BUPIVACAINE IN DEXTROSE 0.75-8.25 % IT SOLN
INTRATHECAL | Status: DC | PRN
  Administered 2017-03-09: 1.8 mL via INTRATHECAL

## 2017-03-09 MED ORDER — DIBUCAINE 1 % RE OINT: 1.0000 "application " | TOPICAL_OINTMENT | RECTAL | Status: DC | PRN

## 2017-03-09 MED ORDER — KETOROLAC TROMETHAMINE 30 MG/ML IJ SOLN: 30.0000 mg | Freq: Four times a day (QID) | INTRAMUSCULAR | Status: AC

## 2017-03-09 MED ORDER — OXYTOCIN 10 UNIT/ML IJ SOLN
INTRAVENOUS | Status: DC | PRN
  Administered 2017-03-09: 40 [IU] via INTRAVENOUS

## 2017-03-09 MED ORDER — SODIUM CHLORIDE 0.9 % IR SOLN
Status: DC | PRN
  Administered 2017-03-09: 1000 mL

## 2017-03-09 MED ORDER — SIMETHICONE 80 MG PO CHEW
80.0000 mg | CHEWABLE_TABLET | Freq: Three times a day (TID) | ORAL | Status: DC
  Administered 2017-03-09 – 2017-03-11 (×7): 80 mg via ORAL
  Filled 2017-03-09 (×6): qty 1

## 2017-03-09 MED ORDER — FENTANYL CITRATE (PF) 100 MCG/2ML IJ SOLN
INTRAMUSCULAR | Status: AC
  Filled 2017-03-09: qty 2

## 2017-03-09 MED ORDER — LACTATED RINGERS IV SOLN
INTRAVENOUS | Status: DC
  Administered 2017-03-09: 19:00:00 via INTRAVENOUS

## 2017-03-09 MED ORDER — PRENATAL MULTIVITAMIN CH
1.0000 | ORAL_TABLET | Freq: Every day | ORAL | Status: DC
  Administered 2017-03-10 – 2017-03-11 (×2): 1 via ORAL
  Filled 2017-03-09 (×2): qty 1

## 2017-03-09 MED ORDER — NALOXONE HCL 2 MG/2ML IJ SOSY
1.0000 ug/kg/h | PREFILLED_SYRINGE | INTRAMUSCULAR | Status: DC | PRN
  Filled 2017-03-09: qty 2

## 2017-03-09 MED ORDER — BUPIVACAINE IN DEXTROSE 0.75-8.25 % IT SOLN
INTRATHECAL | Status: AC
  Filled 2017-03-09: qty 2

## 2017-03-09 MED ORDER — ACETAMINOPHEN 325 MG PO TABS
650.0000 mg | ORAL_TABLET | ORAL | Status: DC | PRN
  Administered 2017-03-10 – 2017-03-11 (×3): 650 mg via ORAL
  Filled 2017-03-09 (×4): qty 2

## 2017-03-09 MED ORDER — PHENYLEPHRINE 8 MG IN D5W 100 ML (0.08MG/ML) PREMIX OPTIME
INJECTION | INTRAVENOUS | Status: DC | PRN
  Administered 2017-03-09: 60 ug/min via INTRAVENOUS

## 2017-03-09 SURGICAL SUPPLY — 37 items
APL SKNCLS STERI-STRIP NONHPOA (GAUZE/BANDAGES/DRESSINGS) ×1
BENZOIN TINCTURE PRP APPL 2/3 (GAUZE/BANDAGES/DRESSINGS) ×2 IMPLANT
CLAMP CORD UMBIL (MISCELLANEOUS) IMPLANT
CLIP FILSHIE TUBAL LIGA STRL (Clip) ×2 IMPLANT
CLOTH BEACON ORANGE TIMEOUT ST (SAFETY) ×2 IMPLANT
DRSG OPSITE POSTOP 4X10 (GAUZE/BANDAGES/DRESSINGS) ×2 IMPLANT
DRSG OPSITE POSTOP 4X12 (GAUZE/BANDAGES/DRESSINGS) ×2 IMPLANT
DURAPREP 26ML APPLICATOR (WOUND CARE) ×2 IMPLANT
ELECT REM PT RETURN 9FT ADLT (ELECTROSURGICAL) ×2
ELECTRODE REM PT RTRN 9FT ADLT (ELECTROSURGICAL) ×1 IMPLANT
EXTRACTOR VACUUM KIWI (MISCELLANEOUS) IMPLANT
GLOVE BIO SURGEON ST LM GN SZ9 (GLOVE) ×2 IMPLANT
GLOVE BIOGEL PI IND STRL 7.0 (GLOVE) ×1 IMPLANT
GLOVE BIOGEL PI IND STRL 9 (GLOVE) ×1 IMPLANT
GLOVE BIOGEL PI INDICATOR 7.0 (GLOVE) ×1
GLOVE BIOGEL PI INDICATOR 9 (GLOVE) ×1
GOWN STRL REUS W/TWL 2XL LVL3 (GOWN DISPOSABLE) ×2 IMPLANT
GOWN STRL REUS W/TWL LRG LVL3 (GOWN DISPOSABLE) ×4 IMPLANT
NEEDLE HYPO 25X5/8 SAFETYGLIDE (NEEDLE) IMPLANT
NS IRRIG 1000ML POUR BTL (IV SOLUTION) ×2 IMPLANT
PACK C SECTION WH (CUSTOM PROCEDURE TRAY) ×2 IMPLANT
PAD OB MATERNITY 4.3X12.25 (PERSONAL CARE ITEMS) ×2 IMPLANT
PENCIL SMOKE EVAC W/HOLSTER (ELECTROSURGICAL) ×2 IMPLANT
RTRCTR C-SECT PINK 25CM LRG (MISCELLANEOUS) IMPLANT
RTRCTR C-SECT PINK 34CM XLRG (MISCELLANEOUS) IMPLANT
STRIP CLOSURE SKIN 1/2X4 (GAUZE/BANDAGES/DRESSINGS) ×2 IMPLANT
SUT MNCRL 0 VIOLET CTX 36 (SUTURE) ×2 IMPLANT
SUT MONOCRYL 0 CTX 36 (SUTURE) ×2
SUT PLAIN 2 0 XLH (SUTURE) ×2 IMPLANT
SUT VIC AB 0 CT1 27 (SUTURE) ×4
SUT VIC AB 0 CT1 27XBRD ANBCTR (SUTURE) ×2 IMPLANT
SUT VIC AB 2-0 CT1 27 (SUTURE) ×4
SUT VIC AB 2-0 CT1 TAPERPNT 27 (SUTURE) ×2 IMPLANT
SUT VIC AB 4-0 KS 27 (SUTURE) ×2 IMPLANT
SYR BULB IRRIGATION 50ML (SYRINGE) IMPLANT
TOWEL OR 17X24 6PK STRL BLUE (TOWEL DISPOSABLE) ×2 IMPLANT
TRAY FOLEY BAG SILVER LF 14FR (SET/KITS/TRAYS/PACK) ×2 IMPLANT

## 2017-03-09 NOTE — Transfer of Care (Cosign Needed)
Immediate Anesthesia Transfer of Care Note  Patient: Debbie Young  Procedure(s) Performed: REPEAT CESAREAN SECTION WITH BILATERAL TUBAL LIGATION (Bilateral Abdomen)  Patient Location: PACU  Anesthesia Type:Spinal  Level of Consciousness: awake, alert  and oriented  Airway & Oxygen Therapy: Patient Spontanous Breathing  Post-op Assessment: Report given to RN and Post -op Vital signs reviewed and stable  Post vital signs: Reviewed and stable  Last Vitals:  Vitals:   03/09/17 0717  BP: 130/83  Pulse: 80  Resp: 18  Temp: 36.7 C    Last Pain:  Vitals:   03/09/17 0717  TempSrc: Oral         Complications: No apparent anesthesia complications

## 2017-03-09 NOTE — Anesthesia Postprocedure Evaluation (Signed)
Anesthesia Post Note  Patient: Debbie Young  Procedure(s) Performed: REPEAT CESAREAN SECTION WITH BILATERAL TUBAL LIGATION (Bilateral Abdomen)     Patient location during evaluation: PACU Anesthesia Type: Spinal Level of consciousness: oriented and awake and alert Pain management: pain level controlled Vital Signs Assessment: post-procedure vital signs reviewed and stable Respiratory status: spontaneous breathing, respiratory function stable and patient connected to nasal cannula oxygen Cardiovascular status: blood pressure returned to baseline and stable Postop Assessment: no headache, no backache and no apparent nausea or vomiting Anesthetic complications: no    Last Vitals:  Vitals:   03/09/17 1115 03/09/17 1130  BP: 107/68 101/62  Pulse: 74 (!) 58  Resp: (!) 22 18  Temp:    SpO2: 100% 97%    Last Pain:  Vitals:   03/09/17 1100  TempSrc: Oral   Pain Goal:                 Jaan Fischel S

## 2017-03-09 NOTE — Anesthesia Procedure Notes (Signed)
Spinal  Patient location during procedure: OR Start time: 03/09/2017 8:30 AM End time: 03/09/2017 8:40 AM Staffing Anesthesiologist: Marcie Bal, Nivek Powley Performed: anesthesiologist  Preanesthetic Checklist Completed: patient identified, surgical consent, pre-op evaluation, timeout performed, IV checked, risks and benefits discussed and monitors and equipment checked Spinal Block Patient position: sitting Prep: DuraPrep Patient monitoring: cardiac monitor, continuous pulse ox and blood pressure Approach: midline Location: L3-4 Injection technique: single-shot Needle Needle type: Pencan  Needle gauge: 24 G Needle length: 9 cm Assessment Sensory level: T10 Additional Notes Functioning IV was confirmed and monitors were applied. Sterile prep and drape, including hand hygiene and sterile gloves were used. The patient was positioned and the spine was prepped. The skin was anesthetized with lidocaine.  Free flow of clear CSF was obtained prior to injecting local anesthetic into the CSF.  The spinal needle aspirated freely following injection.  The needle was carefully withdrawn.  The patient tolerated the procedure well.

## 2017-03-09 NOTE — Addendum Note (Signed)
Addendum  created 03/09/17 1457 by Asher Muir, CRNA   Sign clinical note

## 2017-03-09 NOTE — Anesthesia Postprocedure Evaluation (Signed)
Anesthesia Post Note  Patient: Debbie Young  Procedure(s) Performed: REPEAT CESAREAN SECTION WITH BILATERAL TUBAL LIGATION (Bilateral Abdomen)     Patient location during evaluation: Mother Baby Anesthesia Type: Spinal Level of consciousness: awake Pain management: satisfactory to patient Vital Signs Assessment: post-procedure vital signs reviewed and stable Respiratory status: spontaneous breathing Cardiovascular status: stable Anesthetic complications: no    Last Vitals:  Vitals:   03/09/17 1230 03/09/17 1340  BP: 107/64 108/61  Pulse: 76 71  Resp: 20 18  Temp: 36.9 C 36.5 C  SpO2: 98% 98%    Last Pain:  Vitals:   03/09/17 1340  TempSrc: Oral  PainSc: 2    Pain Goal:                 [employer]

## 2017-03-09 NOTE — Progress Notes (Signed)
FHR dopplered range: 136-140 at [address], [place] 03/09/2017 7:37 AM

## 2017-03-09 NOTE — Anesthesia Preprocedure Evaluation (Signed)
Anesthesia Evaluation  Patient identified by MRN, date of birth, ID band Patient awake    Reviewed: Allergy & Precautions, H&P , NPO status , Patient's Chart, lab work & pertinent test results  Airway Mallampati: II   Neck ROM: full    Dental   Pulmonary Current Smoker,    breath sounds clear to auscultation       Cardiovascular negative cardio ROS   Rhythm:regular Rate:Normal     Neuro/Psych Seizures -,  PSYCHIATRIC DISORDERS Anxiety    GI/Hepatic   Endo/Other  obese  Renal/GU      Musculoskeletal   Abdominal   Peds  Hematology   Anesthesia Other Findings   Reproductive/Obstetrics (+) Pregnancy                             Anesthesia Physical Anesthesia Plan  ASA: II  Anesthesia Plan: Spinal   Post-op Pain Management:    Induction: Intravenous  PONV Risk Score and Plan: 1 and Ondansetron, Treatment may vary due to age or medical condition and Scopolamine patch - Pre-op  Airway Management Planned: Simple Face Mask  Additional Equipment:   Intra-op Plan:   Post-operative Plan:   Informed Consent: I have reviewed the patients History and Physical, chart, labs and discussed the procedure including the risks, benefits and alternatives for the proposed anesthesia with the patient or authorized representative who has indicated his/her understanding and acceptance.     Plan Discussed with: CRNA, Anesthesiologist and Surgeon  Anesthesia Plan Comments:         Anesthesia Quick Evaluation

## 2017-03-09 NOTE — H&P (Signed)
Debbie Young is a 46 y.o. female presenting for repeat cesarean section and bilateral tubal ligation at 39 0 wks. Pt has been counselled and consented.. OB History    Gravida Para Term Preterm AB Living   4 1 0 1 2 1    SAB TAB Ectopic Multiple Live Births   2       1     Past Medical History:  Diagnosis Date  . Anxiety   . Cellulitis 04/2001   LEFT FOOT  . Hematuria    BENIGN--SAW UROLOGIST  . History of abnormal cervical Pap smear 04/30/2015  . LGSIL (low grade squamous intraepithelial dysplasia) 1997  . Miscarriage 08/02/2013  . Patient desires pregnancy 11/18/2013  . Seizure disorder ([place])    AS A CHILD--NO MEDS SINCE 14 YO  . Smoker   . Vaginal Pap smear, abnormal    Past Surgical History:  Procedure Laterality Date  . [place]  . CERVICAL BIOPSY  W/ LOOP ELECTRODE EXCISION  07/2002  . CESAREAN SECTION    . CHOLECYSTECTOMY    . COLPOSCOPY    . REFRACTIVE SURGERY  2000  . TONSILLECTOMY AND ADENOIDECTOMY     Family History: family history includes Anuerysm in her maternal grandmother; Asthma in her son; Breast cancer in her maternal aunt and maternal grandmother; Breast cancer (age of onset: 57) in her mother; Cancer in her father; Fibromyalgia in her mother; Heart disease in her maternal grandmother, paternal grandfather, and paternal grandmother; Heart failure in her maternal grandmother; Hypertension in her maternal grandfather; Stroke in her maternal grandfather; Von Willebrand disease in her mother. Social History:  reports that she has been smoking Cigarettes.  She has a 5.00 pack-year smoking history. She has never used smokeless tobacco. She reports that she does not drink alcohol or use drugs.     Maternal Diabetes: No Genetic Screening: Normal Maternal Ultrasounds/Referrals: Normal Fetal Ultrasounds or other Referrals:  None Maternal Substance Abuse:  No Significant Maternal Medications:  None Significant Maternal Lab Results:  Lab values  include: Group B Strep negative Other Comments:  None  Review of Systems  Constitutional: Negative.   HENT: Negative.   Gastrointestinal: Negative.   Genitourinary: Negative.    History   Blood pressure 130/83, pulse 80, temperature 98.1 F (36.7 C), temperature source Oral, resp. rate 18, height 5\' 4"  (1.626 m), weight 246 lb (111.6 kg), last menstrual period 06/08/2016. Exam Physical Exam  Constitutional: She is oriented to person, place, and time. She appears well-developed and well-nourished.  HENT:  Head: Normocephalic.  Eyes: Pupils are equal, round, and reactive to light.  Neck: Normal range of motion.  Cardiovascular: Normal rate.   Respiratory: Effort normal.  GI: Soft.  Gravid uterus c/w dates. Well healed c/s scar  Neurological: She is alert and oriented to person, place, and time.  Skin: Skin is warm and dry.  Psychiatric: She has a normal mood and affect. Her behavior is normal.    Prenatal labs: ABO, Rh: --/--/A POS (10/17 1032) Antibody: NEG (10/17 1032) Rubella: 2.95 (04/04 1204) RPR: Non Reactive (10/17 1030)  HBsAg: Negative (04/04 1204)  HIV:    GBS:   neg CBC    Component Value Date/Time   WBC 8.1 03/08/2017 1030   RBC 4.13 03/08/2017 1030   HGB 12.5 03/08/2017 1030   HGB 11.1 12/14/2016 0918   HCT 38.3 03/08/2017 1030   HCT 33.0 (L) 12/14/2016 0918   PLT 215 03/08/2017 1030   PLT 200 12/14/2016  0918   MCV 92.7 03/08/2017 1030   MCV 92 12/14/2016 0918   MCH 30.3 03/08/2017 1030   MCHC 32.6 03/08/2017 1030   RDW 13.8 03/08/2017 1030   RDW 13.1 12/14/2016 0918   LYMPHSABS 2.6 07/27/2013 2218   MONOABS 0.6 07/27/2013 2218   EOSABS 0.3 07/27/2013 2218   BASOSABS 0.0 07/27/2013 2218     Assessment/Plan: preg 39 wk Prior c/s for repeat Elective permanent sterilization filshie clips   Debbie Young 03/09/2017, 8:01 AM

## 2017-03-10 LAB — CBC
HEMATOCRIT: 29.9 % — AB (ref 36.0–46.0)
HEMOGLOBIN: 10 g/dL — AB (ref 12.0–15.0)
MCH: 31.1 pg (ref 26.0–34.0)
MCHC: 33.4 g/dL (ref 30.0–36.0)
MCV: 92.9 fL (ref 78.0–100.0)
Platelets: 176 10*3/uL (ref 150–400)
RBC: 3.22 MIL/uL — ABNORMAL LOW (ref 3.87–5.11)
RDW: 13.8 % (ref 11.5–15.5)
WBC: 9.9 10*3/uL (ref 4.0–10.5)

## 2017-03-10 LAB — BIRTH TISSUE RECOVERY COLLECTION (PLACENTA DONATION)

## 2017-03-10 NOTE — Lactation Note (Addendum)
This note was copied from a baby's chart. Lactation Consultation Note Mom has a 44 yr old that she BF for 1 week then had to stop d/t a foot infection that spread to her knee and was hospitalized for 3 days.  Mom currently holding baby when [place] entered rm. Baby had sleeper, and blanket on. Discussed importance of STS. Mom states baby is doing well, will BF for a while then hold the nipple in mouth. Educated on newborn behavior, feeding habits, I&O, cluster feeding, supply and demand.  Mom has large everted nipples w/ easily expressed colostrum. Discussed spoon feeding.  Mom encouraged to feed baby 8-12 times/24 hours and with feeding cues. Mom sleepy. Will need f/u to assist in latching in football position d/t hurting to cradle d/t c section. Encouraged to call for assistance or questions.  [place] brochure given w/resources, support groups and [place] services. Patient Name: Debbie Young Today's Date: 03/10/2017 Reason for consult: Initial assessment   Maternal Data Does the patient have breastfeeding experience prior to this delivery?: Yes  Feeding    LATCH Score       Type of Nipple: Everted at rest and after stimulation  Comfort (Breast/Nipple): Soft / non-tender        Interventions Interventions: Breast feeding basics reviewed  Lactation Tools Discussed/Used     Consult Status Consult Status: Follow-up Date: 03/10/17 Follow-up type: In-patient    Theodoro Kalata 03/10/2017, 6:57 AM

## 2017-03-10 NOTE — Lactation Note (Signed)
This note was copied from a baby's chart. Lactation Consultation Note  Patient Name: Debbie Young Today's Date: 03/10/2017 Reason for consult: Follow-up assessment   Follow up with mom of 61 hour old infant. Infant with 3 BF for 10-15 minutes, 6 BF attempts, 5 voids and 1 stool in last 24 hours. Infant weight 8 lb 2.5 oz with 5% weight loss since birth. LATCH score 6.   Mom reports she feels infant is feeding well. Mom denies nipple tenderness with latch. Mom with wide spaced breasts with everted nipples. Mom reports she has shown how to hand express. Enc mom to hand express before feeding.   Mom latched infant to the left breast in the cradle hold, Enc mom to use cross cradle hold to latch infant. Infant was noted to be too deeply latched, enc mom to relax hand to allow infant breathing space. Infant fed for 5 minutes and pulled off, mom said " I guess she is done". Infant was still cueing to feed. Enc mom to relatch her to the left breast, infant was still feeding when [place] left room. Enc mom to use cross cradle hold to latch, she still used cradle hold and was advised to relax arm to allow infant breathing space. Infant was noted to have intermittent swallows at the breast. Enc mom to massage/compress breast with feeding to increase milk flow. Mom denied nipple pain/tenderness with feeding.   Enc mom to feed infant STS 8-12 x in 24 hours at first feeding cues for as long as infant wants. Enc mom to finish one breast and then offer the second breast with each feeding. Enc mom to use pillow and head support with feeding.   Reviewed I/O, Signs of dehydration in the infant, Engorgement prevention/treatment and breast milk handling and storage. Reviewed [place], mom aware of IP/OP Services, BF Support Groups and [place] phone #. Enc mom to call out for feeding assistance as needed. Mom reports she has no questions/concerns at this time.      Maternal Data Formula Feeding for Exclusion: No Has  patient been taught Hand Expression?: Yes Does the patient have breastfeeding experience prior to this delivery?: Yes  Feeding Feeding Type: Breast Fed Length of feed: 15 min  LATCH Score Latch: Grasps breast easily, tongue down, lips flanged, rhythmical sucking.  Audible Swallowing: A few with stimulation  Type of Nipple: Everted at rest and after stimulation  Comfort (Breast/Nipple): Soft / non-tender  Hold (Positioning): Assistance needed to correctly position infant at breast and maintain latch.  LATCH Score: 8  Interventions Interventions: Breast feeding basics reviewed;Support pillows;Assisted with latch;Position options;Skin to skin;Expressed milk;Breast compression  Lactation Tools Discussed/Used WIC Program: No   Consult Status Consult Status: Complete Date: 03/10/17 Follow-up type: Call as needed    Debbie Young 03/10/2017, 10:50 AM

## 2017-03-10 NOTE — Progress Notes (Signed)
POSTPARTUM PROGRESS NOTE  Post Operation Day #1  Subjective:  Debbie Young is a 46 y.o. [id number] s/p rLTCS w/BTL at [redacted]w[redacted]d.  No acute events overnight.  Pt denies problems with ambulating, voiding or po intake.  She denies nausea or vomiting.  Pain is moderately controlled.  She has had flatus. She has not had bowel movement.  Lochia Minimal.  Nursing stated that her dressing was changed (pressure dressing) due to saturation.   Objective: Blood pressure (!) 116/51, pulse 71, temperature 97.9 F (36.6 C), temperature source Oral, resp. rate 18, height 5\' 4"  (1.626 m), weight 246 lb (111.6 kg), last menstrual period 06/08/2016, SpO2 97 %, unknown if currently breastfeeding.  Physical Exam:  General: alert, cooperative and no distress Chest: no respiratory distress Heart:regular rate, distal pulses intact Abdomen: soft, nontender, +BS Uterine Fundus: firm, appropriately tender DVT Evaluation: mechanical compression device present bilaterally Extremities: minial edema Skin: warm, dry; incision w/ pressure dressing   Recent Labs  03/08/17 1030 03/10/17 0540  HGB 12.5 10.0*  HCT 38.3 29.9*    Assessment/Plan: Debbie Young is a 46 y.o. [id number] s/p  rLTCS w/BTL at [redacted]w[redacted]d.  POD#1 - Doing well Contraception: BTL Feeding: Breast Dispo: Plan for discharge tomorrow.   LOS: 1 day   [place] 03/10/2017, 9:23 AM   I have seen and examined this patient and agree with the management plan.

## 2017-03-11 MED ORDER — SENNOSIDES-DOCUSATE SODIUM 8.6-50 MG PO TABS: 2.0000 | ORAL_TABLET | Freq: Every evening | ORAL | 0 refills | Status: AC | PRN

## 2017-03-11 MED ORDER — OXYCODONE HCL 5 MG PO TABS: 5.0000 mg | ORAL_TABLET | ORAL | 0 refills | Status: DC | PRN

## 2017-03-11 MED ORDER — IBUPROFEN 600 MG PO TABS: 600.0000 mg | ORAL_TABLET | Freq: Four times a day (QID) | ORAL | 1 refills | Status: DC | PRN

## 2017-03-11 NOTE — Lactation Note (Addendum)
This note was copied from a baby's chart. Lactation Consultation Note Mom asking for formula stating she doesn't think the baby is getting enough or that she is satisified. I reminded her of the cluster feeding that I talked about. Encouraged to assess breast before and after BF for colostrum transfer. Discussed baby's output shows if baby is getting anything. Baby has had 11 voids and 6 stools in 41 hours. That's great!  Mom agitated stating that she knows about colostrum and she has a pump at home but right now she wants formula to make sure the baby is getting something because she acta hungry.  LC gave Alimentum and supplementing information sheet. Discussed care of giving formula, formula care. FOB stated they just wanted to give her something they can measure,   Patient Name: Debbie Young Today's Date: 03/11/2017 Reason for consult: Follow-up assessment   Maternal Data    Feeding Feeding Type: Breast Fed Length of feed: 10 min  LATCH Score Latch: Grasps breast easily, tongue down, lips flanged, rhythmical sucking.  Audible Swallowing: A few with stimulation  Type of Nipple: Everted at rest and after stimulation  Comfort (Breast/Nipple): Soft / non-tender  Hold (Positioning): Assistance needed to correctly position infant at breast and maintain latch.  LATCH Score: 8  Interventions    Lactation Tools Discussed/Used     Consult Status Consult Status: Follow-up Date: 03/12/17 Follow-up type: In-patient    Debbie Young, Debbie Young 03/11/2017, 2:24 AM

## 2017-03-11 NOTE — Lactation Note (Signed)
This note was copied from a baby's chart. Lactation Consultation Note  Patient Name: Debbie Young Today's Date: 03/11/2017 Reason for consult: Follow-up assessment   Follow-up at 17 hrs old.  GA 39.1; BW 8 lbs 10 oz weight loss 10%.  Mom is P2.   Infant has breastfed x9 (10-20 min) + formula (started during night after [place] visit with formula education, mom requested) x4 (15-50 ml); voids-8 in 24 hrs/ 13 life; stools 8 in 24 hrs/ 9 life; LS-8 by RN. Upon entering room and introducing myself mom immediately stated that she has been seen several times by lactation.  LC verified that lactation had not been in there today to talk about discharge to home. Mom stated that she has been giving formula so that she can see how much the baby is receiving; mom stated that the baby is more settled and sleeps longer with formula. Mom then told LC she plans to pump and bottle feed. [place] asked what kind of pump she has at home, and mom stated she did not know the brand but that it came in a "black and grey box."  LC unsure of brand based on descriptions. [place] educated on supply and demand and encouraged mom to pump anytime infant receives a bottle and to pump at least 8+ times per day to maintain milk production. LC reviewed formula amounts on education sheet previously provided and encouraged to increase amount based on infant's need for increased milk intake as infant grows.   LC encouraged mom to ask questions, but mom did not have any questions. Encouraged to call after discharge if needed for questions.     Consult Status Consult Status: Complete    Merlene Laughter 03/11/2017, 2:08 PM

## 2017-03-11 NOTE — Discharge Instructions (Signed)

## 2017-03-11 NOTE — Discharge Summary (Signed)
OB Discharge Summary     Patient Name: Debbie Young DOB: [birth year] MRN: [id number]  Date of admission: 03/09/2017 Delivering MD: Jonnie Kind   Date of discharge: 03/11/2017  Admitting diagnosis: REPEAT CS;STERILIZATION Intrauterine pregnancy: [redacted]w[redacted]d     Secondary diagnosis:  Active Problems:   Supervision of high risk pregnancy, antepartum   History of cesarean delivery   Status post repeat low transverse cesarean section   Status post tubal ligation  Additional problems: hematuria, smoker, history of preterm pregnancy, advanced maternal age     Discharge diagnosis: Term Pregnancy Delivered                                                                                                Post partum procedures:none  Augmentation: Pitocin and Cytotec  Complications: None  Hospital course:  Schedule Cesarean Section  46 y.o. yo [id number] at [redacted]w[redacted]d was admitted to the hospital 03/09/2017 for scheduled repeat cesarean section due to Elective Repeat, and delivered a Viable  Membrane Rupture Time/Date: )9:10 AM ,03/09/2017   @Details  of operation can be found in separate operative Note. She also underwent BTL at the time.  Patient had an uncomplicated postpartum course. She is ambulating, tolerating a regular diet, passing flatus, and urinating well.  Patient is discharged home in stable condition on 03/11/17.                                    Physical exam  Vitals:   03/10/17 0515 03/10/17 0858 03/10/17 1810 03/11/17 0647  BP: (!) 104/52 (!) 116/51 (!) 123/52   Pulse: 69 71 67   Resp: 18 18 17    Temp: 98 F (36.7 C) 97.9 F (36.6 C) 98.1 F (36.7 C) 97.9 F (36.6 C)  TempSrc: Oral Oral Oral   SpO2:  97%    Weight:      Height:       General: alert, cooperative and no distress Lochia: appropriate Uterine Fundus: firm Incision: Healing well with no significant drainage, No significant erythema, Dressing is clean, dry, and intact DVT Evaluation: No evidence of DVT seen  on physical exam. No cords or calf tenderness. Labs: Lab Results  Component Value Date   WBC 9.9 03/10/2017   HGB 10.0 (L) 03/10/2017   HCT 29.9 (L) 03/10/2017   MCV 92.9 03/10/2017   PLT 176 03/10/2017   CMP Latest Ref Rng & Units 07/27/2013  Glucose 70 - 99 mg/dL 107(H)  BUN 6 - 23 mg/dL 17  Creatinine 0.50 - 1.10 mg/dL 0.78  Sodium 137 - 147 mEq/L 139  Potassium 3.7 - 5.3 mEq/L 3.9  Chloride 96 - 112 mEq/L 101  CO2 19 - 32 mEq/L 25  Calcium 8.4 - 10.5 mg/dL 9.2    Discharge instruction: per After Visit Summary and "Baby and Me Booklet".  After visit meds:  Allergies as of 03/11/2017      Reactions   Prednisone Other (See Comments)   Paranoia      Medication List    TAKE  these medications   aspirin 81 MG chewable tablet Chew 81 mg by mouth every other day.   COLACE PO Take 2 capsules by mouth daily as needed (for constipation).   ibuprofen 600 MG tablet Commonly known as:  ADVIL,MOTRIN Take 1 tablet (600 mg total) by mouth every 6 (six) hours as needed.   oxyCODONE 5 MG immediate release tablet Commonly known as:  Oxy IR/ROXICODONE Take 1 tablet (5 mg total) by mouth every 4 (four) hours as needed (pain scale 4-7).   PRENATAL ADULT GUMMY/DHA/FA PO Take 1 tablet by mouth daily.   senna-docusate 8.6-50 MG tablet Commonly known as:  Senokot-S Take 2 tablets by mouth at bedtime as needed for mild constipation.   VENTOLIN HFA 108 (90 Base) MCG/ACT inhaler Generic drug:  albuterol Inhale 2 puffs into the lungs every 4 (four) hours as needed for wheezing or shortness of breath.            Discharge Care Instructions        Start     Ordered   03/11/17 0000  Change dressing (specify)    Comments:  Dressing change: You may shower with the dressing on. Please leave in place for 5-7 days. It will start to come off on its on. If it is still in place after 7 days you may remove all dressing and strips.   03/11/17 0851      Diet: routine diet  Activity:  Advance as tolerated. Pelvic rest for 6 weeks.   Outpatient follow up: PPV: Follow up Appt:Future Appointments Date Time Provider [place]  03/20/2017 12:00 PM Florian Buff, MD [facility]   Follow up Visit:No Follow-up on file.  Postpartum contraception: s/p Tubal Ligation  Newborn Data: Live born female  Birth Weight: 8 lb 10 oz (3912 g) APGAR: 66, 77  Newborn Delivery   Birth date/time:  03/09/2017 09:10:00 Delivery type:  C-Section, Low Transverse  C-section categorization:  Repeat     Baby Feeding: Breast Disposition:home with mother   03/11/2017 Nuala Alpha, DO  OB FELLOW DISCHARGE ATTESTATION  I have seen and examined this patient and agree with above documentation in the resident's note.   Gailen Shelter, MD OB Fellow 10:54 PM

## 2017-03-12 NOTE — Op Note (Signed)
03/09/2017  8:15 PM  PATIENT:  Debbie Young  46 y.o. female  PRE-OPERATIVE DIAGNOSIS:  REPEAT Cesarean Section ;STERILIZATION  POST-OPERATIVE DIAGNOSIS:  REPEAT Cesarean Section ;STERILIZATION  PROCEDURE:  Procedure(s): REPEAT CESAREAN SECTION WITH BILATERAL TUBAL LIGATION (Bilateral)  SURGEON:  Surgeon(s) and Role:    Glo Herring, Angelyn Punt, MD - Primary    * Katheren Shams, DO - Fellow  PHYSICIAN ASSISTANT:   ASSISTANTS: none   ANESTHESIA:   spinal  EBL:  884 mL   BLOOD ADMINISTERED:none  DRAINS: Urinary Catheter (Foley)   LOCAL MEDICATIONS USED:  NONE  SPECIMEN:  No Specimen  DISPOSITION OF SPECIMEN:  PATHOLOGY  COUNTS:  YES  TOURNIQUET:  * No tourniquets in log *  DICTATION: .Dragon Dictation  PLAN OF CARE: Admit to inpatient   PATIENT DISPOSITION:  PACU - hemodynamically stable.   Delay start of Pharmacological VTE agent (>24hrs) due to surgical blood loss or risk of bleeding: not applicable Details of procedure: Patient was taken operating room prepped and draped for lower abdominal surgery with spinal anesthesia introduced, abdomen prepped and draped. As per previous discussions we excised the old scar widely to 25 cm length of skin and underlying fatty tissue using a good layer of fatty tissue over the fascia. Fascia was then opened transversely sufficiently to identify the rectus muscles. The fascia was sharply dissected off the underlying rectus muscles. The omentum was stuck to the backside of the fascia suggesting the previous needle closure had not included closing the peritoneum itself. The omentum required significant amount of dissection off of the anterior abdominal wall and rectus muscles to freed up such that the [place] wound retractor could be placed in the abdominal cavity. Transverse to flap was difficult incision was then developed and then a transverse uterine incision performed by Dr. Gerarda Fraction under my direct supervision, opening the incision  sufficiently that cephalad and caudad traction allowed access to the fetal vertex which was rotated into the incision and delivered with fundal pressure. The cord was clamped at 1 minute, and the baby passed to the awaiting. Pediatricians. Cord blood gases were obtained and the placenta delivered with per day uterine massage. The uterus was swabbed out on the inside after deliver the placenta. The placenta been attached in the fundal portion and primarily in the left cornual area of the uterus. Membrane extraction appeared intact the uterus was then closed in a running locking first layer of 0 Monocryl followed by continuous running second layer imbricating the first. Each surgeon began with corner and sewed to the midline with a second layer. Tubal ligation. The fallopian tubes could be identified to their fimbriated end and a midportion of the tube was identified elevated and Filshie clip applied confirmed as properly located on each side. The remainder of the omental adhesions to the anterior abdominal wall then taken down to the omentum could be mobilized. The anterior peritoneum was closed side to side using 2-0 Vicryl, the fascia then closed using 0 Vicryl, the subcutaneous tissues were mobilized inferiorly so that the she could be reapproximated using 2 layers of horizontal mattress sutures of 2-0 plain suture followed by subcuticular 4-0 Vicryl closure the skin incisions. Patient tolerated procedure well and went to recovery room in good condition.

## 2017-03-13 ENCOUNTER — Telehealth: Payer: Self-pay | Admitting: Obstetrics and Gynecology

## 2017-03-13 ENCOUNTER — Telehealth: Payer: Self-pay | Admitting: Obstetrics & Gynecology

## 2017-03-13 NOTE — Telephone Encounter (Signed)
Patient called and left a message stating that she missed the nurses phone call, please contact pt

## 2017-03-13 NOTE — Telephone Encounter (Signed)
LMOVM returning call 

## 2017-03-13 NOTE — Telephone Encounter (Signed)
Patient called to report swelling in her lower extremities, more than from when she was pregnant. Informed patient that the swelling may get worse before it gets better especially after having a c/s. Encouraged to push fluids, empty bladder frequently and elevate legs when possible. Pt verbalized understanding.

## 2017-03-20 ENCOUNTER — Ambulatory Visit (INDEPENDENT_AMBULATORY_CARE_PROVIDER_SITE_OTHER): Payer: 59 | Admitting: Obstetrics & Gynecology

## 2017-03-20 ENCOUNTER — Encounter: Payer: Self-pay | Admitting: Obstetrics & Gynecology

## 2017-03-20 VITALS — BP 136/92 | HR 69 | Ht 64.0 in | Wt 229.0 lb

## 2017-03-20 DIAGNOSIS — Z9889 Other specified postprocedural states: Secondary | ICD-10-CM

## 2017-03-20 DIAGNOSIS — Z98891 History of uterine scar from previous surgery: Secondary | ICD-10-CM

## 2017-03-20 NOTE — Progress Notes (Signed)
  HPI: Patient returns for routine postoperative follow-up having undergone repeat section with tubal ligation on 03/09/2017.  The patient's immediate postoperative recovery has been unremarkable. Since hospital discharge the patient reports no problems.   Current Outpatient Prescriptions: ibuprofen (ADVIL,MOTRIN) 600 MG tablet, Take 1 tablet (600 mg total) by mouth every 6 (six) hours as needed., Disp: 40 tablet, Rfl: 1 VENTOLIN HFA 108 (90 BASE) MCG/ACT inhaler, Inhale 2 puffs into the lungs every 4 (four) hours as needed for wheezing or shortness of breath. , Disp: , Rfl:  aspirin 81 MG chewable tablet, Chew 81 mg by mouth every other day., Disp: , Rfl:  Docusate Sodium (COLACE PO), Take 2 capsules by mouth daily as needed (for constipation). , Disp: , Rfl:  oxyCODONE (OXY IR/ROXICODONE) 5 MG immediate release tablet, Take 1 tablet (5 mg total) by mouth every 4 (four) hours as needed (pain scale 4-7). (Patient not taking: Reported on 03/20/2017), Disp: 15 tablet, Rfl: 0 Prenatal MV & Min w/FA-DHA (PRENATAL ADULT GUMMY/DHA/FA PO), Take 1 tablet by mouth daily. , Disp: , Rfl:   No current facility-administered medications for this visit.     Blood pressure (!) 136/92, pulse 69, height 5\' 4"  (1.626 m), weight 229 lb (103.9 kg), last menstrual period 06/08/2016, currently breastfeeding.  Physical Exam: Incision clean dry intact Abdomen is benign  Diagnostic Tests:   Pathology:   Impression: S/p repeat section with BTL, [place] clips  Plan: Follow up pp visit 4 weeks  Follow up: 4  weeks  Florian Buff, MD

## 2017-03-20 NOTE — Addendum Note (Signed)
Addendum  created 03/20/17 0815 by Albertha Ghee, MD   Anesthesia Staff edited

## 2017-03-22 ENCOUNTER — Encounter: Payer: 59 | Admitting: Obstetrics and Gynecology

## 2017-04-17 ENCOUNTER — Encounter: Payer: Self-pay | Admitting: Women's Health

## 2017-04-17 ENCOUNTER — Ambulatory Visit (INDEPENDENT_AMBULATORY_CARE_PROVIDER_SITE_OTHER): Payer: 59 | Admitting: Women's Health

## 2017-04-17 DIAGNOSIS — Z98891 History of uterine scar from previous surgery: Secondary | ICD-10-CM

## 2017-04-17 NOTE — Progress Notes (Signed)
   POSTPARTUM VISIT Patient name: Debbie Young MRN [id number]  Date of birth: 09-09-1970 Chief Complaint:   Postpartum Care  History of Present Illness:   Debbie Young is a 46 y.o. [phone] Caucasian female being seen today for a postpartum visit. She is 5 weeks postpartum following a repeat cesarean section, low transverse incision w/ BTL at 39.0 gestational weeks. Anesthesia: spinal. I have fully reviewed the prenatal and intrapartum course. Pregnancy complicated by AMA, h/o preterm birth, prev c/s, h/o abnormal pap w/ LEEP 2004. Postpartum course has been uncomplicated. Does report small spot of incision that is slightly open. Denies fever/chills, pain or s/s infection. Bleeding on period. Bowel function is normal. Bladder function is normal.  Patient is not sexually active. Last sexual activity: prior to birth of baby.  Contraception method is s/p BTL.  [place] Postpartum Depression Screening: negative. Score 0.   Last pap 04/30/15.  Results were normal .  Patient's last menstrual period was 04/11/2017.  Baby's course has been uncomplicated. Baby is feeding by breast and bottle.  Review of Systems:   Pertinent items are noted in HPI Denies Abnormal vaginal discharge w/ itching/odor/irritation, headaches, visual changes, shortness of breath, chest pain, abdominal pain, severe nausea/vomiting, or problems with urination or bowel movements. Pertinent History Reviewed:  Reviewed past medical,surgical, obstetrical and family history.  Reviewed problem list, medications and allergies. OB History  Gravida Para Term Preterm AB Living  4 2 1 1 2 2   SAB TAB Ectopic Multiple Live Births  2     0 2    # Outcome Date GA Lbr Len/2nd Weight Sex Delivery Anes PTL Lv  4 Term 03/09/17 [redacted]w[redacted]d  8 lb 10 oz (3.912 kg) F CS-LTranv Spinal  LIV  3 SAB 2016             Birth Comments: System Generated. Please review and update pregnancy details.  2 SAB 08/02/13          1 Preterm 04/20/01 [redacted]w[redacted]d  7 lb  (3.175 kg) M CS-LTranv Spinal Y LIV     Physical Assessment:   Vitals:   04/17/17 1050  BP: 130/78  Pulse: 76  Weight: 214 lb (97.1 kg)  Height: 5\' 4"  (1.626 m)  Body mass index is 36.73 kg/m.       Physical Examination:   General appearance: alert, well appearing, and in no distress  Mental status: alert, oriented to person, place, and time  Skin: warm & dry   Cardiovascular: normal heart rate noted   Respiratory: normal respiratory effort, no distress   Breasts: deferred, no complaints   Abdomen: soft, non-tender, very small spot to Rt of center of incision that is open, no s/s infection- applied silver nitrate   Pelvic: VULVA: normal appearing vulva with no masses, tenderness or lesions, UTERUS: uterus is normal size, shape, consistency and nontender  Rectal: No hemorrhoids  Extremities: No edema       No results found for this or any previous visit (from the past 24 hour(s)).  Assessment & Plan:  1) Postpartum exam 2) 5 wks s/p RLTCS w/ BTL 3) Breast and bottlefeeding 4) Depression screening 5) Small open spot on incision> silver nitrate applied, let us know if not continuing to heal  Follow-up: Return in about 3 months (around 07/18/2017) for Pap & physical.   No orders of the defined types were placed in this encounter.   Tawnya Crook CNM, [facility] 04/17/2017 11:27 AM

## 2017-07-21 ENCOUNTER — Other Ambulatory Visit: Payer: 59 | Admitting: Women's Health

## 2017-07-26 DIAGNOSIS — Z1389 Encounter for screening for other disorder: Secondary | ICD-10-CM | POA: Diagnosis not present

## 2017-07-26 DIAGNOSIS — Z6835 Body mass index (BMI) 35.0-35.9, adult: Secondary | ICD-10-CM | POA: Diagnosis not present

## 2017-07-26 DIAGNOSIS — K5792 Diverticulitis of intestine, part unspecified, without perforation or abscess without bleeding: Secondary | ICD-10-CM | POA: Diagnosis not present

## 2017-07-26 DIAGNOSIS — R1032 Left lower quadrant pain: Secondary | ICD-10-CM | POA: Diagnosis not present

## 2017-07-28 ENCOUNTER — Other Ambulatory Visit: Payer: Self-pay

## 2017-07-28 ENCOUNTER — Observation Stay (HOSPITAL_COMMUNITY)
Admission: EM | Admit: 2017-07-28 | Discharge: 2017-07-29 | DRG: 392 | Disposition: A | Discharge status: Home / Self Care | Payer: 59 | Attending: Internal Medicine | Admitting: Internal Medicine

## 2017-07-28 ENCOUNTER — Encounter (HOSPITAL_COMMUNITY): Payer: Self-pay

## 2017-07-28 ENCOUNTER — Emergency Department (HOSPITAL_COMMUNITY): Payer: 59

## 2017-07-28 DIAGNOSIS — J45909 Unspecified asthma, uncomplicated: Secondary | ICD-10-CM | POA: Diagnosis not present

## 2017-07-28 DIAGNOSIS — Z888 Allergy status to other drugs, medicaments and biological substances status: Secondary | ICD-10-CM

## 2017-07-28 DIAGNOSIS — Z6834 Body mass index (BMI) 34.0-34.9, adult: Secondary | ICD-10-CM

## 2017-07-28 DIAGNOSIS — J45902 Unspecified asthma with status asthmaticus: Secondary | ICD-10-CM

## 2017-07-28 DIAGNOSIS — E876 Hypokalemia: Secondary | ICD-10-CM | POA: Diagnosis not present

## 2017-07-28 DIAGNOSIS — K5792 Diverticulitis of intestine, part unspecified, without perforation or abscess without bleeding: Secondary | ICD-10-CM | POA: Diagnosis not present

## 2017-07-28 DIAGNOSIS — F17210 Nicotine dependence, cigarettes, uncomplicated: Secondary | ICD-10-CM | POA: Diagnosis not present

## 2017-07-28 DIAGNOSIS — K5720 Diverticulitis of large intestine with perforation and abscess without bleeding: Principal | ICD-10-CM | POA: Diagnosis present

## 2017-07-28 DIAGNOSIS — E669 Obesity, unspecified: Secondary | ICD-10-CM | POA: Diagnosis not present

## 2017-07-28 DIAGNOSIS — R109 Unspecified abdominal pain: Secondary | ICD-10-CM | POA: Diagnosis not present

## 2017-07-28 LAB — LIPASE, BLOOD: Lipase: 26 U/L (ref 11–51)

## 2017-07-28 LAB — CBC
HCT: 40.3 % (ref 36.0–46.0)
HEMOGLOBIN: 12.6 g/dL (ref 12.0–15.0)
MCH: 29.4 pg (ref 26.0–34.0)
MCHC: 31.3 g/dL (ref 30.0–36.0)
MCV: 93.9 fL (ref 78.0–100.0)
Platelets: 269 10*3/uL (ref 150–400)
RBC: 4.29 MIL/uL (ref 3.87–5.11)
RDW: 12.4 % (ref 11.5–15.5)
WBC: 7.6 10*3/uL (ref 4.0–10.5)

## 2017-07-28 LAB — URINALYSIS, ROUTINE W REFLEX MICROSCOPIC
Bilirubin Urine: NEGATIVE
GLUCOSE, UA: NEGATIVE mg/dL
KETONES UR: 5 mg/dL — AB
NITRITE: NEGATIVE
PROTEIN: 100 mg/dL — AB
Specific Gravity, Urine: 1.03 (ref 1.005–1.030)
pH: 5 (ref 5.0–8.0)

## 2017-07-28 LAB — COMPREHENSIVE METABOLIC PANEL
ALK PHOS: 74 U/L (ref 38–126)
ALT: 21 U/L (ref 14–54)
AST: 17 U/L (ref 15–41)
Albumin: 3.9 g/dL (ref 3.5–5.0)
Anion gap: 13 (ref 5–15)
BILIRUBIN TOTAL: 0.8 mg/dL (ref 0.3–1.2)
BUN: 14 mg/dL (ref 6–20)
CALCIUM: 9.1 mg/dL (ref 8.9–10.3)
CO2: 25 mmol/L (ref 22–32)
Chloride: 100 mmol/L — ABNORMAL LOW (ref 101–111)
Creatinine, Ser: 0.82 mg/dL (ref 0.44–1.00)
Glucose, Bld: 99 mg/dL (ref 65–99)
Potassium: 3.6 mmol/L (ref 3.5–5.1)
Sodium: 138 mmol/L (ref 135–145)
TOTAL PROTEIN: 8 g/dL (ref 6.5–8.1)

## 2017-07-28 LAB — PREGNANCY, URINE: Preg Test, Ur: NEGATIVE

## 2017-07-28 MED ORDER — DEXTROSE-NACL 5-0.9 % IV SOLN
INTRAVENOUS | Status: DC
  Administered 2017-07-28 – 2017-07-29 (×2): via INTRAVENOUS

## 2017-07-28 MED ORDER — ENOXAPARIN SODIUM 40 MG/0.4ML ~~LOC~~ SOLN: 40.0000 mg | SUBCUTANEOUS | Status: DC

## 2017-07-28 MED ORDER — ONDANSETRON HCL 4 MG/2ML IJ SOLN
4.0000 mg | Freq: Once | INTRAMUSCULAR | Status: AC
  Administered 2017-07-28: 4 mg via INTRAVENOUS
  Filled 2017-07-28: qty 2

## 2017-07-28 MED ORDER — ACETAMINOPHEN 650 MG RE SUPP: 650.0000 mg | Freq: Four times a day (QID) | RECTAL | Status: DC | PRN

## 2017-07-28 MED ORDER — FENTANYL CITRATE (PF) 100 MCG/2ML IJ SOLN
100.0000 ug | Freq: Once | INTRAMUSCULAR | Status: AC
Start: 2017-07-28 — End: 2017-07-28
  Administered 2017-07-28: 100 ug via INTRAVENOUS
  Filled 2017-07-28: qty 2

## 2017-07-28 MED ORDER — ACETAMINOPHEN 325 MG PO TABS: 650.0000 mg | ORAL_TABLET | Freq: Four times a day (QID) | ORAL | Status: DC | PRN

## 2017-07-28 MED ORDER — PIPERACILLIN-TAZOBACTAM 3.375 G IVPB
3.3750 g | Freq: Three times a day (TID) | INTRAVENOUS | Status: DC
  Administered 2017-07-29 (×2): 3.375 g via INTRAVENOUS
  Filled 2017-07-28 (×2): qty 50

## 2017-07-28 MED ORDER — ONDANSETRON HCL 4 MG/2ML IJ SOLN: 4.0000 mg | Freq: Four times a day (QID) | INTRAMUSCULAR | Status: DC | PRN

## 2017-07-28 MED ORDER — IOPAMIDOL (ISOVUE-300) INJECTION 61%
100.0000 mL | Freq: Once | INTRAVENOUS | Status: AC | PRN
  Administered 2017-07-28: 100 mL via INTRAVENOUS

## 2017-07-28 MED ORDER — POTASSIUM CHLORIDE CRYS ER 20 MEQ PO TBCR
40.0000 meq | EXTENDED_RELEASE_TABLET | Freq: Once | ORAL | Status: AC
  Administered 2017-07-28: 40 meq via ORAL
  Filled 2017-07-28: qty 2

## 2017-07-28 MED ORDER — MORPHINE SULFATE (PF) 2 MG/ML IV SOLN: 1.0000 mg | INTRAVENOUS | Status: DC | PRN

## 2017-07-28 MED ORDER — SODIUM CHLORIDE 0.9 % IV BOLUS (SEPSIS)
1000.0000 mL | Freq: Once | INTRAVENOUS | Status: AC
  Administered 2017-07-28: 1000 mL via INTRAVENOUS

## 2017-07-28 MED ORDER — PANTOPRAZOLE SODIUM 40 MG IV SOLR
40.0000 mg | Freq: Two times a day (BID) | INTRAVENOUS | Status: DC
  Administered 2017-07-28 – 2017-07-29 (×2): 40 mg via INTRAVENOUS
  Filled 2017-07-28 (×3): qty 40

## 2017-07-28 MED ORDER — ONDANSETRON HCL 4 MG PO TABS: 4.0000 mg | ORAL_TABLET | Freq: Four times a day (QID) | ORAL | Status: DC | PRN

## 2017-07-28 MED ORDER — ALBUTEROL SULFATE (2.5 MG/3ML) 0.083% IN NEBU: 3.0000 mL | INHALATION_SOLUTION | RESPIRATORY_TRACT | Status: DC | PRN

## 2017-07-28 MED ORDER — PIPERACILLIN-TAZOBACTAM 3.375 G IVPB 30 MIN
3.3750 g | Freq: Once | INTRAVENOUS | Status: AC
  Administered 2017-07-28: 3.375 g via INTRAVENOUS
  Filled 2017-07-28: qty 50

## 2017-07-28 NOTE — ED Triage Notes (Signed)
Pt reports lower abdominal pain since Monday. Seen by PCP on Wednesday and given cipro and flagyl. Pain has increased last night. Pt reports nausea, poor appetitie.

## 2017-07-28 NOTE — H&P (Signed)
History and Physical    Debbie Young VHQ:[id number] DOB: Apr 14, 1971 DOA: 07/28/2017  PCP: [place], [place]   Patient coming from: Home  Chief Complaint: Abdominal pain.  HPI: Debbie Young is a 48 y.o. female with no significant past medical history.  Patient presents with abdominal pain for the last 4 days, localized on her left side, radiated to the contralateral side, which has been persistent, and worsening, dull in nature, 6/8 out of 10 in intensity, associated with abdominal distention, loss of appetite and fevers.    48 hours after her symptoms started, she was diagnosed with diverticulitis in the outpatient clinic, she was prescribed metronidazole and ciprofloxacin with improvement of fever but the abdominal pain, malaise, nausea and decreased p.o. intake persisted   ED Course: She received IV fluids, IV antibiotics.  CT of the abdomen with focal inflammation of the distal descending and proximal transverse colon consistent with diverticulitis.  Small focus of extraluminal gas.  Patient was referred for admission and further evaluation.  Review of Systems:  1. General: positive fevers, and chills, no weight gain or weight loss 2. ENT: No runny nose or sore throat, no hearing disturbances 3. Pulmonary: No dyspnea, cough, wheezing, or hemoptysis 4. Cardiovascular: No angina, claudication, lower extremity edema, pnd or orthopnea 5. Gastrointestinal: positive nausea, but no vomiting. Today diarrhea.  6. Hematology: No easy bruisability or frequent infections 7. Urology: No dysuria, hematuria or increased urinary frequency 8. Dermatology: No rashes. 9. Neurology: No seizures or paresthesias 10. Musculoskeletal: No joint pain or deformities  Past Medical History:  Diagnosis Date  . Anxiety   . Cellulitis 04/2001   LEFT FOOT  . Hematuria    BENIGN--SAW UROLOGIST  . History of abnormal cervical Pap smear 04/30/2015  . LGSIL (low grade squamous intraepithelial  dysplasia) 1997  . Miscarriage 08/02/2013  . Patient desires pregnancy 11/18/2013  . Seizure disorder ([place])    AS A CHILD--NO MEDS SINCE 67 YO  . Smoker   . Vaginal Pap smear, abnormal     Past Surgical History:  Procedure Laterality Date  . [place]  . CERVICAL BIOPSY  W/ LOOP ELECTRODE EXCISION  07/2002  . CESAREAN SECTION    . CESAREAN SECTION WITH BILATERAL TUBAL LIGATION Bilateral 03/09/2017   Procedure: REPEAT CESAREAN SECTION WITH BILATERAL TUBAL LIGATION;  Surgeon: Jonnie Kind, MD;  Location: [place];  Service: Obstetrics;  Laterality: Bilateral;  . CHOLECYSTECTOMY    . COLPOSCOPY    . REFRACTIVE SURGERY  2000  . TONSILLECTOMY AND ADENOIDECTOMY       reports that she has been smoking cigarettes.  She has a 5.00 pack-year smoking history. she has never used smokeless tobacco. She reports that she does not drink alcohol or use drugs.  Allergies  Allergen Reactions  . Prednisone Other (See Comments)    Paranoia    Family History  Problem Relation Age of Onset  . Breast cancer Mother 42  . Fibromyalgia Mother   . Von Willebrand disease Mother   . Breast cancer Maternal Aunt   . Breast cancer Maternal Grandmother   . Heart disease Maternal Grandmother   . Anuerysm Maternal Grandmother   . Heart failure Maternal Grandmother   . Hypertension Maternal Grandfather   . Stroke Maternal Grandfather   . Heart disease Paternal Grandmother   . Heart disease Paternal Grandfather   . Cancer Father        Myloma and [place] water   .  Asthma Son      Prior to Admission medications   Medication Sig Start Date End Date Taking? Authorizing Provider  ciprofloxacin (CIPRO) 500 MG tablet Take 500 mg by mouth 2 (two) times daily. 10 day course starting on 07/26/2017 07/26/17  Yes [provider]  docusate sodium (COLACE) 100 MG capsule Take 2 capsules by mouth daily as needed (for constipation).    Yes [provider]  metroNIDAZOLE (FLAGYL) 500 MG tablet Take 500 mg by mouth 2 (two) times daily. 10 day course starting on 07/26/2017 07/26/17  Yes [provider]  VENTOLIN HFA 108 (90 BASE) MCG/ACT inhaler Inhale 2 puffs into the lungs every 4 (four) hours as needed for wheezing or shortness of breath.  09/05/13  Yes [provider]    Physical Exam: Vitals:   07/28/17 1354 07/28/17 1650  BP: 135/76   Pulse: 85 78  Resp: 16   Temp: 98.8 F (37.1 C)   TempSrc: Oral   SpO2: 98% 99%  Weight: 89.4 kg (197 lb)     Constitutional: deconditioned Vitals:   07/28/17 1354 07/28/17 1650  BP: 135/76   Pulse: 85 78  Resp: 16   Temp: 98.8 F (37.1 C)   TempSrc: Oral   SpO2: 98% 99%  Weight: 89.4 kg (197 lb)    Eyes: PERRL, lids and conjunctivae normal Head normocephalic, nose and ears with no deformities ENMT: Mucous membranes are dry. Posterior pharynx clear of any exudate or lesions.Normal dentition.  Neck: normal, supple, no masses, no thyromegaly Respiratory: clear to auscultation bilaterally, no wheezing, no crackles. Normal respiratory effort. No accessory muscle use.  Cardiovascular: Regular rate and rhythm, no murmurs / rubs / gallops. No extremity edema. 2+ pedal pulses. No carotid bruits.  Abdomen: mild distended,  Tender to deep palpation at the lower quadrants, with no rebound or guarding, no masses palpated. No hepatosplenomegaly. Bowel sounds positive.  Musculoskeletal: no clubbing / cyanosis. No joint deformity upper and lower extremities. Good ROM, no contractures. Normal muscle tone.  Skin: no rashes, lesions, ulcers. No induration Neurologic: CN 2-12 grossly intact. Sensation intact, DTR normal. Strength 5/5 in all 4.     Labs on Admission: I have personally reviewed following labs and imaging studies  CBC: Recent Labs  Lab 07/28/17 1428  WBC 7.6  HGB 12.6  HCT 40.3  MCV 93.9  PLT 409   Basic Metabolic Panel: Recent Labs  Lab 07/28/17 1428  NA 138  K  3.6  CL 100*  CO2 25  GLUCOSE 99  BUN 14  CREATININE 0.82  CALCIUM 9.1   GFR: Estimated Creatinine Clearance: 94.8 mL/min (by C-G formula based on SCr of 0.82 mg/dL). Liver Function Tests: Recent Labs  Lab 07/28/17 1428  AST 17  ALT 21  ALKPHOS 74  BILITOT 0.8  PROT 8.0  ALBUMIN 3.9   Recent Labs  Lab 07/28/17 1428  LIPASE 26   No results for input(s): AMMONIA in the last 168 hours. Coagulation Profile: No results for input(s): INR, PROTIME in the last 168 hours. Cardiac Enzymes: No results for input(s): CKTOTAL, CKMB, CKMBINDEX, TROPONINI in the last 168 hours. BNP (last 3 results) No results for input(s): PROBNP in the last 8760 hours. HbA1C: No results for input(s): HGBA1C in the last 72 hours. CBG: No results for input(s): GLUCAP in the last 168 hours. Lipid Profile: No results for input(s): CHOL, HDL, LDLCALC, TRIG, CHOLHDL, LDLDIRECT in the last 72 hours. Thyroid Function Tests: No results for input(s): TSH,  T4TOTAL, FREET4, T3FREE, THYROIDAB in the last 72 hours. Anemia Panel: No results for input(s): VITAMINB12, FOLATE, FERRITIN, TIBC, IRON, RETICCTPCT in the last 72 hours. Urine analysis:    Component Value Date/Time   COLORURINE AMBER (A) 07/28/2017 1356   APPEARANCEUR HAZY (A) 07/28/2017 1356   APPEARANCEUR Clear 08/24/2016 1204   LABSPEC 1.030 07/28/2017 1356   PHURINE 5.0 07/28/2017 1356   GLUCOSEU NEGATIVE 07/28/2017 1356   HGBUR MODERATE (A) 07/28/2017 1356   BILIRUBINUR NEGATIVE 07/28/2017 1356   BILIRUBINUR Negative 08/24/2016 1204   KETONESUR 5 (A) 07/28/2017 1356   PROTEINUR 100 (A) 07/28/2017 1356   UROBILINOGEN 0.2 06/19/2013 1603   NITRITE NEGATIVE 07/28/2017 1356   LEUKOCYTESUR SMALL (A) 07/28/2017 1356   LEUKOCYTESUR Negative 08/24/2016 1204    Radiological Exams on Admission: Ct Abdomen Pelvis W Contrast  Result Date: 07/28/2017 CLINICAL DATA:  47 year old female with acute abdominal pain for 4 days. EXAM: CT ABDOMEN AND PELVIS  WITH CONTRAST TECHNIQUE: Multidetector CT imaging of the abdomen and pelvis was performed using the standard protocol following bolus administration of intravenous contrast. CONTRAST:  159mL ISOVUE-300 IOPAMIDOL (ISOVUE-300) INJECTION 61% COMPARISON:  08/09/2007 CT FINDINGS: Lower chest: No acute abnormality Hepatobiliary: The liver is unremarkable except for stable RIGHT hemangiomas. Patient is status post cholecystectomy. No biliary dilatation. Pancreas: Unremarkable Spleen: Unremarkable Adrenals/Urinary Tract: The kidneys, adrenal glands and bladder are unremarkable. Stomach/Bowel: Inflammation and mild wall thickening of the distal descending colon noted with a small focus of extraluminal gas (2:59). There is also focal wall thickening with adjacent inflammation of the proximal transverse colon (2: 43-48) without extraluminal gas. Both of these areas are most compatible with acute diverticulitis. There is no evidence of bowel obstruction or abscess. The appendix is normal. Vascular/Lymphatic: No significant vascular findings are present. No enlarged abdominal or pelvic lymph nodes. Reproductive: Uterus and bilateral adnexa are unremarkable except for tubal ligation clips. Other: No ascites. Musculoskeletal: No acute bony abnormality or suspicious bony lesion. IMPRESSION: 1. Focal inflammation adjacent to the distal descending colon (with small focus of extraluminal gas) and adjacent to the proximal transverse colon, most compatible with 2 concurrent areas of acute diverticulitis. No evidence of bowel obstruction or abscess. Correlation with the patient's colon cancer screening history is recommended. If screening is not up-to-date, appropriate screening should be considered when clinically able. Electronically Signed   By: Margarette Canada M.D.   On: 07/28/2017 16:05    EKG: Independently reviewed.   Assessment/Plan Active Problems:   Diverticulitis  47 year old female with no significant past medical  history, she had C-section with tubal ligation October 2018.  Presents with abdominal pain, lower quadrants, persistent over the last 4 days, associated with fevers and nausea.  Has been refractive to outpatient management with oral antibiotics.  On the initial physical examination temperature 98.8, blood pressure 135/76, heart rate 85, respiratory rate 16, oxygen saturation 98% on room air.  Dry mucous membranes, lungs clear to auscultation, heart S1-S2 present rhythmic, the abdomen is soft with mild distention, tender to deep palpation at the lower quadrants, no rebound no guarding, no lower extremity edema.  Sodium 138, potassium 3.6, chloride 100, bicarb 25, glucose 99, BUN 14, creatinine 0.82, white count 7.6, hemoglobin 12.6, hematocrit 40.3, platelets 269.  Urinalysis specific gravity 1.030, protein 100, pH 5.0, white cells 0-5.  CT of the abdomen with focal inflammation adjacent to the distal descending colon (with a small focus of extraluminal gas) and adjacent to the proximal transverse colon, most compatible with 2 concurrent  areas of acute diverticulitis.  No evidence of bowel obstruction or abscess.  Patient will be admitted to the hospital with a working diagnosis of acute diverticulitis, refractory to outpatient therapy.  1.  Acute diverticulitis.  Patient will be admitted to the medical ward, she will be placed on IV fluids, dextrose and normal saline at 75 ml per hour, clear liquid diet, IV analgesics with morphine, IV antiacids and as needed IV antiemetics.  Surgery was consulted in the emergency department.  Continue intravenous antibiotic therapy with Zosyn.  Follow-up on white cell count, temperature curve and cultures.  2.  Hypokalemia.  Target potassium of 4, we will give 40 mg potassium chloride, follow-up kidney function in the morning.  3.  Asthma.  Continue albuterol as needed.   DVT prophylaxis:  enoxaparin Code Status:  full  Family Communication: I spoke with patient's  husband at the bedside and all questions were addressed. Disposition Plan: Discharge home clinically stable. Consults called: Surgery was consulted in the emergency department Admission status: Inpatient   Lateka Rady Gerome Apley MD [facility] Pager [phone]  If 7PM-7AM, please contact night-coverage [email or web address] Password [facility]  07/28/2017, 5:35 PM

## 2017-07-28 NOTE — ED Notes (Signed)
To CT

## 2017-07-28 NOTE — ED Provider Notes (Signed)
[facility] EMERGENCY DEPARTMENT Provider Note   CSN: [id number] Arrival date & time: 07/28/17  1338     History   Chief Complaint Chief Complaint  Patient presents with  . Abdominal Pain    HPI Debbie Young is a 47 y.o. female.  HPI  47 year old female with a known history of a cesarean section that was performed in October 2018, did very well postoperatively, had tubal ligation at that time as well.  Baby is doing well.  The patient noticed that 5 days ago she started having some lower abdominal discomfort then developed some low-grade fevers as high as 99.9.  She was seen at her doctor's office and started on ciprofloxacin and Flagyl for presumed diverticulitis after a negative urinalysis.  She continues to have pain which has become gradually worsened and is now more diffuse as well as nausea which has not improved.  She tried taking fluids but has had minimal, no food, denies radiation to the back.  She denies coughing shortness of breath or chest pain and has no swelling of the legs, no diarrhea, no rectal bleeding, no dysuria.  Symptoms are gradually worsening.  Has no temperature over 100.4.  She was told that she would be scheduled for a CT scan, however at this time it has not yet been ordered.  She was instructed to come to the emergency department should her symptoms worsen. Past Medical History:  Diagnosis Date  . Anxiety   . Cellulitis 04/2001   LEFT FOOT  . Hematuria    BENIGN--SAW UROLOGIST  . History of abnormal cervical Pap smear 04/30/2015  . LGSIL (low grade squamous intraepithelial dysplasia) 1997  . Miscarriage 08/02/2013  . Patient desires pregnancy 11/18/2013  . Seizure disorder ([place])    AS A CHILD--NO MEDS SINCE 24 YO  . Smoker   . Vaginal Pap smear, abnormal     Patient Active Problem List   Diagnosis Date Noted  . Diverticulitis 07/28/2017  . Status post tubal ligation 03/09/2017  . History of cesarean delivery 08/24/2016  . History of preterm  delivery 08/24/2016  . History of abnormal cervical Pap smear 04/30/2015  . Miscarriage 08/02/2013  . LGSIL (low grade squamous intraepithelial dysplasia)   . Cellulitis   . Seizure disorder ([place])   . Hematuria   . Smoker     Past Surgical History:  Procedure Laterality Date  . [place]  . CERVICAL BIOPSY  W/ LOOP ELECTRODE EXCISION  07/2002  . CESAREAN SECTION    . CESAREAN SECTION WITH BILATERAL TUBAL LIGATION Bilateral 03/09/2017   Procedure: REPEAT CESAREAN SECTION WITH BILATERAL TUBAL LIGATION;  Surgeon: Jonnie Kind, MD;  Location: [facility];  Service: Obstetrics;  Laterality: Bilateral;  . CHOLECYSTECTOMY    . COLPOSCOPY    . REFRACTIVE SURGERY  2000  . TONSILLECTOMY AND ADENOIDECTOMY      OB History    Gravida Para Term Preterm AB Living   4 2 1 1 2 2    SAB TAB Ectopic Multiple Live Births   2     0 2       Home Medications    Prior to Admission medications   Medication Sig Start Date End Date Taking? Authorizing Provider  ciprofloxacin (CIPRO) 500 MG tablet Take 500 mg by mouth 2 (two) times daily. 10 day course starting on 07/26/2017 07/26/17  Yes [provider]  docusate sodium (COLACE) 100 MG capsule Take 2 capsules by mouth daily as needed (for  constipation).    Yes [provider]  metroNIDAZOLE (FLAGYL) 500 MG tablet Take 500 mg by mouth 2 (two) times daily. 10 day course starting on 07/26/2017 07/26/17  Yes [provider]  VENTOLIN HFA 108 (90 BASE) MCG/ACT inhaler Inhale 2 puffs into the lungs every 4 (four) hours as needed for wheezing or shortness of breath.  09/05/13  Yes [provider]    Family History Family History  Problem Relation Age of Onset  . Breast cancer Mother 56  . Fibromyalgia Mother   . Von Willebrand disease Mother   . Breast cancer Maternal Aunt   . Breast cancer Maternal Grandmother   . Heart disease Maternal Grandmother   . Anuerysm Maternal Grandmother   . Heart  failure Maternal Grandmother   . Hypertension Maternal Grandfather   . Stroke Maternal Grandfather   . Heart disease Paternal Grandmother   . Heart disease Paternal Grandfather   . Cancer Father        Myloma and [place] water   . Asthma Son     Social History Social History   Tobacco Use  . Smoking status: Current Every Day Smoker    Packs/day: 0.25    Years: 20.00    Pack years: 5.00    Types: Cigarettes  . Smokeless tobacco: Never Used  Substance Use Topics  . Alcohol use: No    Comment: occassional-not since +UPT  . Drug use: No     Allergies   Prednisone   Review of Systems Review of Systems  All other systems reviewed and are negative.    Physical Exam Updated Vital Signs BP 135/76 (BP Location: Left Arm)   Pulse 78   Temp 98.8 F (37.1 C) (Oral)   Resp 16   Wt 89.4 kg (197 lb)   SpO2 97%   BMI 33.81 kg/m   Physical Exam  Constitutional: She appears well-developed and well-nourished. No distress.  HENT:  Head: Normocephalic and atraumatic.  Mouth/Throat: Oropharynx is clear and moist. No oropharyngeal exudate.  Eyes: Conjunctivae and EOM are normal. Pupils are equal, round, and reactive to light. Right eye exhibits no discharge. Left eye exhibits no discharge. No scleral icterus.  Neck: Normal range of motion. Neck supple. No JVD present. No thyromegaly present.  Cardiovascular: Normal rate, regular rhythm, normal heart sounds and intact distal pulses. Exam reveals no gallop and no friction rub.  No murmur heard. Pulmonary/Chest: Effort normal and breath sounds normal. No respiratory distress. She has no wheezes. She has no rales.  Abdominal: Soft. Bowel sounds are normal. She exhibits no distension and no mass. There is tenderness ( Tender in the suprapubic and left lower quadrant, less tender diffusely, no guarding, no peritoneal signs, minimal tenderness in the upper abdomen.  Very soft.  No pulsating mass.).  Musculoskeletal:  Normal range of motion. She exhibits no edema or tenderness.  Lymphadenopathy:    She has no cervical adenopathy.  Neurological: She is alert. Coordination normal.  Skin: Skin is warm and dry. No rash noted. No erythema.  Psychiatric: She has a normal mood and affect. Her behavior is normal.  Nursing note and vitals reviewed.    ED Treatments / Results  Labs (all labs ordered are listed, but only abnormal results are displayed) Labs Reviewed  COMPREHENSIVE METABOLIC PANEL - Abnormal; Notable for the following components:      Result Value   Chloride 100 (*)    All other components within normal limits  URINALYSIS,  ROUTINE W REFLEX MICROSCOPIC - Abnormal; Notable for the following components:   Color, Urine AMBER (*)    APPearance HAZY (*)    Hgb urine dipstick MODERATE (*)    Ketones, ur 5 (*)    Protein, ur 100 (*)    Leukocytes, UA SMALL (*)    Bacteria, UA RARE (*)    Squamous Epithelial / LPF 0-5 (*)    All other components within normal limits  LIPASE, BLOOD  CBC  PREGNANCY, URINE     Radiology Ct Abdomen Pelvis W Contrast  Result Date: 07/28/2017 CLINICAL DATA:  47 year old female with acute abdominal pain for 4 days. EXAM: CT ABDOMEN AND PELVIS WITH CONTRAST TECHNIQUE: Multidetector CT imaging of the abdomen and pelvis was performed using the standard protocol following bolus administration of intravenous contrast. CONTRAST:  147mL ISOVUE-300 IOPAMIDOL (ISOVUE-300) INJECTION 61% COMPARISON:  08/09/2007 CT FINDINGS: Lower chest: No acute abnormality Hepatobiliary: The liver is unremarkable except for stable RIGHT hemangiomas. Patient is status post cholecystectomy. No biliary dilatation. Pancreas: Unremarkable Spleen: Unremarkable Adrenals/Urinary Tract: The kidneys, adrenal glands and bladder are unremarkable. Stomach/Bowel: Inflammation and mild wall thickening of the distal descending colon noted with a small focus of extraluminal gas (2:59). There is also focal wall  thickening with adjacent inflammation of the proximal transverse colon (2: 43-48) without extraluminal gas. Both of these areas are most compatible with acute diverticulitis. There is no evidence of bowel obstruction or abscess. The appendix is normal. Vascular/Lymphatic: No significant vascular findings are present. No enlarged abdominal or pelvic lymph nodes. Reproductive: Uterus and bilateral adnexa are unremarkable except for tubal ligation clips. Other: No ascites. Musculoskeletal: No acute bony abnormality or suspicious bony lesion. IMPRESSION: 1. Focal inflammation adjacent to the distal descending colon (with small focus of extraluminal gas) and adjacent to the proximal transverse colon, most compatible with 2 concurrent areas of acute diverticulitis. No evidence of bowel obstruction or abscess. Correlation with the patient's colon cancer screening history is recommended. If screening is not up-to-date, appropriate screening should be considered when clinically able. Electronically Signed   By: Margarette Canada M.D.   On: 07/28/2017 16:05    Procedures Procedures (including critical care time)  Medications Ordered in ED Medications  fentaNYL (SUBLIMAZE) injection 100 mcg (100 mcg Intravenous Given 07/28/17 1537)  ondansetron (ZOFRAN) injection 4 mg (4 mg Intravenous Given 07/28/17 1538)  sodium chloride 0.9 % bolus 1,000 mL (0 mLs Intravenous Stopped 07/28/17 1723)  iopamidol (ISOVUE-300) 61 % injection 100 mL (100 mLs Intravenous Contrast Given 07/28/17 1546)  piperacillin-tazobactam (ZOSYN) IVPB 3.375 g (0 g Intravenous Stopped 07/28/17 1805)     Initial Impression / Assessment and Plan / ED Course  I have reviewed the triage vital signs and the nursing notes.  Pertinent labs & imaging results that were available during my care of the patient were reviewed by me and considered in my medical decision making (see chart for details).     Abdominal pain, reported subjective fevers, CT scan has been  ordered to rule out other sources of the patient's symptoms.  The pain does not seem colicky like ureterolithiasis or ovarian torsion, it seems more likely to be a gastrointestinal source including possible diverticulitis.  The patient is agreeable to the CT scan, her lab work is unremarkable including no leukocytosis or renal dysfunction, urinalysis is clean without any signs of infection however there is a small amount of red blood cells.  CT scan pending, intravenous fentanyl and Zofran offered.  Case discussed with  Dr. Cathlean Sauer with hospitalist who will admit Will page and d/w Gen Surgery given possible intestinal perforation though no signs of abscess or fluid collection - just small am't of extraluminal air according to CT  Zosyn started Pt tolerating well - VS reassuring Will admit overnight for uncontrolled pain with diverticulitis with possible small perforation / microperforation  Final Clinical Impressions(s) / ED Diagnoses   Final diagnoses:  Diverticulitis of large intestine with perforation without bleeding      Noemi Chapel, MD 07/28/17 1805

## 2017-07-29 DIAGNOSIS — K5720 Diverticulitis of large intestine with perforation and abscess without bleeding: Secondary | ICD-10-CM

## 2017-07-29 LAB — COMPREHENSIVE METABOLIC PANEL
ALT: 15 U/L (ref 14–54)
AST: 15 U/L (ref 15–41)
Albumin: 3.1 g/dL — ABNORMAL LOW (ref 3.5–5.0)
Alkaline Phosphatase: 56 U/L (ref 38–126)
Anion gap: 9 (ref 5–15)
BUN: 10 mg/dL (ref 6–20)
CO2: 25 mmol/L (ref 22–32)
Calcium: 8.2 mg/dL — ABNORMAL LOW (ref 8.9–10.3)
Chloride: 108 mmol/L (ref 101–111)
Creatinine, Ser: 0.72 mg/dL (ref 0.44–1.00)
GFR calc Af Amer: >60 mL/min (ref >60)
GFR calc non Af Amer: >60 mL/min (ref >60)
Glucose, Bld: 112 mg/dL — ABNORMAL HIGH (ref 65–99)
Potassium: 3.5 mmol/L (ref 3.5–5.1)
Sodium: 142 mmol/L (ref 135–145)
Total Bilirubin: 0.8 mg/dL (ref 0.3–1.2)
Total Protein: 6.1 g/dL — ABNORMAL LOW (ref 6.5–8.1)

## 2017-07-29 LAB — CBC
HEMATOCRIT: 33.5 % — AB (ref 36.0–46.0)
HEMOGLOBIN: 10.7 g/dL — AB (ref 12.0–15.0)
MCH: 30.1 pg (ref 26.0–34.0)
MCHC: 31.9 g/dL (ref 30.0–36.0)
MCV: 94.4 fL (ref 78.0–100.0)
Platelets: 224 10*3/uL (ref 150–400)
RBC: 3.55 MIL/uL — ABNORMAL LOW (ref 3.87–5.11)
RDW: 12.3 % (ref 11.5–15.5)
WBC: 4.8 10*3/uL (ref 4.0–10.5)

## 2017-07-29 LAB — MAGNESIUM: Magnesium: 1.9 mg/dL (ref 1.7–2.4)

## 2017-07-29 MED ORDER — ONDANSETRON 8 MG PO TBDP: 8.0000 mg | ORAL_TABLET | Freq: Three times a day (TID) | ORAL | 0 refills | Status: DC | PRN

## 2017-07-29 MED ORDER — POTASSIUM CHLORIDE CRYS ER 20 MEQ PO TBCR
40.0000 meq | EXTENDED_RELEASE_TABLET | ORAL | Status: DC
  Administered 2017-07-29: 40 meq via ORAL
  Filled 2017-07-29: qty 2

## 2017-07-29 MED ORDER — HYDROCODONE-ACETAMINOPHEN 5-325 MG PO TABS: 1.0000 | ORAL_TABLET | Freq: Four times a day (QID) | ORAL | 0 refills | Status: DC | PRN

## 2017-07-29 NOTE — Consult Note (Signed)
Reason for Consult: Diverticulitis Referring Physician: Dr. Dyann Kief  Debbie Young is an 47 y.o. female.  HPI: Patient is a 47 year old white female who 3 days ago was seen by her primary care physician and started on ciprofloxacin and Flagyl for presumed diverticulitis.  Arrangements were being made for an outpatient CT scan.  Yesterday evening, she had worsening lower abdominal pain and nausea.  She presented to the emergency room and underwent a CT scan of the abdomen and pelvis.  She was found to have evidence for diverticulitis in both the proximal transverse colon and sigmoid colon regions.  There was one area of microperforation that was contained.  She had no abscess cavity or significant pneumoperitoneum.  She was brought into the hospital for control of her pain and nausea.  This morning, she states her abdominal pain has significantly eased.  She currently has a 2 out of 10 abdominal pain.  She is having small frequent bowel movements.  Her nausea is resolved.  This was her first episode.  She has never had a colonoscopy.  Past Medical History:  Diagnosis Date  . Anxiety   . Cellulitis 04/2001   LEFT FOOT  . Hematuria    BENIGN--SAW UROLOGIST  . History of abnormal cervical Pap smear 04/30/2015  . LGSIL (low grade squamous intraepithelial dysplasia) 1997  . Miscarriage 08/02/2013  . Patient desires pregnancy 11/18/2013  . Seizure disorder ([place])    AS A CHILD--NO MEDS SINCE 74 YO  . Smoker   . Vaginal Pap smear, abnormal     Past Surgical History:  Procedure Laterality Date  . [place]  . CERVICAL BIOPSY  W/ LOOP ELECTRODE EXCISION  07/2002  . CESAREAN SECTION    . CESAREAN SECTION WITH BILATERAL TUBAL LIGATION Bilateral 03/09/2017   Procedure: REPEAT CESAREAN SECTION WITH BILATERAL TUBAL LIGATION;  Surgeon: Jonnie Kind, MD;  Location: [facility];  Service: Obstetrics;  Laterality: Bilateral;  . CHOLECYSTECTOMY    . COLPOSCOPY    . REFRACTIVE  SURGERY  2000  . TONSILLECTOMY AND ADENOIDECTOMY      Family History  Problem Relation Age of Onset  . Breast cancer Mother 95  . Fibromyalgia Mother   . Von Willebrand disease Mother   . Breast cancer Maternal Aunt   . Breast cancer Maternal Grandmother   . Heart disease Maternal Grandmother   . Anuerysm Maternal Grandmother   . Heart failure Maternal Grandmother   . Hypertension Maternal Grandfather   . Stroke Maternal Grandfather   . Heart disease Paternal Grandmother   . Heart disease Paternal Grandfather   . Cancer Father        Myloma and [place] water   . Asthma Son     Social History:  reports that she has been smoking cigarettes.  She has a 5.00 pack-year smoking history. she has never used smokeless tobacco. She reports that she does not drink alcohol or use drugs.  Allergies:  Allergies  Allergen Reactions  . Prednisone Other (See Comments)    Paranoia    Medications: I have reviewed the patient's current medications.  Results for orders placed or performed during the hospital encounter of 07/28/17 (from the past 48 hour(s))  Urinalysis, Routine w reflex microscopic     Status: Abnormal   Collection Time: 07/28/17  1:56 PM  Result Value Ref Range   Color, Urine AMBER (A) YELLOW    Comment: BIOCHEMICALS MAY BE AFFECTED BY COLOR   APPearance  HAZY (A) CLEAR   Specific Gravity, Urine 1.030 1.005 - 1.030   pH 5.0 5.0 - 8.0   Glucose, UA NEGATIVE NEGATIVE mg/dL   Hgb urine dipstick MODERATE (A) NEGATIVE   Bilirubin Urine NEGATIVE NEGATIVE   Ketones, ur 5 (A) NEGATIVE mg/dL   Protein, ur 100 (A) NEGATIVE mg/dL   Nitrite NEGATIVE NEGATIVE   Leukocytes, UA SMALL (A) NEGATIVE   RBC / HPF 6-30 0 - 5 RBC/hpf   WBC, UA 0-5 0 - 5 WBC/hpf   Bacteria, UA RARE (A) NONE SEEN   Squamous Epithelial / LPF 0-5 (A) NONE SEEN   Mucus PRESENT     Comment: Performed at [facility], [address] [place], [place] [address]  Pregnancy, urine     Status:  None   Collection Time: 07/28/17  1:56 PM  Result Value Ref Range   Preg Test, Ur NEGATIVE NEGATIVE    Comment:        THE SENSITIVITY OF THIS METHODOLOGY IS >20 mIU/mL. Performed at [facility], [address], [place], [place] [address]   Lipase, blood     Status: None   Collection Time: 07/28/17  2:28 PM  Result Value Ref Range   Lipase 26 11 - 51 U/L    Comment: Performed at [facility], [address] [place], [place] [address]  Comprehensive metabolic panel     Status: Abnormal   Collection Time: 07/28/17  2:28 PM  Result Value Ref Range   Sodium 138 135 - 145 mmol/L   Potassium 3.6 3.5 - 5.1 mmol/L   Chloride 100 (L) 101 - 111 mmol/L   CO2 25 22 - 32 mmol/L   Glucose, Bld 99 65 - 99 mg/dL   BUN 14 6 - 20 mg/dL   Creatinine, Ser 0.82 0.44 - 1.00 mg/dL   Calcium 9.1 8.9 - 10.3 mg/dL   Total Protein 8.0 6.5 - 8.1 g/dL   Albumin 3.9 3.5 - 5.0 g/dL   AST 17 15 - 41 U/L   ALT 21 14 - 54 U/L   Alkaline Phosphatase 74 38 - 126 U/L   Total Bilirubin 0.8 0.3 - 1.2 mg/dL   GFR calc non Af Amer >60 >60 mL/min   GFR calc Af Amer >60 >60 mL/min    Comment: (NOTE) The eGFR has been calculated using the CKD EPI equation. This calculation has not been validated in all clinical situations. eGFR's persistently <60 mL/min signify possible Chronic Kidney Disease.    Anion gap 13 5 - 15    Comment: Performed at [facility], [address], [place], [place] [address]  CBC     Status: None   Collection Time: 07/28/17  2:28 PM  Result Value Ref Range   WBC 7.6 4.0 - 10.5 K/uL   RBC 4.29 3.87 - 5.11 MIL/uL   Hemoglobin 12.6 12.0 - 15.0 g/dL   HCT 40.3 36.0 - 46.0 %   MCV 93.9 78.0 - 100.0 fL   MCH 29.4 26.0 - 34.0 pg   MCHC 31.3 30.0 - 36.0 g/dL   RDW 12.4 11.5 - 15.5 %   Platelets 269 150 - 400 K/uL    Comment: Performed at [facility], [address], [place], [place] [address]  Culture, blood (routine x 2)     Status: None (Preliminary result)   Collection Time: 07/28/17  9:02  PM  Result Value Ref Range   Specimen Description LEFT ANTECUBITAL    Special Requests  BOTTLES DRAWN AEROBIC ONLY Blood Culture results may not be optimal due to an excessive volume of blood received in culture bottles   Culture      NO GROWTH < 12 HOURS Performed at [facility], [address], [place], [place] [address]    Report Status PENDING   Culture, blood (routine x 2)     Status: None (Preliminary result)   Collection Time: 07/28/17  9:08 PM  Result Value Ref Range   Specimen Description BLOOD LEFT ARM    Special Requests      BOTTLES DRAWN AEROBIC ONLY Blood Culture results may not be optimal due to an excessive volume of blood received in culture bottles   Culture      NO GROWTH < 12 HOURS Performed at [facility], [address], [place], [place] [address]    Report Status PENDING   Comprehensive metabolic panel     Status: Abnormal   Collection Time: 07/29/17  3:58 AM  Result Value Ref Range   Sodium 142 135 - 145 mmol/L   Potassium 3.5 3.5 - 5.1 mmol/L   Chloride 108 101 - 111 mmol/L   CO2 25 22 - 32 mmol/L   Glucose, Bld 112 (H) 65 - 99 mg/dL   BUN 10 6 - 20 mg/dL   Creatinine, Ser 0.72 0.44 - 1.00 mg/dL   Calcium 8.2 (L) 8.9 - 10.3 mg/dL   Total Protein 6.1 (L) 6.5 - 8.1 g/dL   Albumin 3.1 (L) 3.5 - 5.0 g/dL   AST 15 15 - 41 U/L   ALT 15 14 - 54 U/L   Alkaline Phosphatase 56 38 - 126 U/L   Total Bilirubin 0.8 0.3 - 1.2 mg/dL   GFR calc non Af Amer >60 >60 mL/min   GFR calc Af Amer >60 >60 mL/min    Comment: (NOTE) The eGFR has been calculated using the CKD EPI equation. This calculation has not been validated in all clinical situations. eGFR's persistently <60 mL/min signify possible Chronic Kidney Disease.    Anion gap 9 5 - 15    Comment: Performed at [facility], [address] [place], [place] [address]  CBC     Status: Abnormal   Collection Time: 07/29/17  3:58 AM  Result Value Ref Range   WBC 4.8 4.0 - 10.5 K/uL   RBC 3.55 (L) 3.87 -  5.11 MIL/uL   Hemoglobin 10.7 (L) 12.0 - 15.0 g/dL   HCT 33.5 (L) 36.0 - 46.0 %   MCV 94.4 78.0 - 100.0 fL   MCH 30.1 26.0 - 34.0 pg   MCHC 31.9 30.0 - 36.0 g/dL   RDW 12.3 11.5 - 15.5 %   Platelets 224 150 - 400 K/uL    Comment: Performed at [facility], [address] [place], [place] [address]  Magnesium     Status: None   Collection Time: 07/29/17  4:45 AM  Result Value Ref Range   Magnesium 1.9 1.7 - 2.4 mg/dL    Comment: Performed at [facility], [address], [place], [place] [address]    Ct Abdomen Pelvis W Contrast  Result Date: 07/28/2017 CLINICAL DATA:  47 year old female with acute abdominal pain for 4 days. EXAM: CT ABDOMEN AND PELVIS WITH CONTRAST TECHNIQUE: Multidetector CT imaging of the abdomen and pelvis was performed using the standard protocol following bolus administration of intravenous contrast. CONTRAST:  164m ISOVUE-300 IOPAMIDOL (ISOVUE-300) INJECTION 61% COMPARISON:  08/09/2007 CT FINDINGS: Lower chest: No acute abnormality Hepatobiliary: The liver  is unremarkable except for stable RIGHT hemangiomas. Patient is status post cholecystectomy. No biliary dilatation. Pancreas: Unremarkable Spleen: Unremarkable Adrenals/Urinary Tract: The kidneys, adrenal glands and bladder are unremarkable. Stomach/Bowel: Inflammation and mild wall thickening of the distal descending colon noted with a small focus of extraluminal gas (2:59). There is also focal wall thickening with adjacent inflammation of the proximal transverse colon (2: 43-48) without extraluminal gas. Both of these areas are most compatible with acute diverticulitis. There is no evidence of bowel obstruction or abscess. The appendix is normal. Vascular/Lymphatic: No significant vascular findings are present. No enlarged abdominal or pelvic lymph nodes. Reproductive: Uterus and bilateral adnexa are unremarkable except for tubal ligation clips. Other: No ascites. Musculoskeletal: No acute bony abnormality or suspicious  bony lesion. IMPRESSION: 1. Focal inflammation adjacent to the distal descending colon (with small focus of extraluminal gas) and adjacent to the proximal transverse colon, most compatible with 2 concurrent areas of acute diverticulitis. No evidence of bowel obstruction or abscess. Correlation with the patient's colon cancer screening history is recommended. If screening is not up-to-date, appropriate screening should be considered when clinically able. Electronically Signed   By: Margarette Canada M.D.   On: 07/28/2017 16:05    ROS:  Pertinent items are noted in HPI.  Blood pressure 106/65, pulse 66, temperature 97.9 F (36.6 C), temperature source Oral, resp. rate 18, height _0  (1.626 m), weight 201 lb 11.2 oz (91.5 kg), SpO2 97 %, currently breastfeeding. Physical Exam: Pleasant well-developed well-nourished white female no acute distress Head is normocephalic, atraumatic Lungs clear to auscultation with equal breath sounds bilaterally Heart examination reveals regular rate and rhythm without S3, S4, murmurs Abdomen is soft with minimal tenderness in the left lower quadrant and right upper abdomen to deep palpation.  No rigidity noted.  Bowel sounds present.  CT scan images personally reviewed  Assessment/Plan: Impression: Diverticulitis with microperforation, patient clinically improved.  Tolerating liquid diet well. Plan: Patient desires discharge, which is fine with me.  She should continue her ciprofloxacin and Flagyl as prescribed as an outpatient.  Would prescribe Vicodin for breakthrough pain and Zofran.  Given that there are 2 areas of her colon involved, I did tell the patient that she will need a colonoscopy as an outpatient.  She will follow-up with my office in 2 weeks as needed.  Discussed with Dr. Dyann Kief.  Aviva Signs 07/29/2017, 9:20 AM

## 2017-07-29 NOTE — Discharge Instructions (Signed)
Diverticulitis °Diverticulitis is infection or inflammation of small pouches (diverticula) in the colon that form due to a condition called diverticulosis. Diverticula can trap stool (feces) and bacteria, causing infection and inflammation. °Diverticulitis may cause severe stomach pain and diarrhea. It may lead to tissue damage in the colon that causes bleeding. The diverticula may also burst (rupture) and cause infected stool to enter other areas of the abdomen. °Complications of diverticulitis can include: °· Bleeding. °· Severe infection. °· Severe pain. °· Rupture (perforation) of the colon. °· Blockage (obstruction) of the colon. ° °What are the causes? °This condition is caused by stool becoming trapped in the diverticula, which allows bacteria to grow in the diverticula. This leads to inflammation and infection. °What increases the risk? °You are more likely to develop this condition if: °· You have diverticulosis. The risk for diverticulosis increases if: °? You are overweight or obese. °? You use tobacco products. °? You do not get enough exercise. °· You eat a diet that does not include enough fiber. High-fiber foods include fruits, vegetables, beans, nuts, and whole grains. ° °What are the signs or symptoms? °Symptoms of this condition may include: °· Pain and tenderness in the abdomen. The pain is normally located on the left side of the abdomen, but it may occur in other areas. °· Fever and chills. °· Bloating. °· Cramping. °· Nausea. °· Vomiting. °· Changes in bowel routines. °· Blood in your stool. ° °How is this diagnosed? °This condition is diagnosed based on: °· Your medical history. °· A physical exam. °· Tests to make sure there is nothing else causing your condition. These tests may include: °? Blood tests. °? Urine tests. °? Imaging tests of the abdomen, including X-rays, ultrasounds, MRIs, or CT scans. ° °How is this treated? °Most cases of this condition are mild and can be treated at home.  Treatment may include: °· Taking over-the-counter pain medicines. °· Following a clear liquid diet. °· Taking antibiotic medicines by mouth. °· Rest. ° °More severe cases may need to be treated at a hospital. Treatment may include: °· Not eating or drinking. °· Taking prescription pain medicine. °· Receiving antibiotic medicines through an IV tube. °· Receiving fluids and nutrition through an IV tube. °· Surgery. ° °When your condition is under control, your health care provider may recommend that you have a colonoscopy. This is an exam to look at the entire large intestine. During the exam, a lubricated, bendable tube is inserted into the anus and then passed into the rectum, colon, and other parts of the large intestine. A colonoscopy can show how severe your diverticula are and whether something else may be causing your symptoms. °Follow these instructions at home: °Medicines °· Take over-the-counter and prescription medicines only as told by your health care provider. These include fiber supplements, probiotics, and stool softeners. °· If you were prescribed an antibiotic medicine, take it as told by your health care provider. Do not stop taking the antibiotic even if you start to feel better. °· Do not drive or use heavy machinery while taking prescription pain medicine. °General instructions °· Follow a full liquid diet or another diet as directed by your health care provider. After your symptoms improve, your health care provider may tell you to change your diet. He or she may recommend that you eat a diet that contains at least 25 g (25 grams) of fiber daily. Fiber makes it easier to pass stool. Healthy sources of fiber include: °? Berries. One cup   contains 4-8 grams of fiber. °? Beans or lentils. One half cup contains 5-8 grams of fiber. °? Green vegetables. One cup contains 4 grams of fiber. °· Exercise for at least 30 minutes, 3 times each week. You should exercise hard enough to raise your heart rate and  break a sweat. °· Keep all follow-up visits as told by your health care provider. This is important. You may need a colonoscopy. °Contact a health care provider if: °· Your pain does not improve. °· You have a hard time drinking or eating food. °· Your bowel movements do not return to normal. °Get help right away if: °· Your pain gets worse. °· Your symptoms do not get better with treatment. °· Your symptoms suddenly get worse. °· You have a fever. °· You vomit more than one time. °· You have stools that are bloody, black, or tarry. °Summary °· Diverticulitis is infection or inflammation of small pouches (diverticula) in the colon that form due to a condition called diverticulosis. Diverticula can trap stool (feces) and bacteria, causing infection and inflammation. °· You are at higher risk for this condition if you have diverticulosis and you eat a diet that does not include enough fiber. °· Most cases of this condition are mild and can be treated at home. More severe cases may need to be treated at a hospital. °· When your condition is under control, your health care provider may recommend that you have an exam called a colonoscopy. This exam can show how severe your diverticula are and whether something else may be causing your symptoms. °This information is not intended to replace advice given to you by your health care provider. Make sure you discuss any questions you have with your health care provider. °Document Released: 02/16/2005 Document Revised: 06/11/2016 Document Reviewed: 06/11/2016 °Elsevier Interactive Patient Education © 2018 [employer] ° °

## 2017-07-29 NOTE — Discharge Summary (Signed)
Physician Discharge Summary  Debbie Young YBO:[id number] DOB: 10-10-1970 DOA: 07/28/2017  PCP: [facility]  Admit date: 07/28/2017 Discharge date: 07/29/2017  Time spent: 35 minutes  Recommendations for Outpatient Follow-up:  1. Repeat basic metabolic panel to follow electrolytes and renal function 2. Repeat CBC to continue following WBCs trend 3. Make sure patient has follow-up with gastroenterology in 6-8 weeks to perform colonoscopy. 4. Reassess for complete resolution of her symptoms.   Discharge Diagnoses:  Diverticulitis of large intestine with microperforation without bleeding Class 2 obesity   Discharge Condition: Stable and improved.  Discharged home with instruction to follow-up with general surgery in 2 weeks and also with her PCP as previously instructed.  Diet recommendation: Low residue and soft diet.  Filed Weights   07/28/17 1354 07/28/17 1939  Weight: 89.4 kg (197 lb) 91.5 kg (201 lb 11.2 oz)    History of present illness:  As per H&P written by Dr. Cathlean Sauer on 07/28/17 47 y.o. female with no significant past medical history.  Patient presents with abdominal pain for the last 4 days, localized on her left side, radiated to the contralateral side, which has been persistent, and worsening, dull in nature, 6/8 out of 10 in intensity, associated with abdominal distention, loss of appetite and fevers.   48 hours after her symptoms started, she was diagnosed with diverticulitis in the outpatient clinic, she was prescribed metronidazole and ciprofloxacin with improvement of fever but the abdominal pain, malaise, nausea and decreased p.o. intake persisted   Hospital Course:  1-diverticulitis of the colon with microperforation and no signs of bleeding: -Patient feeling much better and currently tolerating full liquid diet without any trouble. -Patient will continue oral antibiotics at discharge as previously instructed and will follow up with general surgery  in 2 weeks. -She will eventually required colonoscopy most likely 6-8 weeks after resolution of the acute diverticulitis process. -Patient will be discharged on as needed analgesics and antiemetics I discussed with general surgery service. -Instructed to follow low residue and soft diet and to keep herself well-hydrated.  2-class 2 obesity: -Body mass index is 34.62 kg/m.  -Low calorie diet and exercise has been discussed with patient -She reports to be in the process of losing weight already   Procedures:  See below for x-ray reports  Consultations:  General surgery  Discharge Exam: Vitals:   07/28/17 2056 07/29/17 0555  BP:  106/65  Pulse:  66  Resp:  18  Temp:  97.9 F (36.6 C)  SpO2: 97% 97%    General: Afebrile, no nausea, no vomiting, reports feeling much better and would like to go home.  Still with some discomfort in her left lower quadrant but reports pain medications are helping a lot.  Patient tolerating full liquid diet without any problem prior to discharge. Cardiovascular: S1 and S2, no rubs, no gallops, no JVD. Respiratory: Clear to auscultation bilaterally, no wheezing, no crackles Abdomen: Soft, minimal tenderness in the left lower quadrant on deep palpation, no guarding, positive bowel sounds. Extremities: No edema, no cyanosis or clubbing.  Discharge Instructions   Discharge Instructions    Discharge instructions   Complete by:  As directed    Keep yourself well-hydrated Take medications as prescribed Follow soft diet Arrange follow-up in 2 weeks with Dr. Arnoldo Morale Follow-up with PCP as previously instructed.     Allergies as of 07/29/2017      Reactions   Prednisone Other (See Comments)   Paranoia      Medication  List    TAKE these medications   ciprofloxacin 500 MG tablet Commonly known as:  CIPRO Take 500 mg by mouth 2 (two) times daily. 10 day course starting on 07/26/2017   COLACE 100 MG capsule Generic drug:  docusate sodium Take 2  capsules by mouth daily as needed (for constipation).   HYDROcodone-acetaminophen 5-325 MG tablet Commonly known as:  NORCO/VICODIN Take 1-2 tablets by mouth every 6 (six) hours as needed for severe pain.   metroNIDAZOLE 500 MG tablet Commonly known as:  FLAGYL Take 500 mg by mouth 2 (two) times daily. 10 day course starting on 07/26/2017   ondansetron 8 MG disintegrating tablet Commonly known as:  ZOFRAN ODT Take 1 tablet (8 mg total) by mouth every 8 (eight) hours as needed for nausea or vomiting.   VENTOLIN HFA 108 (90 Base) MCG/ACT inhaler Generic drug:  albuterol Inhale 2 puffs into the lungs every 4 (four) hours as needed for wheezing or shortness of breath.      Allergies  Allergen Reactions  . Prednisone Other (See Comments)    Paranoia   Follow-up Information    Aviva Signs, MD. Schedule an appointment as soon as possible for a visit on 08/10/2017.   Specialty:  General Surgery Contact information: [address] [place] [address] [phone]           The results of significant diagnostics from this hospitalization (including imaging, microbiology, ancillary and laboratory) are listed below for reference.    Significant Diagnostic Studies: Ct Abdomen Pelvis W Contrast  Result Date: 07/28/2017 CLINICAL DATA:  47 year old female with acute abdominal pain for 4 days. EXAM: CT ABDOMEN AND PELVIS WITH CONTRAST TECHNIQUE: Multidetector CT imaging of the abdomen and pelvis was performed using the standard protocol following bolus administration of intravenous contrast. CONTRAST:  163mL ISOVUE-300 IOPAMIDOL (ISOVUE-300) INJECTION 61% COMPARISON:  08/09/2007 CT FINDINGS: Lower chest: No acute abnormality Hepatobiliary: The liver is unremarkable except for stable RIGHT hemangiomas. Patient is status post cholecystectomy. No biliary dilatation. Pancreas: Unremarkable Spleen: Unremarkable Adrenals/Urinary Tract: The kidneys, adrenal glands and bladder are  unremarkable. Stomach/Bowel: Inflammation and mild wall thickening of the distal descending colon noted with a small focus of extraluminal gas (2:59). There is also focal wall thickening with adjacent inflammation of the proximal transverse colon (2: 43-48) without extraluminal gas. Both of these areas are most compatible with acute diverticulitis. There is no evidence of bowel obstruction or abscess. The appendix is normal. Vascular/Lymphatic: No significant vascular findings are present. No enlarged abdominal or pelvic lymph nodes. Reproductive: Uterus and bilateral adnexa are unremarkable except for tubal ligation clips. Other: No ascites. Musculoskeletal: No acute bony abnormality or suspicious bony lesion. IMPRESSION: 1. Focal inflammation adjacent to the distal descending colon (with small focus of extraluminal gas) and adjacent to the proximal transverse colon, most compatible with 2 concurrent areas of acute diverticulitis. No evidence of bowel obstruction or abscess. Correlation with the patient's colon cancer screening history is recommended. If screening is not up-to-date, appropriate screening should be considered when clinically able. Electronically Signed   By: Margarette Canada M.D.   On: 07/28/2017 16:05    Microbiology: Recent Results (from the past 240 hour(s))  Culture, blood (routine x 2)     Status: None (Preliminary result)   Collection Time: 07/28/17  9:02 PM  Result Value Ref Range Status   Specimen Description LEFT ANTECUBITAL  Final   Special Requests   Final    BOTTLES DRAWN AEROBIC ONLY Blood Culture results may  not be optimal due to an excessive volume of blood received in culture bottles   Culture   Final    NO GROWTH < 12 HOURS Performed at [facility], [address] [place], [place] [address]    Report Status PENDING  Incomplete  Culture, blood (routine x 2)     Status: None (Preliminary result)   Collection Time: 07/28/17  9:08 PM  Result Value Ref Range Status    Specimen Description BLOOD LEFT ARM  Final   Special Requests   Final    BOTTLES DRAWN AEROBIC ONLY Blood Culture results may not be optimal due to an excessive volume of blood received in culture bottles   Culture   Final    NO GROWTH < 12 HOURS Performed at [facility], [address], [place], [place] [address]    Report Status PENDING  Incomplete     Labs: Basic Metabolic Panel: Recent Labs  Lab 07/28/17 1428 07/29/17 0358 07/29/17 0445  NA 138 142  --   K 3.6 3.5  --   CL 100* 108  --   CO2 25 25  --   GLUCOSE 99 112*  --   BUN 14 10  --   CREATININE 0.82 0.72  --   CALCIUM 9.1 8.2*  --   MG  --   --  1.9   Liver Function Tests: Recent Labs  Lab 07/28/17 1428 07/29/17 0358  AST 17 15  ALT 21 15  ALKPHOS 74 56  BILITOT 0.8 0.8  PROT 8.0 6.1*  ALBUMIN 3.9 3.1*   Recent Labs  Lab 07/28/17 1428  LIPASE 26   CBC: Recent Labs  Lab 07/28/17 1428 07/29/17 0358  WBC 7.6 4.8  HGB 12.6 10.7*  HCT 40.3 33.5*  MCV 93.9 94.4  PLT 269 224    Signed:  Barton Dubois MD.  [facility] 07/29/2017, 11:37 AM

## 2017-07-31 ENCOUNTER — Ambulatory Visit: Payer: 59 | Admitting: Obstetrics and Gynecology

## 2017-08-03 LAB — CULTURE, BLOOD (ROUTINE X 2)
CULTURE: NO GROWTH
CULTURE: NO GROWTH

## 2017-08-04 ENCOUNTER — Other Ambulatory Visit (HOSPITAL_COMMUNITY)
Admission: RE | Admit: 2017-08-04 | Discharge: 2017-08-04 | Disposition: A | Discharge status: Home / Self Care | Payer: 59 | Source: Ambulatory Visit | Attending: Obstetrics & Gynecology | Admitting: Obstetrics & Gynecology

## 2017-08-04 ENCOUNTER — Ambulatory Visit (INDEPENDENT_AMBULATORY_CARE_PROVIDER_SITE_OTHER): Payer: 59 | Admitting: Women's Health

## 2017-08-04 ENCOUNTER — Encounter: Payer: Self-pay | Admitting: Women's Health

## 2017-08-04 VITALS — BP 102/60 | HR 75 | Ht 64.0 in | Wt 203.0 lb

## 2017-08-04 DIAGNOSIS — Z01419 Encounter for gynecological examination (general) (routine) without abnormal findings: Secondary | ICD-10-CM | POA: Insufficient documentation

## 2017-08-04 DIAGNOSIS — N898 Other specified noninflammatory disorders of vagina: Secondary | ICD-10-CM

## 2017-08-04 DIAGNOSIS — Z01411 Encounter for gynecological examination (general) (routine) with abnormal findings: Secondary | ICD-10-CM | POA: Diagnosis not present

## 2017-08-04 DIAGNOSIS — Z803 Family history of malignant neoplasm of breast: Secondary | ICD-10-CM | POA: Diagnosis not present

## 2017-08-04 LAB — POCT WET PREP (WET MOUNT)
Clue Cells Wet Prep Whiff POC: NEGATIVE
Trichomonas Wet Prep HPF POC: ABSENT

## 2017-08-04 NOTE — Progress Notes (Signed)
WELL-WOMAN EXAMINATION Patient name: Debbie Young MRN [id number]  Date of birth: 1971/04/30 Chief Complaint:   Gynecologic Exam (has been diagnosed with diverticulitis; ? yeast infection; having itching )  History of Present Illness:   Debbie Young is a 47 y.o. [phone] Caucasian female being seen today for a routine well-woman exam.  Current complaints: just recently dx w/ diverticulits, finishing up antibiotics, has started having some vaginal itching, no discharge  PCP: Larene Pickett      does not desire labs Patient's last menstrual period was 07/19/2017. The current method of family planning is tubal ligation Last pap 2016. Results were: normal Last mammogram: 2016. Results were: normal, does have family h/o breast cancer- mom, MGM, maternal aunt. Pt's mom tested neg for BRCA Last colonoscopy: never. Results were: n/a  Review of Systems:   Pertinent items are noted in HPI Denies any headaches, blurred vision, fatigue, shortness of breath, chest pain, abdominal pain, abnormal vaginal discharge/itching/odor/irritation, problems with periods, bowel movements, urination, or intercourse unless otherwise stated above. Pertinent History Reviewed:  Reviewed past medical,surgical, social and family history.  Reviewed problem list, medications and allergies. Physical Assessment:   Vitals:   08/04/17 1114  BP: 102/60  Pulse: 75  Weight: 203 lb (92.1 kg)  Height: _0  (1.626 m)  Body mass index is 34.84 kg/m.        Physical Examination:   General appearance - well appearing, and in no distress  Mental status - alert, oriented to person, place, and time  Psych:  She has a normal mood and affect  Skin - warm and dry, normal color, no suspicious lesions noted  Chest - effort normal, all lung fields clear to auscultation bilaterally  Heart - normal rate and regular rhythm  Neck:  midline trachea, no thyromegaly or nodules  Breasts - breasts appear normal, no suspicious masses, no skin  or nipple changes or  axillary nodes  Abdomen - soft, nontender, nondistended, no masses or organomegaly  Pelvic - VULVA: normal appearing vulva with no masses, tenderness or lesions  VAGINA: normal appearing vagina with normal color and discharge, no lesions  CERVIX: normal appearing cervix without discharge or lesions, no CMT  Thin prep pap is done w/ HR HPV cotesting  UTERUS: uterus is felt to be normal size, shape, consistency and nontender   ADNEXA: No adnexal masses or tenderness noted.  Extremities:  No swelling or varicosities noted  Results for orders placed or performed in visit on 08/04/17 (from the past 24 hour(s))  POCT Wet Prep [facility])   Collection Time: 08/04/17 12:21 PM  Result Value Ref Range   Source Wet Prep POC vaginal    WBC, Wet Prep HPF POC none    Bacteria Wet Prep HPF POC Few Few   BACTERIA WET PREP MORPHOLOGY POC     Clue Cells Wet Prep HPF POC None None   Clue Cells Wet Prep Whiff POC Negative Whiff    Yeast Wet Prep HPF POC None    KOH Wet Prep POC     Trichomonas Wet Prep HPF POC Absent Absent    Assessment & Plan:  1) Well-Woman Exam  2) Recent dx diverticulitis> f/u w/ Dr. Arnoldo Morale as scheduled  3) Family h/o breast cancer> Mom (neg BRCA), MGM, maternal aunt. Pt past due for mammogram, to schedule asap  4) H/O abnormal pap w/ LEEP  5) Vaginal itching> wet prep neg today, will see what pap shows. Offered diflucan, pt wants to wait on  pap  Labs/procedures today: pap  Mammogram- call to schedule screening mammo asap  Colonoscopy per Dr. Arnoldo Morale  Orders Placed This Encounter  Procedures  . POCT Wet Prep [facility])    Follow-up: Return in about 1 year (around 08/05/2018) for Physical.  [place] 08/04/2017 12:24 PM

## 2017-08-04 NOTE — Patient Instructions (Signed)
Call to schedule your mammogram.

## 2017-08-08 LAB — CYTOLOGY - PAP
Chlamydia: NEGATIVE
DIAGNOSIS: NEGATIVE
HPV (WINDOPATH): NOT DETECTED
NEISSERIA GONORRHEA: NEGATIVE

## 2017-08-09 DIAGNOSIS — K5732 Diverticulitis of large intestine without perforation or abscess without bleeding: Secondary | ICD-10-CM | POA: Diagnosis not present

## 2017-10-11 DIAGNOSIS — R933 Abnormal findings on diagnostic imaging of other parts of digestive tract: Secondary | ICD-10-CM | POA: Diagnosis not present

## 2017-10-11 DIAGNOSIS — Z8719 Personal history of other diseases of the digestive system: Secondary | ICD-10-CM | POA: Diagnosis not present

## 2017-11-10 DIAGNOSIS — K648 Other hemorrhoids: Secondary | ICD-10-CM | POA: Diagnosis not present

## 2017-11-10 DIAGNOSIS — K5730 Diverticulosis of large intestine without perforation or abscess without bleeding: Secondary | ICD-10-CM | POA: Diagnosis not present

## 2017-11-10 DIAGNOSIS — K5732 Diverticulitis of large intestine without perforation or abscess without bleeding: Secondary | ICD-10-CM | POA: Diagnosis not present

## 2017-11-10 DIAGNOSIS — K640 First degree hemorrhoids: Secondary | ICD-10-CM | POA: Diagnosis not present

## 2017-11-10 DIAGNOSIS — K635 Polyp of colon: Secondary | ICD-10-CM | POA: Diagnosis not present

## 2017-11-10 DIAGNOSIS — D126 Benign neoplasm of colon, unspecified: Secondary | ICD-10-CM | POA: Diagnosis not present

## 2018-02-06 DIAGNOSIS — J069 Acute upper respiratory infection, unspecified: Secondary | ICD-10-CM | POA: Diagnosis not present

## 2018-03-21 ENCOUNTER — Ambulatory Visit (INDEPENDENT_AMBULATORY_CARE_PROVIDER_SITE_OTHER): Payer: 59

## 2018-03-21 ENCOUNTER — Encounter: Payer: Self-pay | Admitting: Orthopedic Surgery

## 2018-03-21 ENCOUNTER — Ambulatory Visit: Payer: 59 | Admitting: Orthopedic Surgery

## 2018-03-21 VITALS — BP 131/79 | HR 74 | Ht 64.03 in | Wt 193.0 lb

## 2018-03-21 DIAGNOSIS — M23321 Other meniscus derangements, posterior horn of medial meniscus, right knee: Secondary | ICD-10-CM | POA: Diagnosis not present

## 2018-03-21 DIAGNOSIS — G8929 Other chronic pain: Secondary | ICD-10-CM | POA: Diagnosis not present

## 2018-03-21 DIAGNOSIS — M25561 Pain in right knee: Secondary | ICD-10-CM

## 2018-03-21 NOTE — Addendum Note (Signed)
Addended byCandice Camp on: 03/21/2018 02:33 PM   Modules accepted: Orders

## 2018-03-21 NOTE — Patient Instructions (Signed)
Knee Arthroscopy Knee arthroscopy is a surgical procedure that is used to examine the inside of your knee joint and repair any damage. The surgeon puts a small, lighted instrument with a camera on the tip (arthroscope) through a small incision in your knee. The camera sends pictures to a monitor in the operating room. Your surgeon uses those pictures to guide the surgical instruments through other incisions to the area of damage. Knee arthroscopy can be used to treat many types of knee problems. It may be used:  To repair a torn ligament.  To repair or remove damaged tissue.  To remove a fluid-filled sac (cyst) from your knee.  Tell a health care provider about:  Any allergies you have.  All medicines you are taking, including vitamins, herbs, eye drops, creams, and over-the-counter medicines.  Any problems you or family members have had with anesthetic medicines.  Any blood disorders you have.  Any surgeries you have had.  Any medical conditions you have. What are the risks? Generally, this is a safe procedure. However, problems may occur, including:  Infection.  Bleeding.  Damage to blood vessels, nerves, or structures of your knee.  A blood clot that forms in your leg and travels to your lung.  Failure to relieve symptoms.  What happens before the procedure?  Ask your health care provider about: ? Changing or stopping your regular medicines. This is especially important if you are taking diabetes medicines or blood thinners. ? Taking medicines such as aspirin and ibuprofen. These medicines can thin your blood. Do not take these medicines before your procedure if your health care provider instructs you not to.  Follow your health care provider's instructions about eating or drinking restrictions.  Plan to have someone take you home after the procedure.  If you go home right after the procedure, plan to have someone with you for 24 hours.  Do not drink alcohol unless  your health care provider says that you can.  Do not use any tobacco products, including cigarettes, chewing tobacco, or electronic cigarettes unless your health care provider says that you can. If you need help quitting, ask your health care provider.  You may have a physical exam. What happens during the procedure?  An IV tube will be inserted into one of your veins.  You will be given one or more of the following: ? A medicine that helps you relax (sedative). ? A medicine that numbs the area (local anesthetic). ? A medicine that makes you fall asleep (general anesthetic). ? A medicine that is injected into your spine that numbs the area below and slightly above the injection site (spinal anesthetic). ? A medicine that is injected into an area of your body that numbs everything below the injection site (regional anesthetic).  A cuff may be placed around your upper leg to slow bleeding during the procedure.  The surgeon will make a small number of incisions around your knee.  Your knee joint will be flushed and filled with a germ-free (sterile) solution.  The arthroscope will be passed through an incision into your knee joint.  More instruments will be passed through other incisions to repair your knee as needed.  The fluid will be removed from your knee.  The incisions will be closed with adhesive strips or stitches (sutures).  A bandage (dressing) will be placed over your knee. The procedure may vary among health care providers and hospitals. What happens after the procedure?  Your blood pressure, heart rate, breathing  rate and blood oxygen level will be monitored often until the medicines you were given have worn off.  You may be given medicine for pain.  You may get crutches to help you walk without using your knee to support your body weight.  You may have to wear compression stockings. These stocking help to prevent blood clots and reduce swelling in your legs. This  information is not intended to replace advice given to you by your health care provider. Make sure you discuss any questions you have with your health care provider. Document Released: 05/06/2000 Document Revised: 10/15/2015 Document Reviewed: 05/05/2014 Elsevier Interactive Patient Education  [employer].

## 2018-03-21 NOTE — Progress Notes (Signed)
Debbie Young  03/21/2018  HISTORY SECTION :  Chief Complaint  Patient presents with  . Knee Pain    right knee pain decreased ROM has pulling in knee   HPI The patient presents for evaluation of painful right knee medial and peripatellar for 2 months no trauma  Location RIGHT KNEE PAIN  Duration 2 MONTHS  Quality DULL ACHE  Severity MOD  Associated with ability to fully extend.  Cannot flex the knee enough to get into her bed.   Review of Systems  Musculoskeletal: Positive for back pain.  Neurological:       Right lateral leg pain dermatome L5  All other systems reviewed and are negative.   Past Medical History:  Diagnosis Date  . Anxiety   . Cellulitis 04/2001   LEFT FOOT  . Diverticulitis   . Hematuria    BENIGN--SAW UROLOGIST  . History of abnormal cervical Pap smear 04/30/2015  . LGSIL (low grade squamous intraepithelial dysplasia) 1997  . Miscarriage 08/02/2013  . Patient desires pregnancy 11/18/2013  . Seizure disorder ([place])    AS A CHILD--NO MEDS SINCE 31 YO  . Smoker   . Vaginal Pap smear, abnormal     Past Surgical History:  Procedure Laterality Date  . [place]  . CERVICAL BIOPSY  W/ LOOP ELECTRODE EXCISION  07/2002  . CESAREAN SECTION    . CESAREAN SECTION WITH BILATERAL TUBAL LIGATION Bilateral 03/09/2017   Procedure: REPEAT CESAREAN SECTION WITH BILATERAL TUBAL LIGATION;  Surgeon: Jonnie Kind, MD;  Location: [facility];  Service: Obstetrics;  Laterality: Bilateral;  . CHOLECYSTECTOMY    . COLPOSCOPY    . REFRACTIVE SURGERY  2000  . TONSILLECTOMY AND ADENOIDECTOMY      Social History   Tobacco Use  . Smoking status: Current Every Day Smoker    Packs/day: 0.50    Years: 20.00    Pack years: 10.00    Types: Cigarettes  . Smokeless tobacco: Never Used  Substance Use Topics  . Alcohol use: Yes    Comment: social drink  . Drug use: No    Family History  Problem Relation Age of Onset  . Breast cancer Mother  13  . Fibromyalgia Mother   . Von Willebrand disease Mother   . Breast cancer Maternal Aunt   . Breast cancer Maternal Grandmother   . Heart disease Maternal Grandmother   . Anuerysm Maternal Grandmother   . Heart failure Maternal Grandmother   . Hypertension Maternal Grandfather   . Stroke Maternal Grandfather   . Heart disease Paternal Grandmother   . Heart disease Paternal Grandfather   . Cancer Father        Myloma and [place] water   . Asthma Son       Allergies  Allergen Reactions  . Prednisone Other (See Comments)    Paranoia     Current Outpatient Medications:  Marland Kitchen  VENTOLIN HFA 108 (90 BASE) MCG/ACT inhaler, Inhale 2 puffs into the lungs every 4 (four) hours as needed for wheezing or shortness of breath. , Disp: , Rfl:    PHYSICAL EXAM SECTION: BP 131/79   Pulse 74   Ht 5' 4.03" (1.626 m)   Wt 193 lb (87.5 kg)   BMI 33.10 kg/m   Body mass index is 33.1 kg/m. General appearance: Well-developed well-nourished no gross deformities  Eyes clear normal vision no evidence of conjunctivitis or jaundice, extraocular muscles intact  ENT: ears hearing normal, nasal  passages clear, throat clear   Lymph nodes: No lymphadenopathy  Neck is supple without palpable mass, full range of motion  Cardiovascular normal pulse and perfusion in all 4 extremities normal color without edema  Neurologically deep tendon reflexes are equal and normal, no sensation loss or deficits no pathologic reflexes  Psychological: Awake alert and oriented x3 mood and affect normal  Skin no lacerations or ulcerations no nodularity no palpable masses, no erythema or nodularity  Musculoskeletal:  Right Knee Exam   Muscle Strength  The patient has normal right knee strength.  Tenderness  The patient is experiencing tenderness in the medial joint line and patella (Her patellar tenderness and crepitance).  Range of Motion  Extension:  5 normal  Flexion:  120 normal    Tests  McMurray:  Medial - positive Lateral - negative Varus: negative Valgus: negative Drawer:  Anterior - negative    Posterior - negative  Other  Erythema: absent Scars: absent Sensation: normal Pulse: present Swelling: none Effusion: effusion present   Left Knee Exam   Muscle Strength  The patient has normal left knee strength.  Tenderness  The patient is experiencing no tenderness.   Range of Motion  Extension: normal  Flexion: normal   Tests  McMurray:  Medial - negative Lateral - negative Varus: negative Valgus: negative Drawer:  Anterior - negative     Posterior - negative  Other  Erythema: absent Scars: absent Sensation: normal Pulse: present Swelling: none Effusion: no effusion present       MEDICAL DECISION SECTION:  Encounter Diagnoses  Name Primary?  . Chronic pain of right knee Yes  . Derangement of posterior horn of medial meniscus of right knee     Imaging XRAYS OFFICE I see may be a hint of arthritis in the lateral compartment alignment otherwise normal no effusion no bone spurs no secondary bone changes  REC MRI KNEE WONT EXTEND  MED IAL MENISCUS TEAR ACUTE     Plan:  (Rx., Inj., surg., Frx, MRI/CT, XR:2)  MRI KNEE RIGHT   2:11 PM

## 2018-03-22 ENCOUNTER — Telehealth: Payer: Self-pay | Admitting: Radiology

## 2018-03-22 NOTE — Telephone Encounter (Signed)
1pm on Nov 6th arrive at 1230 for MRI scan I called her but this day is not good, she will call to reschedule

## 2018-03-28 ENCOUNTER — Ambulatory Visit (HOSPITAL_COMMUNITY): Payer: 59

## 2018-03-30 ENCOUNTER — Ambulatory Visit (HOSPITAL_COMMUNITY)
Admission: RE | Admit: 2018-03-30 | Discharge: 2018-03-30 | Disposition: A | Discharge status: Home / Self Care | Payer: 59 | Source: Ambulatory Visit | Attending: Orthopedic Surgery | Admitting: Orthopedic Surgery

## 2018-03-30 ENCOUNTER — Other Ambulatory Visit: Payer: Self-pay | Admitting: Obstetrics and Gynecology

## 2018-03-30 DIAGNOSIS — M67461 Ganglion, right knee: Secondary | ICD-10-CM | POA: Diagnosis not present

## 2018-03-30 DIAGNOSIS — M25561 Pain in right knee: Secondary | ICD-10-CM | POA: Diagnosis not present

## 2018-03-30 DIAGNOSIS — G8929 Other chronic pain: Secondary | ICD-10-CM | POA: Insufficient documentation

## 2018-03-30 DIAGNOSIS — Z1231 Encounter for screening mammogram for malignant neoplasm of breast: Secondary | ICD-10-CM

## 2018-03-30 DIAGNOSIS — M23321 Other meniscus derangements, posterior horn of medial meniscus, right knee: Secondary | ICD-10-CM | POA: Insufficient documentation

## 2018-04-04 ENCOUNTER — Encounter: Payer: Self-pay | Admitting: Orthopedic Surgery

## 2018-04-04 ENCOUNTER — Ambulatory Visit (INDEPENDENT_AMBULATORY_CARE_PROVIDER_SITE_OTHER): Payer: 59 | Admitting: Orthopedic Surgery

## 2018-04-04 VITALS — BP 138/88 | HR 71 | Ht 64.03 in | Wt 193.0 lb

## 2018-04-04 DIAGNOSIS — M2341 Loose body in knee, right knee: Secondary | ICD-10-CM | POA: Diagnosis not present

## 2018-04-04 NOTE — Progress Notes (Signed)
Progress Note   Patient ID: Debbie Young, female   DOB: 1971/05/11, 47 y.o.   MRN: [id number]   Chief Complaint  Patient presents with  . Results    review MRI right knee    HPI 47 year old female presented to [place] with complaints of trouble kneeling on her left knee painful bending of the left knee she had an MRI and it showed that she has a ganglion cyst in her ACL.  I looked at this and she does indeed have a small cyst there.  I imagine that it could cause some discomfort kneeling.  However, she does not have any meniscal tears or ligament pathology.  We should note that there was some thickening and increased signal in the anterior cruciate ligament which is consistent with mucoid degeneration  She is aware that if she does have surgery which was presented as an option she may or may not get better  However, she does not want to leave things as they are   Location RIGHT KNEE PAIN  Duration 2 MONTHS  Quality DULL ACHE  Severity MOD  Associated with ability to fully extend.  Cannot flex the knee enough to get into her bed.  Review of systems unchanged from March 21, 2018 Review of Systems  Musculoskeletal: Positive for back pain.  Neurological:       Right lateral leg pain dermatome L5  All other systems reviewed and are negative.  Current Meds  Medication Sig  . VENTOLIN HFA 108 (90 BASE) MCG/ACT inhaler Inhale 2 puffs into the lungs every 4 (four) hours as needed for wheezing or shortness of breath.     Past Medical History:  Diagnosis Date  . Anxiety   . Cellulitis 04/2001   LEFT FOOT  . Diverticulitis   . Hematuria    BENIGN--SAW UROLOGIST  . History of abnormal cervical Pap smear 04/30/2015  . LGSIL (low grade squamous intraepithelial dysplasia) 1997  . Miscarriage 08/02/2013  . Patient desires pregnancy 11/18/2013  . Seizure disorder ([place])    AS A CHILD--NO MEDS SINCE 28 YO  . Smoker   . Vaginal Pap smear, abnormal      Allergies  Allergen Reactions  .  Prednisone Other (See Comments)    Paranoia     BP 138/88   Pulse 71   Ht 5' 4.03" (1.626 m)   Wt 193 lb (87.5 kg)   BMI 33.10 kg/m    Physical Exam  Ortho Exam previous exam is unchanged PHYSICAL EXAM SECTION: General appearance: Well-developed well-nourished no gross deformities   Eyes clear normal vision no evidence of conjunctivitis or jaundice, extraocular muscles intact   ENT: ears hearing normal, nasal passages clear, throat clear   Lymph nodes: No lymphadenopathy   Neck is supple without palpable mass, full range of motion  Cardiovascular normal pulse and perfusion in all 4 extremities normal color without edema  Neurologically deep tendon reflexes are equal and normal, no sensation loss or deficits no pathologic reflexes   Psychological: Awake alert and oriented x3 mood and affect normal   Skin no lacerations or ulcerations no nodularity no palpable masses, no erythema or nodularity   Musculoskeletal:   Right Knee Exam    Muscle Strength  The patient has normal right knee strength.   Tenderness  The patient is experiencing tenderness in the medial joint line and patella (Her patellar tenderness and crepitance).   Range of Motion  Extension:  5 normal  Flexion:  120 normal  Tests  McMurray:  Medial - positive Lateral - negative Varus: negative Valgus: negative Drawer:  Anterior - negative    Posterior - negative   Other  Erythema: absent Scars: absent Sensation: normal Pulse: present Swelling: none Effusion: effusion present     Left Knee Exam    Muscle Strength  The patient has normal left knee strength.   Tenderness  The patient is experiencing no tenderness.    Range of Motion  Extension: normal  Flexion: normal    Tests  McMurray:  Medial - negative Lateral - negative Varus: negative Valgus: negative Drawer:  Anterior - negative     Posterior - negative   Other  Erythema: absent Scars: absent Sensation: normal Pulse:  present Swelling: none Effusion: no effusion present             MEDICAL DECISION MAKING   Imaging:  COMPARISON:  Plain films right knee 03/21/2018.   FINDINGS: MENISCI   Medial meniscus:  Intact.   Lateral meniscus:  Intact.   LIGAMENTS   Cruciates: Intact. There is thickening and intrasubstance increased T2 signal in the anterior cruciate ligament consistent with mucoid degeneration. An ACL ganglion cyst anterior to the tibial attachment of the ligament measures 1.4 cm transverse by 0.9 cm craniocaudal by 1.1 cm AP.   Collaterals:  Intact.   CARTILAGE   Patellofemoral:  Normal.   Medial:  Normal.   Lateral:  Normal.   Joint:  Small effusion.   Popliteal Fossa:  No Baker's cyst.   Extensor Mechanism:  Intact.   Bones:  Normal marrow signal throughout.   Other: None.   IMPRESSION: Mucoid degeneration of the ACL with an associated ACL ganglion anterior to the tibial attachment. The study is otherwise normal. Negative for meniscal or ligament tear.     Electronically Signed   By: Inge Rise M.D.   On: 03/30/2018 13:01    Encounter Diagnosis  Name Primary?  . Loose, body, joint, knee, right Yes   Ganglion cyst is in the ACL area.  It is reported as loose body for lack of other I CD10 diagnostic code  PLAN: (RX., injection, surgery,frx,mri/ct, XR 2 body ares) Arthroscopy and removal of ganglion cyst right knee  No orders of the defined types were placed in this encounter.  5:54 PM 04/04/2018

## 2018-04-04 NOTE — Patient Instructions (Signed)
Meniscus Injury, Arthroscopy   Arthroscopy is a surgical procedure that involves the use of a small scope that has a camera and surgical instruments on the end (arthroscope). An arthroscope can be used to repair your meniscus injury.  LET YOUR HEALTH CARE PROVIDER KNOW ABOUT:  Any allergies you have.  All medicines you are taking, including vitamins, herbs, eyedrops, creams, and over-the-counter medicines.  Any recent colds or infections you have had or currently have.  Previous problems you or members of your family have had with the use of anesthetics.  Any blood disorders or blood clotting problems you have.  Previous surgeries you have had.  Medical conditions you have. RISKS AND COMPLICATIONS Generally, this is a safe procedure. However, as with any procedure, problems can occur. Possible problems include:  Damage to nerves or blood vessels.  Excess bleeding.  Blood clots.  Infection. BEFORE THE PROCEDURE  Do not eat or drink for 6-8 hours before the procedure.  Take medicines as directed by your surgeon. Ask your surgeon about changing or stopping your regular medicines.  You may have lab tests the morning of surgery. PROCEDURE  You will be given one of the following:   A medicine that numbs the area (local anesthesia).  A medicine that makes you go to sleep (general anesthesia).  A medicine injected into your spine that numbs your body below the waist (spinal anesthesia). Most often, several small cuts (incisions) are made in the knee. The arthroscope and instruments go into the incisions to repair the damage. The torn portion of the meniscus is removed.   AFTER THE PROCEDURE  You will be taken to the recovery area where your progress will be monitored. When you are awake, stable, and taking fluids without complications, you will be allowed to go home. This is usually the same day. A torn or stretched ligament (ligament sprain) may take 6-8 weeks to heal.   It  takes about the 4-6 WEEKS if your surgeon removed a torn meniscus.  A repaired meniscus may require 6-12 weeks of recovery time.  A torn ligament needing reconstructive surgery may take 6-12 months to heal fully.   This information is not intended to replace advice given to you by your health care provider. Make sure you discuss any questions you have with your health care provider. You have decided to proceed with operative arthroscopy of the knee. You have decided not to continue with nonoperative measures such as but not limited to oral medication, weight loss, activity modification, physical therapy, bracing, or injection.  We will perform operative arthroscopy of the knee. Some of the risks associated with arthroscopic surgery of the knee include but are not limited to Bleeding Infection Swelling Stiffness Blood clot Pain Need for knee replacement surgery    In compliance with recent [place] law in federal regulation regarding opioid use and abuse and addiction, we will taper (stop) opioid medication after 2 weeks.  If you're not comfortable with these risks and would like to continue with nonoperative treatment please let Dr. Demitrus Francisco know prior to your surgery. 

## 2018-04-10 NOTE — Patient Instructions (Signed)
Debbie Young  04/10/2018     @PREFPERIOPPHARMACY @   Your procedure is scheduled on  04/26/2018 .  Report to [facility] at  900   A.M.  Call this number if you have problems the morning of surgery:  [phone]   Remember:  Do not eat or drink after midnight.                         Take these medicines the morning of surgery with A SIP OF WATER  None. Use your inhaler before you come.    Do not wear jewelry, make-up or nail polish.  Do not wear lotions, powders, or perfumes, or deodorant.  Do not shave 48 hours prior to surgery.  Men may shave face and neck.  Do not bring valuables to the hospital.  [facility] is not responsible for any belongings or valuables.  Contacts, dentures or bridgework may not be worn into surgery.  Leave your suitcase in the car.  After surgery it may be brought to your room.  For patients admitted to the hospital, discharge time will be determined by your treatment team.  Patients discharged the day of surgery will not be allowed to drive home.   Name and phone number of your driver:   family Special instructions:  None  Please read over the following fact sheets that you were given. Anesthesia Post-op Instructions and Care and Recovery After Surgery       Ganglion Cyst A ganglion cyst is a noncancerous, fluid-filled lump that occurs near joints or tendons. The ganglion cyst grows out of a joint or the lining of a tendon. It most often develops in the hand or wrist, but it can also develop in the shoulder, elbow, hip, knee, ankle, or foot. The round or oval ganglion cyst can be the size of a pea or larger than a grape. Increased activity may enlarge the size of the cyst because more fluid starts to build up. What are the causes? It is not known what causes a ganglion cyst to grow. However, it may be related to:  Inflammation or irritation around the joint.  An injury.  Repetitive movements or  overuse.  Arthritis.  What increases the risk? Risk factors include:  Being a woman.  Being age 22-50.  What are the signs or symptoms? Symptoms may include:  A lump. This most often appears on the hand or wrist, but it can occur in other areas of the body.  Tingling.  Pain.  Numbness.  Muscle weakness.  Weak grip.  Less movement in a joint.  How is this diagnosed? Ganglion cysts are most often diagnosed based on a physical exam. Your health care provider will feel the lump and may shine a light alongside it. If it is a ganglion cyst, a light often shines through it. Your health care provider may order an X-ray, ultrasound, or MRI to rule out other conditions. How is this treated? Ganglion cysts usually go away on their own without treatment. If pain or other symptoms are involved, treatment may be needed. Treatment is also needed if the ganglion cyst limits your movement or if it gets infected. Treatment may include:  Wearing a brace or splint on your wrist or finger.  Taking anti-inflammatory medicine.  Draining fluid from the lump with a needle (aspiration).  Injecting a steroid into the joint.  Surgery to remove  the ganglion cyst.  Follow these instructions at home:  Do not press on the ganglion cyst, poke it with a needle, or hit it.  Take medicines only as directed by your health care provider.  Wear your brace or splint as directed by your health care provider.  Watch your ganglion cyst for any changes.  Keep all follow-up visits as directed by your health care provider. This is important. Contact a health care provider if:  Your ganglion cyst becomes larger or more painful.  You have increased redness, red streaks, or swelling.  You have pus coming from the lump.  You have weakness or numbness in the affected area.  You have a fever or chills. This information is not intended to replace advice given to you by your health care provider. Make  sure you discuss any questions you have with your health care provider. Document Released: 05/06/2000 Document Revised: 10/15/2015 Document Reviewed: 10/22/2013 Elsevier Interactive Patient Education  2018 [place]. Knee Arthroscopy Knee arthroscopy is a surgical procedure that is used to examine the inside of your knee joint and repair any damage. The surgeon puts a small, lighted instrument with a camera on the tip (arthroscope) through a small incision in your knee. The camera sends pictures to a monitor in the operating room. Your surgeon uses those pictures to guide the surgical instruments through other incisions to the area of damage. Knee arthroscopy can be used to treat many types of knee problems. It may be used:  To repair a torn ligament.  To repair or remove damaged tissue.  To remove a fluid-filled sac (cyst) from your knee.  Tell a health care provider about:  Any allergies you have.  All medicines you are taking, including vitamins, herbs, eye drops, creams, and over-the-counter medicines.  Any problems you or family members have had with anesthetic medicines.  Any blood disorders you have.  Any surgeries you have had.  Any medical conditions you have. What are the risks? Generally, this is a safe procedure. However, problems may occur, including:  Infection.  Bleeding.  Damage to blood vessels, nerves, or structures of your knee.  A blood clot that forms in your leg and travels to your lung.  Failure to relieve symptoms.  What happens before the procedure?  Ask your health care provider about: ? Changing or stopping your regular medicines. This is especially important if you are taking diabetes medicines or blood thinners. ? Taking medicines such as aspirin and ibuprofen. These medicines can thin your blood. Do not take these medicines before your procedure if your health care provider instructs you not to.  Follow your health care provider's  instructions about eating or drinking restrictions.  Plan to have someone take you home after the procedure.  If you go home right after the procedure, plan to have someone with you for 24 hours.  Do not drink alcohol unless your health care provider says that you can.  Do not use any tobacco products, including cigarettes, chewing tobacco, or electronic cigarettes unless your health care provider says that you can. If you need help quitting, ask your health care provider.  You may have a physical exam. What happens during the procedure?  An IV tube will be inserted into one of your veins.  You will be given one or more of the following: ? A medicine that helps you relax (sedative). ? A medicine that numbs the area (local anesthetic). ? A medicine that makes you fall asleep (general  anesthetic). ? A medicine that is injected into your spine that numbs the area below and slightly above the injection site (spinal anesthetic). ? A medicine that is injected into an area of your body that numbs everything below the injection site (regional anesthetic).  A cuff may be placed around your upper leg to slow bleeding during the procedure.  The surgeon will make a small number of incisions around your knee.  Your knee joint will be flushed and filled with a germ-free (sterile) solution.  The arthroscope will be passed through an incision into your knee joint.  More instruments will be passed through other incisions to repair your knee as needed.  The fluid will be removed from your knee.  The incisions will be closed with adhesive strips or stitches (sutures).  A bandage (dressing) will be placed over your knee. The procedure may vary among health care providers and hospitals. What happens after the procedure?  Your blood pressure, heart rate, breathing rate and blood oxygen level will be monitored often until the medicines you were given have worn off.  You may be given medicine for  pain.  You may get crutches to help you walk without using your knee to support your body weight.  You may have to wear compression stockings. These stocking help to prevent blood clots and reduce swelling in your legs. This information is not intended to replace advice given to you by your health care provider. Make sure you discuss any questions you have with your health care provider. Document Released: 05/06/2000 Document Revised: 10/15/2015 Document Reviewed: 05/05/2014 Elsevier Interactive Patient Education  2018 [place]. Knee Arthroscopy, Care After Refer to this sheet in the next few weeks. These instructions provide you with information about caring for yourself after your procedure. Your health care provider may also give you more specific instructions. Your treatment has been planned according to current medical practices, but problems sometimes occur. Call your health care provider if you have any problems or questions after your procedure. What can I expect after the procedure? After the procedure, it is common to have:  Soreness.  Pain.  Follow these instructions at home: Bathing  Do not take baths, swim, or use a hot tub until your health care provider approves. Incision care  There are many different ways to close and cover an incision, including stitches, skin glue, and adhesive strips. Follow your health care provider's instructions about: ? Incision care. ? Bandage (dressing) changes and removal. ? Incision closure removal.  Check your incision area every day for signs of infection. Watch for: ? Redness, swelling, or pain. ? Fluid, blood, or pus. Activity  Avoid strenuous activities for as long as directed by your health care provider.  Return to your normal activities as directed by your health care provider. Ask your health care provider what activities are safe for you.  Perform range-of-motion exercises only as directed by your health care  provider.  Do not lift anything that is heavier than 10 lb (4.5 kg).  Do not drive or operate heavy machinery while taking pain medicine.  If you were given crutches, use them as directed by your health care provider. Managing pain, stiffness, and swelling  If directed, apply ice to the injured area: ? Put ice in a plastic bag. ? Place a towel between your skin and the bag. ? Leave the ice on for 20 minutes, 2-3 times per day.  Raise the injured area above the level of your heart while  you are sitting or lying down as directed by your health care provider. General instructions  Keep all follow-up visits as directed by your health care provider. This is important.  Take medicines only as directed by your health care provider.  Do not use any tobacco products, including cigarettes, chewing tobacco, or electronic cigarettes. If you need help quitting, ask your health care provider.  If you were given compression stockings, wear them as directed by your health care provider. These stockings help prevent blood clots and reduce swelling in your legs. Contact a health care provider if:  You have severe pain with any movement of your knee.  You notice a bad smell coming from the incision or dressing.  You have redness, swelling, or pain at the site of your incision.  You have fluid, blood, or pus coming from your incision. Get help right away if:  You develop a rash.  You have a fever.  You have difficulty breathing or have shortness of breath.  You develop pain in your calves or in the back of your knee.  You develop chest pain.  You develop numbness or tingling in your leg or foot. This information is not intended to replace advice given to you by your health care provider. Make sure you discuss any questions you have with your health care provider. Document Released: 11/26/2004 Document Revised: 10/09/2015 Document Reviewed: 05/05/2014 Elsevier Interactive Patient Education   2018 [place] Anesthesia, Adult General anesthesia is the use of medicines to make a person "go to sleep" (be unconscious) for a medical procedure. General anesthesia is often recommended when a procedure:  Is long.  Requires you to be still or in an unusual position.  Is major and can cause you to lose blood.  Is impossible to do without general anesthesia.  The medicines used for general anesthesia are called general anesthetics. In addition to making you sleep, the medicines:  Prevent pain.  Control your blood pressure.  Relax your muscles.  Tell a health care provider about:  Any allergies you have.  All medicines you are taking, including vitamins, herbs, eye drops, creams, and over-the-counter medicines.  Any problems you or family members have had with anesthetic medicines.  Types of anesthetics you have had in the past.  Any bleeding disorders you have.  Any surgeries you have had.  Any medical conditions you have.  Any history of heart or lung conditions, such as heart failure, sleep apnea, or chronic obstructive pulmonary disease (COPD).  Whether you are pregnant or may be pregnant.  Whether you use tobacco, alcohol, marijuana, or street drugs.  Any history of Armed forces logistics/support/administrative officer.  Any history of depression or anxiety. What are the risks? Generally, this is a safe procedure. However, problems may occur, including:  Allergic reaction to anesthetics.  Lung and heart problems.  Inhaling food or liquids from your stomach into your lungs (aspiration).  Injury to nerves.  Waking up during your procedure and being unable to move (rare).  Extreme agitation or a state of mental confusion (delirium) when you wake up from the anesthetic.  Air in the bloodstream, which can lead to stroke.  These problems are more likely to develop if you are having a major surgery or if you have an advanced medical condition. You can prevent some of these  complications by answering all of your health care provider's questions thoroughly and by following all pre-procedure instructions. General anesthesia can cause side effects, including:  Nausea or vomiting  A sore throat from the breathing tube.  Feeling cold or shivery.  Feeling tired, washed out, or achy.  Sleepiness or drowsiness.  Confusion or agitation.  What happens before the procedure? Staying hydrated Follow instructions from your health care provider about hydration, which may include:  Up to 2 hours before the procedure - you may continue to drink clear liquids, such as water, clear fruit juice, black coffee, and plain tea.  Eating and drinking restrictions Follow instructions from your health care provider about eating and drinking, which may include:  8 hours before the procedure - stop eating heavy meals or foods such as meat, fried foods, or fatty foods.  6 hours before the procedure - stop eating light meals or foods, such as toast or cereal.  6 hours before the procedure - stop drinking milk or drinks that contain milk.  2 hours before the procedure - stop drinking clear liquids.  Medicines  Ask your health care provider about: ? Changing or stopping your regular medicines. This is especially important if you are taking diabetes medicines or blood thinners. ? Taking medicines such as aspirin and ibuprofen. These medicines can thin your blood. Do not take these medicines before your procedure if your health care provider instructs you not to. ? Taking new dietary supplements or medicines. Do not take these during the week before your procedure unless your health care provider approves them.  If you are told to take a medicine or to continue taking a medicine on the day of the procedure, take the medicine with sips of water. General instructions   Ask if you will be going home the same day, the following day, or after a longer hospital stay. ? Plan to have  someone take you home. ? Plan to have someone stay with you for the first 24 hours after you leave the hospital or clinic.  For 3-6 weeks before the procedure, try not to use any tobacco products, such as cigarettes, chewing tobacco, and e-cigarettes.  You may brush your teeth on the morning of the procedure, but make sure to spit out the toothpaste. What happens during the procedure?  You will be given anesthetics through a mask and through an IV tube in one of your veins.  You may receive medicine to help you relax (sedative).  As soon as you are asleep, a breathing tube may be used to help you breathe.  An anesthesia specialist will stay with you throughout the procedure. He or she will help keep you comfortable and safe by continuing to give you medicines and adjusting the amount of medicine that you get. He or she will also watch your blood pressure, pulse, and oxygen levels to make sure that the anesthetics do not cause any problems.  If a breathing tube was used to help you breathe, it will be removed before you wake up. The procedure may vary among health care providers and hospitals. What happens after the procedure?  You will wake up, often slowly, after the procedure is complete, usually in a recovery area.  Your blood pressure, heart rate, breathing rate, and blood oxygen level will be monitored until the medicines you were given have worn off.  You may be given medicine to help you calm down if you feel anxious or agitated.  If you will be going home the same day, your health care provider may check to make sure you can stand, drink, and urinate.  Your health care providers will treat your pain  and side effects before you go home.  Do not drive for 24 hours if you received a sedative.  You may: ? Feel nauseous and vomit. ? Have a sore throat. ? Have mental slowness. ? Feel cold or shivery. ? Feel sleepy. ? Feel tired. ? Feel sore or achy, even in parts of your body  where you did not have surgery. This information is not intended to replace advice given to you by your health care provider. Make sure you discuss any questions you have with your health care provider. Document Released: 08/16/2007 Document Revised: 10/20/2015 Document Reviewed: 04/23/2015 Elsevier Interactive Patient Education  2018 [place] Anesthesia, Adult, Care After These instructions provide you with information about caring for yourself after your procedure. Your health care provider may also give you more specific instructions. Your treatment has been planned according to current medical practices, but problems sometimes occur. Call your health care provider if you have any problems or questions after your procedure. What can I expect after the procedure? After the procedure, it is common to have:  Vomiting.  A sore throat.  Mental slowness.  It is common to feel:  Nauseous.  Cold or shivery.  Sleepy.  Tired.  Sore or achy, even in parts of your body where you did not have surgery.  Follow these instructions at home: For at least 24 hours after the procedure:  Do not: ? Participate in activities where you could fall or become injured. ? Drive. ? Use heavy machinery. ? Drink alcohol. ? Take sleeping pills or medicines that cause drowsiness. ? Make important decisions or sign legal documents. ? Take care of children on your own.  Rest. Eating and drinking  If you vomit, drink water, juice, or soup when you can drink without vomiting.  Drink enough fluid to keep your urine clear or pale yellow.  Make sure you have little or no nausea before eating solid foods.  Follow the diet recommended by your health care provider. General instructions  Have a responsible adult stay with you until you are awake and alert.  Return to your normal activities as told by your health care provider. Ask your health care provider what activities are safe for  you.  Take over-the-counter and prescription medicines only as told by your health care provider.  If you smoke, do not smoke without supervision.  Keep all follow-up visits as told by your health care provider. This is important. Contact a health care provider if:  You continue to have nausea or vomiting at home, and medicines are not helpful.  You cannot drink fluids or start eating again.  You cannot urinate after 8-12 hours.  You develop a skin rash.  You have fever.  You have increasing redness at the site of your procedure. Get help right away if:  You have difficulty breathing.  You have chest pain.  You have unexpected bleeding.  You feel that you are having a life-threatening or urgent problem. This information is not intended to replace advice given to you by your health care provider. Make sure you discuss any questions you have with your health care provider. Document Released: 08/15/2000 Document Revised: 10/12/2015 Document Reviewed: 04/23/2015 Elsevier Interactive Patient Education  [employer].

## 2018-04-12 ENCOUNTER — Other Ambulatory Visit: Payer: Self-pay | Admitting: Orthopedic Surgery

## 2018-04-12 DIAGNOSIS — M2341 Loose body in knee, right knee: Secondary | ICD-10-CM

## 2018-04-12 DIAGNOSIS — M23321 Other meniscus derangements, posterior horn of medial meniscus, right knee: Secondary | ICD-10-CM

## 2018-04-18 ENCOUNTER — Encounter: Payer: Self-pay | Admitting: Orthopedic Surgery

## 2018-04-18 ENCOUNTER — Encounter (HOSPITAL_COMMUNITY): Payer: Self-pay

## 2018-04-18 ENCOUNTER — Encounter (HOSPITAL_COMMUNITY)
Admission: RE | Admit: 2018-04-18 | Discharge: 2018-04-18 | Disposition: A | Discharge status: Home / Self Care | Payer: 59 | Source: Ambulatory Visit | Attending: Orthopedic Surgery | Admitting: Orthopedic Surgery

## 2018-04-18 DIAGNOSIS — M67461 Ganglion, right knee: Secondary | ICD-10-CM | POA: Insufficient documentation

## 2018-04-18 DIAGNOSIS — Z01818 Encounter for other preprocedural examination: Secondary | ICD-10-CM | POA: Diagnosis not present

## 2018-04-18 HISTORY — DX: Unspecified asthma, uncomplicated: J45.909

## 2018-04-18 LAB — BASIC METABOLIC PANEL
ANION GAP: 6 (ref 5–15)
BUN: 15 mg/dL (ref 6–20)
CHLORIDE: 109 mmol/L (ref 98–111)
CO2: 24 mmol/L (ref 22–32)
CREATININE: 0.85 mg/dL (ref 0.44–1.00)
Calcium: 8.5 mg/dL — ABNORMAL LOW (ref 8.9–10.3)
GFR calc non Af Amer: >60 mL/min (ref >60)
Glucose, Bld: 95 mg/dL (ref 70–99)
POTASSIUM: 4.1 mmol/L (ref 3.5–5.1)
SODIUM: 139 mmol/L (ref 135–145)

## 2018-04-18 LAB — CBC WITH DIFFERENTIAL/PLATELET
Abs Immature Granulocytes: 0.01 10*3/uL (ref 0.00–0.07)
BASOS ABS: 0 10*3/uL (ref 0.0–0.1)
Basophils Relative: 0 %
EOS PCT: 2 %
Eosinophils Absolute: 0.1 10*3/uL (ref 0.0–0.5)
HEMATOCRIT: 42.9 % (ref 36.0–46.0)
HEMOGLOBIN: 13.5 g/dL (ref 12.0–15.0)
Immature Granulocytes: 0 %
LYMPHS PCT: 39 %
Lymphs Abs: 2.6 10*3/uL (ref 0.7–4.0)
MCH: 30.9 pg (ref 26.0–34.0)
MCHC: 31.5 g/dL (ref 30.0–36.0)
MCV: 98.2 fL (ref 80.0–100.0)
Monocytes Absolute: 0.4 10*3/uL (ref 0.1–1.0)
Monocytes Relative: 6 %
NRBC: 0 % (ref 0.0–0.2)
Neutro Abs: 3.5 10*3/uL (ref 1.7–7.7)
Neutrophils Relative %: 53 %
Platelets: 206 10*3/uL (ref 150–400)
RBC: 4.37 MIL/uL (ref 3.87–5.11)
RDW: 11.7 % (ref 11.5–15.5)
WBC: 6.7 10*3/uL (ref 4.0–10.5)

## 2018-04-18 LAB — HCG, SERUM, QUALITATIVE: Preg, Serum: NEGATIVE

## 2018-04-23 ENCOUNTER — Encounter (HOSPITAL_COMMUNITY): Payer: Self-pay

## 2018-04-23 ENCOUNTER — Ambulatory Visit (HOSPITAL_COMMUNITY)
Admission: RE | Admit: 2018-04-23 | Discharge: 2018-04-23 | Disposition: A | Discharge status: Home / Self Care | Payer: 59 | Source: Ambulatory Visit | Attending: Obstetrics and Gynecology | Admitting: Obstetrics and Gynecology

## 2018-04-23 DIAGNOSIS — Z1231 Encounter for screening mammogram for malignant neoplasm of breast: Secondary | ICD-10-CM | POA: Insufficient documentation

## 2018-04-23 DIAGNOSIS — M1711 Unilateral primary osteoarthritis, right knee: Secondary | ICD-10-CM | POA: Diagnosis not present

## 2018-04-23 DIAGNOSIS — M1712 Unilateral primary osteoarthritis, left knee: Secondary | ICD-10-CM | POA: Diagnosis not present

## 2018-04-25 NOTE — H&P (Signed)
Chief Complaint  Patient presents with  . Results      review MRI right knee      HPI 47 year old female presented to [place] with complaints of trouble kneeling on her left knee painful bending of the left knee she had an MRI and it showed that she has a ganglion cyst in her ACL.  I looked at this and she does indeed have a small cyst there.  I imagine that it could cause some discomfort kneeling.  However, she does not have any meniscal tears or ligament pathology.   We should note that there was some thickening and increased signal in the anterior cruciate ligament which is consistent with mucoid degeneration   She is aware that if she does have surgery which was presented as an option she may or may not get better   However, she does not want to leave things as they are     Location RIGHT KNEE PAIN  Duration 2 MONTHS  Quality DULL ACHE  Severity MOD  Associated with ability to fully extend.  Cannot flex the knee enough to get into her bed.   Review of systems unchanged from March 21, 2018 Review of Systems  Musculoskeletal: Positive for back pain.  Neurological:       Right lateral leg pain dermatome L5  All other systems reviewed and are negative.   Active Medications      Current Meds  Medication Sig  . VENTOLIN HFA 108 (90 BASE) MCG/ACT inhaler Inhale 2 puffs into the lungs every 4 (four) hours as needed for wheezing or shortness of breath.             Past Medical History:  Diagnosis Date  . Anxiety    . Cellulitis 04/2001    LEFT FOOT  . Diverticulitis    . Hematuria      BENIGN--SAW UROLOGIST  . History of abnormal cervical Pap smear 04/30/2015  . LGSIL (low grade squamous intraepithelial dysplasia) 1997  . Miscarriage 08/02/2013  . Patient desires pregnancy 11/18/2013  . Seizure disorder ([place])      AS A CHILD--NO MEDS SINCE 18 YO  . Smoker    . Vaginal Pap smear, abnormal               Allergies  Allergen Reactions  . Prednisone Other (See Comments)        Paranoia        BP 138/88   Pulse 71   Ht 5' 4.03" (1.626 m)   Wt 193 lb (87.5 kg)   BMI 33.10 kg/m      Physical Exam   Ortho Exam previous exam is unchanged PHYSICAL EXAM SECTION: General appearance: Well-developed well-nourished no gross deformities   Eyes clear normal vision no evidence of conjunctivitis or jaundice, extraocular muscles intact   ENT: ears hearing normal, nasal passages clear, throat clear   Lymph nodes: No lymphadenopathy   Neck is supple without palpable mass, full range of motion  Cardiovascular normal pulse and perfusion in all 4 extremities normal color without edema  Neurologically deep tendon reflexes are equal and normal, no sensation loss or deficits no pathologic reflexes   Psychological: Awake alert and oriented x3 mood and affect normal   Skin no lacerations or ulcerations no nodularity no palpable masses, no erythema or nodularity   Musculoskeletal:   Right Knee Exam    Muscle Strength  The patient has normal right knee strength.   Tenderness  The patient is experiencing  tenderness in the medial joint line and patella (Her patellar tenderness and crepitance).   Range of Motion  Extension:  5 normal  Flexion:  120 normal    Tests  McMurray:  Medial - positive Lateral - negative Varus: negative Valgus: negative Drawer:  Anterior - negative    Posterior - negative   Other  Erythema: absent Scars: absent Sensation: normal Pulse: present Swelling: none Effusion: effusion present     Left Knee Exam    Muscle Strength  The patient has normal left knee strength.   Tenderness  The patient is experiencing no tenderness.    Range of Motion  Extension: normal  Flexion: normal    Tests  McMurray:  Medial - negative Lateral - negative Varus: negative Valgus: negative Drawer:  Anterior - negative     Posterior - negative   Other  Erythema: absent Scars: absent Sensation: normal Pulse: present Swelling:  none Effusion: no effusion present               MEDICAL DECISION MAKING    Imaging:  COMPARISON:  Plain films right knee 03/21/2018.   FINDINGS: MENISCI   Medial meniscus:  Intact.   Lateral meniscus:  Intact.   LIGAMENTS   Cruciates: Intact. There is thickening and intrasubstance increased T2 signal in the anterior cruciate ligament consistent with mucoid degeneration. An ACL ganglion cyst anterior to the tibial attachment of the ligament measures 1.4 cm transverse by 0.9 cm craniocaudal by 1.1 cm AP.   Collaterals:  Intact.   CARTILAGE   Patellofemoral:  Normal.   Medial:  Normal.   Lateral:  Normal.   Joint:  Small effusion.   Popliteal Fossa:  No Baker's cyst.   Extensor Mechanism:  Intact.   Bones:  Normal marrow signal throughout.   Other: None.   IMPRESSION: Mucoid degeneration of the ACL with an associated ACL ganglion anterior to the tibial attachment. The study is otherwise normal. Negative for meniscal or ligament tear.     Electronically Signed   By: Inge Rise M.D.   On: 03/30/2018 13:01           Encounter Diagnosis  Name Primary?  . Loose, body, joint, knee, right Yes    Ganglion cyst is in the ACL area.  It is reported as loose body for lack of other I CD10 diagnostic code   PLAN: (RX., injection, surgery,frx,mri/ct, XR 2 body ares) Arthroscopy and removal of ganglion cyst right knee

## 2018-04-26 ENCOUNTER — Encounter (HOSPITAL_COMMUNITY)
Admission: RE | Disposition: A | Discharge status: Home / Self Care | Payer: Self-pay | Source: Ambulatory Visit | Attending: Orthopedic Surgery

## 2018-04-26 ENCOUNTER — Ambulatory Visit (HOSPITAL_COMMUNITY): Payer: 59 | Admitting: Anesthesiology

## 2018-04-26 ENCOUNTER — Encounter (HOSPITAL_COMMUNITY): Payer: Self-pay | Admitting: *Deleted

## 2018-04-26 ENCOUNTER — Ambulatory Visit (HOSPITAL_COMMUNITY)
Admission: RE | Admit: 2018-04-26 | Discharge: 2018-04-26 | Disposition: A | Discharge status: Home / Self Care | Payer: 59 | Source: Ambulatory Visit | Attending: Orthopedic Surgery | Admitting: Orthopedic Surgery

## 2018-04-26 DIAGNOSIS — F17200 Nicotine dependence, unspecified, uncomplicated: Secondary | ICD-10-CM | POA: Diagnosis not present

## 2018-04-26 DIAGNOSIS — Z79899 Other long term (current) drug therapy: Secondary | ICD-10-CM | POA: Diagnosis not present

## 2018-04-26 DIAGNOSIS — M67461 Ganglion, right knee: Secondary | ICD-10-CM | POA: Insufficient documentation

## 2018-04-26 DIAGNOSIS — J45909 Unspecified asthma, uncomplicated: Secondary | ICD-10-CM | POA: Diagnosis not present

## 2018-04-26 DIAGNOSIS — M795 Residual foreign body in soft tissue: Secondary | ICD-10-CM | POA: Diagnosis not present

## 2018-04-26 DIAGNOSIS — Z888 Allergy status to other drugs, medicaments and biological substances status: Secondary | ICD-10-CM | POA: Insufficient documentation

## 2018-04-26 HISTORY — PX: KNEE ARTHROSCOPY: SHX127

## 2018-04-26 SURGERY — ARTHROSCOPY, KNEE
Anesthesia: General | Site: Knee | Laterality: Right

## 2018-04-26 MED ORDER — MIDAZOLAM HCL 5 MG/5ML IJ SOLN
INTRAMUSCULAR | Status: DC | PRN
  Administered 2018-04-26: 2 mg via INTRAVENOUS

## 2018-04-26 MED ORDER — KETOROLAC TROMETHAMINE 30 MG/ML IJ SOLN
INTRAMUSCULAR | Status: AC
  Filled 2018-04-26: qty 1

## 2018-04-26 MED ORDER — CHLORHEXIDINE GLUCONATE 4 % EX LIQD: 60.0000 mL | Freq: Once | CUTANEOUS | Status: DC

## 2018-04-26 MED ORDER — BUPIVACAINE-EPINEPHRINE (PF) 0.25% -1:200000 IJ SOLN
INTRAMUSCULAR | Status: DC | PRN
  Administered 2018-04-26: 60 mL via PERINEURAL

## 2018-04-26 MED ORDER — HYDROMORPHONE HCL 1 MG/ML IJ SOLN
0.2500 mg | INTRAMUSCULAR | Status: DC | PRN
  Administered 2018-04-26: 0.5 mg via INTRAVENOUS
  Filled 2018-04-26: qty 0.5

## 2018-04-26 MED ORDER — POLYETHYLENE GLYCOL 3350 17 G PO PACK: 17.0000 g | PACK | Freq: Every day | ORAL | 0 refills | Status: DC

## 2018-04-26 MED ORDER — ONDANSETRON HCL 4 MG/2ML IJ SOLN
INTRAMUSCULAR | Status: AC
  Filled 2018-04-26: qty 2

## 2018-04-26 MED ORDER — EPINEPHRINE PF 1 MG/ML IJ SOLN
INTRAMUSCULAR | Status: AC
  Filled 2018-04-26: qty 5

## 2018-04-26 MED ORDER — HYDROCODONE-ACETAMINOPHEN 5-325 MG PO TABS: 1.0000 | ORAL_TABLET | ORAL | 0 refills | Status: AC | PRN

## 2018-04-26 MED ORDER — KETOROLAC TROMETHAMINE 30 MG/ML IJ SOLN
30.0000 mg | Freq: Once | INTRAMUSCULAR | Status: AC
  Administered 2018-04-26: 30 mg via INTRAVENOUS

## 2018-04-26 MED ORDER — CEFAZOLIN SODIUM-DEXTROSE 2-4 GM/100ML-% IV SOLN
2.0000 g | INTRAVENOUS | Status: AC
  Administered 2018-04-26: 2 g via INTRAVENOUS
  Filled 2018-04-26: qty 100

## 2018-04-26 MED ORDER — 0.9 % SODIUM CHLORIDE (POUR BTL) OPTIME
TOPICAL | Status: DC | PRN
  Administered 2018-04-26: 1000 mL

## 2018-04-26 MED ORDER — IBUPROFEN 800 MG PO TABS: 800.0000 mg | ORAL_TABLET | Freq: Three times a day (TID) | ORAL | 1 refills | Status: DC | PRN

## 2018-04-26 MED ORDER — PROPOFOL 10 MG/ML IV BOLUS
INTRAVENOUS | Status: AC
  Filled 2018-04-26: qty 40

## 2018-04-26 MED ORDER — FENTANYL CITRATE (PF) 100 MCG/2ML IJ SOLN
INTRAMUSCULAR | Status: AC
  Filled 2018-04-26: qty 2

## 2018-04-26 MED ORDER — ONDANSETRON HCL 4 MG/2ML IJ SOLN
4.0000 mg | Freq: Once | INTRAMUSCULAR | Status: AC
  Administered 2018-04-26: 4 mg via INTRAVENOUS

## 2018-04-26 MED ORDER — HYDROCODONE-ACETAMINOPHEN 7.5-325 MG PO TABS: 1.0000 | ORAL_TABLET | Freq: Once | ORAL | Status: DC | PRN

## 2018-04-26 MED ORDER — MIDAZOLAM HCL 2 MG/2ML IJ SOLN
INTRAMUSCULAR | Status: AC
  Filled 2018-04-26: qty 2

## 2018-04-26 MED ORDER — SODIUM CHLORIDE 0.9 % IR SOLN
Status: DC | PRN
  Administered 2018-04-26 (×4): 3000 mL

## 2018-04-26 MED ORDER — PROMETHAZINE HCL 25 MG/ML IJ SOLN: 6.2500 mg | INTRAMUSCULAR | Status: DC | PRN

## 2018-04-26 MED ORDER — PROPOFOL 10 MG/ML IV BOLUS
INTRAVENOUS | Status: DC | PRN
  Administered 2018-04-26: 170 mg via INTRAVENOUS

## 2018-04-26 MED ORDER — FENTANYL CITRATE (PF) 100 MCG/2ML IJ SOLN
INTRAMUSCULAR | Status: DC | PRN
  Administered 2018-04-26: 50 ug via INTRAVENOUS
  Administered 2018-04-26 (×2): 25 ug via INTRAVENOUS

## 2018-04-26 MED ORDER — PROMETHAZINE HCL 12.5 MG PO TABS: 12.5000 mg | ORAL_TABLET | Freq: Four times a day (QID) | ORAL | 0 refills | Status: DC | PRN

## 2018-04-26 MED ORDER — LACTATED RINGERS IV SOLN
INTRAVENOUS | Status: DC
  Administered 2018-04-26: 1000 mL via INTRAVENOUS

## 2018-04-26 MED ORDER — ONDANSETRON HCL 4 MG/2ML IJ SOLN
INTRAMUSCULAR | Status: DC | PRN
  Administered 2018-04-26: 4 mg via INTRAVENOUS

## 2018-04-26 MED ORDER — LIDOCAINE HCL 1 % IJ SOLN
INTRAMUSCULAR | Status: DC | PRN
  Administered 2018-04-26: 50 mg via INTRADERMAL

## 2018-04-26 MED ORDER — MEPERIDINE HCL 50 MG/ML IJ SOLN: 6.2500 mg | INTRAMUSCULAR | Status: DC | PRN

## 2018-04-26 MED ORDER — BUPIVACAINE-EPINEPHRINE (PF) 0.25% -1:200000 IJ SOLN
INTRAMUSCULAR | Status: AC
  Filled 2018-04-26: qty 60

## 2018-04-26 MED ORDER — LACTATED RINGERS IV SOLN: INTRAVENOUS | Status: DC

## 2018-04-26 SURGICAL SUPPLY — 49 items
ARTHROWAND PARAGON T2 (SURGICAL WAND)
BANDAGE ELASTIC 6 LF NS (GAUZE/BANDAGES/DRESSINGS) ×2 IMPLANT
BLADE AGGRESSIVE PLUS 4.0 (BLADE) ×2 IMPLANT
BLADE SURG SZ11 CARB STEEL (BLADE) ×2 IMPLANT
BNDG CMPR MED 5X6 ELC HKLP NS (GAUZE/BANDAGES/DRESSINGS) ×1
CHLORAPREP W/TINT 26ML (MISCELLANEOUS) ×2 IMPLANT
CLOTH BEACON ORANGE TIMEOUT ST (SAFETY) ×2 IMPLANT
COOLER CRYO IC GRAV AND TUBE (ORTHOPEDIC SUPPLIES) ×2 IMPLANT
COVER WAND RF STERILE (DRAPES) ×1 IMPLANT
CUFF CRYO KNEE18X23 MED (MISCELLANEOUS) ×2 IMPLANT
CUFF TOURNIQUET SINGLE 34IN LL (TOURNIQUET CUFF) ×2 IMPLANT
CUTTER ANGLED DBL BITE 4.5 (BURR) IMPLANT
DECANTER SPIKE VIAL GLASS SM (MISCELLANEOUS) ×4 IMPLANT
GAUZE 4X4 16PLY RFD (DISPOSABLE) ×2 IMPLANT
GAUZE SPONGE 4X4 12PLY STRL (GAUZE/BANDAGES/DRESSINGS) ×1 IMPLANT
GAUZE SPONGE 4X4 16PLY XRAY LF (GAUZE/BANDAGES/DRESSINGS) ×2 IMPLANT
GAUZE XEROFORM 5X9 LF (GAUZE/BANDAGES/DRESSINGS) ×2 IMPLANT
GLOVE BIO SURGEON STRL SZ7 (GLOVE) ×1 IMPLANT
GLOVE BIOGEL PI IND STRL 7.0 (GLOVE) ×2 IMPLANT
GLOVE BIOGEL PI INDICATOR 7.0 (GLOVE) ×2
GLOVE SKINSENSE NS SZ8.0 LF (GLOVE) ×1
GLOVE SKINSENSE STRL SZ8.0 LF (GLOVE) ×1 IMPLANT
GLOVE SS N UNI LF 8.5 STRL (GLOVE) ×2 IMPLANT
GOWN STRL REUS W/TWL LRG LVL3 (GOWN DISPOSABLE) ×2 IMPLANT
GOWN STRL REUS W/TWL XL LVL3 (GOWN DISPOSABLE) ×2 IMPLANT
IV NS IRRIG 3000ML ARTHROMATIC (IV SOLUTION) ×8 IMPLANT
KIT BLADEGUARD II DBL (SET/KITS/TRAYS/PACK) ×2 IMPLANT
KIT TURNOVER CYSTO (KITS) ×2 IMPLANT
MANIFOLD NEPTUNE II (INSTRUMENTS) ×2 IMPLANT
MARKER SKIN DUAL TIP RULER LAB (MISCELLANEOUS) ×2 IMPLANT
NEEDLE HYPO 18GX1.5 BLUNT FILL (NEEDLE) ×2 IMPLANT
NEEDLE HYPO 21X1.5 SAFETY (NEEDLE) ×2 IMPLANT
NEEDLE SPNL 18GX3.5 QUINCKE PK (NEEDLE) ×2 IMPLANT
NS IRRIG 1000ML POUR BTL (IV SOLUTION) ×2 IMPLANT
PACK ARTHRO LIMB DRAPE STRL (MISCELLANEOUS) ×2 IMPLANT
PAD ABD 5X9 TENDERSORB (GAUZE/BANDAGES/DRESSINGS) ×2 IMPLANT
PAD ARMBOARD 7.5X6 YLW CONV (MISCELLANEOUS) ×2 IMPLANT
PADDING CAST COTTON 6X4 STRL (CAST SUPPLIES) ×2 IMPLANT
PADDING WEBRIL 6 STERILE (GAUZE/BANDAGES/DRESSINGS) ×2 IMPLANT
PROBE BIPOLAR 50 DEGREE SUCT (MISCELLANEOUS) IMPLANT
PROBE BIPOLAR ATHRO 135MM 90D (MISCELLANEOUS) ×2 IMPLANT
SET ARTHROSCOPY INST (INSTRUMENTS) ×2 IMPLANT
SET BASIN LINEN APH (SET/KITS/TRAYS/PACK) ×2 IMPLANT
SUT ETHILON 3 0 FSL (SUTURE) ×2 IMPLANT
SYR 30ML LL (SYRINGE) ×2 IMPLANT
SYR CONTROL 10ML LL (SYRINGE) ×2 IMPLANT
TUBE CONNECTING 12X1/4 (SUCTIONS) ×6 IMPLANT
TUBING ARTHRO INFLOW-ONLY STRL (TUBING) ×2 IMPLANT
WAND ARTHRO PARAGON T2 (SURGICAL WAND) IMPLANT

## 2018-04-26 NOTE — Anesthesia Procedure Notes (Signed)
Procedure Name: LMA Insertion Date/Time: 04/26/2018 7:39 AM Performed by: Charmaine Downs, CRNA Pre-anesthesia Checklist: Patient identified, Patient being monitored, Emergency Drugs available, Timeout performed and Suction available Patient Re-evaluated:Patient Re-evaluated prior to induction Oxygen Delivery Method: Circle System Utilized Preoxygenation: Pre-oxygenation with 100% oxygen Induction Type: IV induction Ventilation: Mask ventilation without difficulty LMA: LMA inserted LMA Size: 4.0 Number of attempts: 1 Placement Confirmation: positive ETCO2 and breath sounds checked- equal and bilateral Tube secured with: Tape Dental Injury: Teeth and Oropharynx as per pre-operative assessment

## 2018-04-26 NOTE — Transfer of Care (Signed)
Immediate Anesthesia Transfer of Care Note  Patient: Debbie Young  Procedure(s) Performed: ARTHROSCOPY KNEE with removal of ganglion of Anterior Cruciate Ligament (Right Knee)  Patient Location: PACU  Anesthesia Type:General  Level of Consciousness: awake and patient cooperative  Airway & Oxygen Therapy: Patient Spontanous Breathing  Post-op Assessment: Report given to RN, Post -op Vital signs reviewed and stable and Patient moving all extremities  Post vital signs: Reviewed and stable  Last Vitals:  Vitals Value Taken Time  BP    Temp    Pulse    Resp    SpO2      Last Pain:  Vitals:   04/26/18 0642  TempSrc: Oral  PainSc: 3       Patients Stated Pain Goal: 6 (12/78/71 8367)  Complications: No apparent anesthesia complications

## 2018-04-26 NOTE — Anesthesia Postprocedure Evaluation (Signed)
Anesthesia Post Note  Patient: Debbie Young  Procedure(s) Performed: ARTHROSCOPY KNEE with removal of ganglion of Anterior Cruciate Ligament (Right Knee)  Patient location during evaluation: PACU Anesthesia Type: General Level of consciousness: awake and patient cooperative Pain management: pain level controlled Vital Signs Assessment: post-procedure vital signs reviewed and stable Respiratory status: spontaneous breathing, nonlabored ventilation and respiratory function stable Cardiovascular status: blood pressure returned to baseline Postop Assessment: no apparent nausea or vomiting Anesthetic complications: no     Last Vitals:  Vitals:   04/26/18 0835 04/26/18 0845  BP: 117/68 112/68  Pulse: 72 69  Resp: 17 17  Temp: 36.4 C   SpO2: 100% 100%    Last Pain:  Vitals:   04/26/18 0835  TempSrc:   PainSc: 6                  Shar Paez J

## 2018-04-26 NOTE — Op Note (Signed)
04/26/2018  8:30 AM  PATIENT:  Debbie Young  47 y.o. female  PRE-OPERATIVE DIAGNOSIS:  ganglion cyst right ACL  POST-OPERATIVE DIAGNOSIS:  ganglion cyst right ACL  PROCEDURE:  Procedure(s): ARTHROSCOPY KNEE with removal of ganglion of Anterior Cruciate Ligament (Right)   Findings: Medium size ganglion cyst tibial insertion ACL Medial and lateral compartment patellofemoral compartment had normal articular surfaces and medial and lateral menisci were normal  Knee arthroscopy dictation  The patient was identified in the preoperative holding area using 2 approved identification mechanisms. The chart was reviewed and updated. The surgical site was confirmed as right knee and marked with an indelible marker.  The patient was taken to the operating room for anesthesia. After successful general anesthesia, Ancef was used as IV antibiotics.  The patient was placed in the supine position with the (right) the operative extremity in an arthroscopic leg holder and the opposite extremity in a padded leg holder.  The timeout was executed.  A lateral portal was established with an 11 blade and the scope was introduced into the joint. A diagnostic arthroscopy was performed in circumferential manner examining the entire knee joint. A medial portal was established and the diagnostic arthroscopy was repeated using a probe to palpate intra-articular structures as they were encountered.   The ganglion was found at the tibial insertion of the ACL and blocked extension this was removed with a motorized shaver and arthroscopic wand.  The knee was then taken into extension and the tibial insertion still had a bulbous configuration and was debrided until full extension was obtained   The arthroscopic pump was placed on the wash mode and any excess debris was removed from the joint using suction.  60 cc of Marcaine with epinephrine was injected through the arthroscope.  The portals were closed with 3-0 nylon  suture.  A sterile bandage, Ace wrap and Cryo/Cuff was placed and the Cryo/Cuff was activated. The patient was taken to the recovery room in stable condition.    SURGEON:  Surgeon(s) and Role:    * Carole Civil, MD - Primary  PHYSICIAN ASSISTANT:   ASSISTANTS: none   ANESTHESIA:   general  EBL:  5 mL   BLOOD ADMINISTERED:none  DRAINS: none   LOCAL MEDICATIONS USED:  MARCAINE     SPECIMEN:  No Specimen  DISPOSITION OF SPECIMEN:  N/A  COUNTS:  YES  TOURNIQUET:  * Missing tourniquet times found for documented tourniquets in log: [id number] *  DICTATION: .Dragon Dictation  PLAN OF CARE: Discharge to home after PACU  PATIENT DISPOSITION:  PACU - hemodynamically stable.   Delay start of Pharmacological VTE agent (>24hrs) due to surgical blood loss or risk of bleeding: not applicable

## 2018-04-26 NOTE — Anesthesia Preprocedure Evaluation (Signed)
Anesthesia Evaluation    Airway Mallampati: II       Dental  (+) Teeth Intact   Pulmonary asthma , Current Smoker,    breath sounds clear to auscultation       Cardiovascular  Rhythm:regular     Neuro/Psych Seizures -,     GI/Hepatic   Endo/Other    Renal/GU      Musculoskeletal   Abdominal   Peds  Hematology   Anesthesia Other Findings No sz activity since age 47, no meds No issues with asthma "in years", no inhalers  Reproductive/Obstetrics                             Anesthesia Physical Anesthesia Plan  ASA: II  Anesthesia Plan: General   Post-op Pain Management:    Induction:   PONV Risk Score and Plan:   Airway Management Planned:   Additional Equipment:   Intra-op Plan:   Post-operative Plan:   Informed Consent:   Dental Advisory Given  Plan Discussed with: Anesthesiologist  Anesthesia Plan Comments:         Anesthesia Quick Evaluation

## 2018-04-26 NOTE — Discharge Instructions (Signed)
Surgery was successful  I removed a ganglion cyst from your ACL.  The remaining portions of your knee were completely normal.  Starting on Saturday please try to get that knee straight work on pushing the knee down towards the bed with a floor with the knee in full extension      Knee Arthroscopy, Care After Refer to this sheet in the next few weeks. These instructions provide you with information about caring for yourself after your procedure. Your health care provider may also give you more specific instructions. Your treatment has been planned according to current medical practices, but problems sometimes occur. Call your health care provider if you have any problems or questions after your procedure. What can I expect after the procedure? After the procedure, it is common to have:  Soreness.  Pain.  Follow these instructions at home: Bathing  Do not take baths, swim, or use a hot tub until your health care provider approves. Incision care  There are many different ways to close and cover an incision, including stitches, skin glue, and adhesive strips. Follow your health care providers instructions about: ? Incision care. ? Bandage (dressing) changes and removal. ? Incision closure removal.  Check your incision area every day for signs of infection. Watch for: ? Redness, swelling, or pain. ? Fluid, blood, or pus. Activity  Avoid strenuous activities for as long as directed by your health care provider.  Return to your normal activities as directed by your health care provider. Ask your health care provider what activities are safe for you.  Perform range-of-motion exercises only as directed by your health care provider.  Do not lift anything that is heavier than 10 lb (4.5 kg).  Do not drive or operate heavy machinery while taking pain medicine.  If you were given crutches, use them as directed by your health care provider. Managing pain, stiffness, and  swelling  If directed, apply ice to the injured area: ? Put ice in a plastic bag. ? Place a towel between your skin and the bag. ? Leave the ice on for 20 minutes, 2-3 times per day.  Raise the injured area above the level of your heart while you are sitting or lying down as directed by your health care provider. General instructions  Keep all follow-up visits as directed by your health care provider. This is important.  Take medicines only as directed by your health care provider.  Do not use any tobacco products, including cigarettes, chewing tobacco, or electronic cigarettes. If you need help quitting, ask your health care provider.  If you were given compression stockings, wear them as directed by your health care provider. These stockings help prevent blood clots and reduce swelling in your legs. Contact a health care provider if:  You have severe pain with any movement of your knee.  You notice a bad smell coming from the incision or dressing.  You have redness, swelling, or pain at the site of your incision.  You have fluid, blood, or pus coming from your incision. Get help right away if:  You develop a rash.  You have a fever.  You have difficulty breathing or have shortness of breath.  You develop pain in your calves or in the back of your knee.  You develop chest pain.  You develop numbness or tingling in your leg or foot. This information is not intended to replace advice given to you by your health care provider. Make sure you discuss any questions you  have with your health care provider. Document Released: 11/26/2004 Document Revised: 10/09/2015 Document Reviewed: 05/05/2014 Elsevier Interactive Patient Education  2018 [place] Anesthesia, Adult, Care After These instructions provide you with information about caring for yourself after your procedure. Your health care provider may also give you more specific instructions. Your treatment has  been planned according to current medical practices, but problems sometimes occur. Call your health care provider if you have any problems or questions after your procedure. What can I expect after the procedure? After the procedure, it is common to have:  Vomiting.  A sore throat.  Mental slowness.  It is common to feel:  Nauseous.  Cold or shivery.  Sleepy.  Tired.  Sore or achy, even in parts of your body where you did not have surgery.  Follow these instructions at home: For at least 24 hours after the procedure:  Do not: ? Participate in activities where you could fall or become injured. ? Drive. ? Use heavy machinery. ? Drink alcohol. ? Take sleeping pills or medicines that cause drowsiness. ? Make important decisions or sign legal documents. ? Take care of children on your own.  Rest. Eating and drinking  If you vomit, drink water, juice, or soup when you can drink without vomiting.  Drink enough fluid to keep your urine clear or pale yellow.  Make sure you have little or no nausea before eating solid foods.  Follow the diet recommended by your health care provider. General instructions  Have a responsible adult stay with you until you are awake and alert.  Return to your normal activities as told by your health care provider. Ask your health care provider what activities are safe for you.  Take over-the-counter and prescription medicines only as told by your health care provider.  If you smoke, do not smoke without supervision.  Keep all follow-up visits as told by your health care provider. This is important. Contact a health care provider if:  You continue to have nausea or vomiting at home, and medicines are not helpful.  You cannot drink fluids or start eating again.  You cannot urinate after 8-12 hours.  You develop a skin rash.  You have fever.  You have increasing redness at the site of your procedure. Get help right away if:  You  have difficulty breathing.  You have chest pain.  You have unexpected bleeding.  You feel that you are having a life-threatening or urgent problem. This information is not intended to replace advice given to you by your health care provider. Make sure you discuss any questions you have with your health care provider. Document Released: 08/15/2000 Document Revised: 10/12/2015 Document Reviewed: 04/23/2015 Elsevier Interactive Patient Education  [employer].

## 2018-04-26 NOTE — Brief Op Note (Signed)
04/26/2018  8:30 AM  PATIENT:  Debbie Young  47 y.o. female  PRE-OPERATIVE DIAGNOSIS:  ganglion cyst right ACL  POST-OPERATIVE DIAGNOSIS:  ganglion cyst right ACL  PROCEDURE:  Procedure(s): ARTHROSCOPY KNEE with removal of ganglion of Anterior Cruciate Ligament (Right)   Findings: Medium size ganglion cyst tibial insertion ACL Medial and lateral compartment patellofemoral compartment had normal articular surfaces and medial and lateral menisci were normal  Knee arthroscopy dictation  The patient was identified in the preoperative holding area using 2 approved identification mechanisms. The chart was reviewed and updated. The surgical site was confirmed as right knee and marked with an indelible marker.  The patient was taken to the operating room for anesthesia. After successful general anesthesia, Ancef was used as IV antibiotics.  The patient was placed in the supine position with the (right) the operative extremity in an arthroscopic leg holder and the opposite extremity in a padded leg holder.  The timeout was executed.  A lateral portal was established with an 11 blade and the scope was introduced into the joint. A diagnostic arthroscopy was performed in circumferential manner examining the entire knee joint. A medial portal was established and the diagnostic arthroscopy was repeated using a probe to palpate intra-articular structures as they were encountered.   The ganglion was found at the tibial insertion of the ACL and blocked extension this was removed with a motorized shaver and arthroscopic wand.  The knee was then taken into extension and the tibial insertion still had a bulbous configuration and was debrided until full extension was obtained   The arthroscopic pump was placed on the wash mode and any excess debris was removed from the joint using suction.  60 cc of Marcaine with epinephrine was injected through the arthroscope.  The portals were closed with 3-0 nylon  suture.  A sterile bandage, Ace wrap and Cryo/Cuff was placed and the Cryo/Cuff was activated. The patient was taken to the recovery room in stable condition.    SURGEON:  Surgeon(s) and Role:    * Carole Civil, MD - Primary  PHYSICIAN ASSISTANT:   ASSISTANTS: none   ANESTHESIA:   general  EBL:  5 mL   BLOOD ADMINISTERED:none  DRAINS: none   LOCAL MEDICATIONS USED:  MARCAINE     SPECIMEN:  No Specimen  DISPOSITION OF SPECIMEN:  N/A  COUNTS:  YES  TOURNIQUET:  * Missing tourniquet times found for documented tourniquets in log: [id number] *  DICTATION: .Dragon Dictation  PLAN OF CARE: Discharge to home after PACU  PATIENT DISPOSITION:  PACU - hemodynamically stable.   Delay start of Pharmacological VTE agent (>24hrs) due to surgical blood loss or risk of bleeding: not applicable

## 2018-04-26 NOTE — Interval H&P Note (Signed)
History and Physical Interval Note:  04/26/2018 7:25 AM  Debbie Young  has presented today for surgery, with the diagnosis of ganglion cyst right ACL  The various methods of treatment have been discussed with the patient and family. After consideration of risks, benefits and other options for treatment, the patient has consented to  Procedure(s): ARTHROSCOPY KNEE with removal of ganglion of Anterior Cruciate Ligament (Right) as a surgical intervention .  The patient's history has been reviewed, patient examined, no change in status, stable for surgery.  I have reviewed the patient's chart and labs.  Questions were answered to the patient's satisfaction.     Arther Abbott

## 2018-04-27 ENCOUNTER — Encounter (HOSPITAL_COMMUNITY): Payer: Self-pay | Admitting: Orthopedic Surgery

## 2018-05-03 DIAGNOSIS — Z9889 Other specified postprocedural states: Secondary | ICD-10-CM | POA: Insufficient documentation

## 2018-05-04 ENCOUNTER — Encounter: Payer: Self-pay | Admitting: Orthopedic Surgery

## 2018-05-04 ENCOUNTER — Ambulatory Visit (INDEPENDENT_AMBULATORY_CARE_PROVIDER_SITE_OTHER): Payer: 59 | Admitting: Orthopedic Surgery

## 2018-05-04 DIAGNOSIS — Z9889 Other specified postprocedural states: Secondary | ICD-10-CM

## 2018-05-04 NOTE — Patient Instructions (Signed)
HOME EXERCISES  WALKER AS NEEDED RTW 18 DEC

## 2018-05-04 NOTE — Progress Notes (Signed)
POST OP APPT   Chief Complaint  Patient presents with  . Routine Post Op    right knee scope 04/26/18     04/26/2018  8:30 AM  PATIENT:  Debbie Young  47 y.o. female  PRE-OPERATIVE DIAGNOSIS:  ganglion cyst right ACL  POST-OPERATIVE DIAGNOSIS:  ganglion cyst right ACL  PROCEDURE:  Procedure(s): ARTHROSCOPY KNEE with removal of ganglion of Anterior Cruciate Ligament (Right)   Findings: Medium size ganglion cyst tibial insertion ACL Medial and lateral compartment patellofemoral compartment had normal articular surfaces and medial and lateral menisci were normal   Florabel is doing well after her right knee arthroscopy.  She had a ganglion cyst.  She has a slight decrease in extension and as well as flexion which is 90 degrees.  The portal sites look clean.  She is walking with a walker  I gave her home exercises to do and she will follow-up with me in 3 weeks  She can return to work on 18 December follow-up with me on January 3

## 2018-05-07 DIAGNOSIS — J111 Influenza due to unidentified influenza virus with other respiratory manifestations: Secondary | ICD-10-CM | POA: Diagnosis not present

## 2018-06-01 ENCOUNTER — Ambulatory Visit: Payer: 59 | Admitting: Orthopedic Surgery

## 2018-06-08 ENCOUNTER — Ambulatory Visit (INDEPENDENT_AMBULATORY_CARE_PROVIDER_SITE_OTHER): Payer: 59 | Admitting: Orthopedic Surgery

## 2018-06-08 ENCOUNTER — Encounter: Payer: Self-pay | Admitting: Orthopedic Surgery

## 2018-06-08 VITALS — BP 126/79 | HR 74 | Ht 64.0 in | Wt 191.0 lb

## 2018-06-08 DIAGNOSIS — Z9889 Other specified postprocedural states: Secondary | ICD-10-CM

## 2018-06-08 NOTE — Patient Instructions (Signed)
These are the muscle creams I recommend:  PLEASE READ THE PACKAGE INSTRUCTIONS BEFORE USING   Ben Gay arthritis cream  Icy hot vanishing gel  Aspercreme odor free  Myoflex Oderless pain reliever  Capzasin  Sportscreme  Max freeze 

## 2018-06-08 NOTE — Progress Notes (Signed)
Chief Complaint  Patient presents with  . Routine Post Op    Rt knee DOS 04/26/18   POD # 43  Debbie Young is doing well having some discomfort going up and down the steps but she can bend and straighten the knee normally now she regained all of her extension her knee is stable  We released her to normal activity  Encounter Diagnosis  Name Primary?  . S/P left knee arthroscopy 04/26/18 Yes     04/26/2018  8:30 AM  PATIENT:  Debbie Young  48 y.o. female  PRE-OPERATIVE DIAGNOSIS:  ganglion cyst right ACL  POST-OPERATIVE DIAGNOSIS:  ganglion cyst right ACL  PROCEDURE:  Procedure(s): ARTHROSCOPY KNEE with removal of ganglion of Anterior Cruciate Ligament (Right)   Findings: Medium size ganglion cyst tibial insertion ACL Medial and lateral compartment patellofemoral compartment had normal articular surfaces and medial and lateral menisci were normal

## 2018-06-15 DIAGNOSIS — Z719 Counseling, unspecified: Secondary | ICD-10-CM | POA: Diagnosis not present

## 2018-08-29 DIAGNOSIS — K5732 Diverticulitis of large intestine without perforation or abscess without bleeding: Secondary | ICD-10-CM | POA: Diagnosis not present

## 2018-08-29 DIAGNOSIS — Z681 Body mass index (BMI) 19 or less, adult: Secondary | ICD-10-CM | POA: Diagnosis not present

## 2018-08-29 DIAGNOSIS — E669 Obesity, unspecified: Secondary | ICD-10-CM | POA: Diagnosis not present

## 2019-02-09 ENCOUNTER — Other Ambulatory Visit: Payer: Self-pay

## 2019-02-09 ENCOUNTER — Emergency Department (HOSPITAL_COMMUNITY): Payer: 59

## 2019-02-09 ENCOUNTER — Emergency Department (HOSPITAL_COMMUNITY)
Admission: EM | Admit: 2019-02-09 | Discharge: 2019-02-10 | Disposition: A | Discharge status: Home / Self Care | Payer: 59 | Attending: Emergency Medicine | Admitting: Emergency Medicine

## 2019-02-09 ENCOUNTER — Encounter (HOSPITAL_COMMUNITY): Payer: Self-pay | Admitting: Emergency Medicine

## 2019-02-09 DIAGNOSIS — F17210 Nicotine dependence, cigarettes, uncomplicated: Secondary | ICD-10-CM | POA: Insufficient documentation

## 2019-02-09 DIAGNOSIS — Z79899 Other long term (current) drug therapy: Secondary | ICD-10-CM | POA: Diagnosis not present

## 2019-02-09 DIAGNOSIS — K5732 Diverticulitis of large intestine without perforation or abscess without bleeding: Secondary | ICD-10-CM | POA: Diagnosis not present

## 2019-02-09 DIAGNOSIS — R1031 Right lower quadrant pain: Secondary | ICD-10-CM | POA: Diagnosis present

## 2019-02-09 LAB — URINALYSIS, ROUTINE W REFLEX MICROSCOPIC
Bilirubin Urine: NEGATIVE
Glucose, UA: NEGATIVE mg/dL
Ketones, ur: NEGATIVE mg/dL
Leukocytes,Ua: NEGATIVE
Nitrite: NEGATIVE
Protein, ur: NEGATIVE mg/dL
Specific Gravity, Urine: 1.011 (ref 1.005–1.030)
pH: 5 (ref 5.0–8.0)

## 2019-02-09 MED ORDER — FENTANYL CITRATE (PF) 100 MCG/2ML IJ SOLN
50.0000 ug | Freq: Once | INTRAMUSCULAR | Status: AC
  Administered 2019-02-10: 50 ug via INTRAVENOUS
  Filled 2019-02-09: qty 2

## 2019-02-09 MED ORDER — ONDANSETRON HCL 4 MG/2ML IJ SOLN
4.0000 mg | Freq: Once | INTRAMUSCULAR | Status: AC
  Administered 2019-02-10: 4 mg via INTRAVENOUS
  Filled 2019-02-09: qty 2

## 2019-02-09 NOTE — ED Triage Notes (Signed)
Pt reports she had a sudden onset of right and pain around 1700 today, denies urinary symptoms, denies fever n/v/d, last BM 02/09/2019

## 2019-02-10 LAB — CBC WITH DIFFERENTIAL/PLATELET
Abs Immature Granulocytes: 0.02 10*3/uL (ref 0.00–0.07)
Basophils Absolute: 0 10*3/uL (ref 0.0–0.1)
Basophils Relative: 0 %
Eosinophils Absolute: 0.1 10*3/uL (ref 0.0–0.5)
Eosinophils Relative: 2 %
HCT: 41.4 % (ref 36.0–46.0)
Hemoglobin: 13.3 g/dL (ref 12.0–15.0)
Immature Granulocytes: 0 %
Lymphocytes Relative: 38 %
Lymphs Abs: 2.4 10*3/uL (ref 0.7–4.0)
MCH: 31.7 pg (ref 26.0–34.0)
MCHC: 32.1 g/dL (ref 30.0–36.0)
MCV: 98.8 fL (ref 80.0–100.0)
Monocytes Absolute: 0.4 10*3/uL (ref 0.1–1.0)
Monocytes Relative: 6 %
Neutro Abs: 3.4 10*3/uL (ref 1.7–7.7)
Neutrophils Relative %: 54 %
Platelets: 188 10*3/uL (ref 150–400)
RBC: 4.19 MIL/uL (ref 3.87–5.11)
RDW: 11.7 % (ref 11.5–15.5)
WBC: 6.3 10*3/uL (ref 4.0–10.5)
nRBC: 0 % (ref 0.0–0.2)

## 2019-02-10 LAB — COMPREHENSIVE METABOLIC PANEL
ALT: 18 U/L (ref 0–44)
AST: 18 U/L (ref 15–41)
Albumin: 4.4 g/dL (ref 3.5–5.0)
Alkaline Phosphatase: 62 U/L (ref 38–126)
Anion gap: 7 (ref 5–15)
BUN: 14 mg/dL (ref 6–20)
CO2: 26 mmol/L (ref 22–32)
Calcium: 8.4 mg/dL — ABNORMAL LOW (ref 8.9–10.3)
Chloride: 105 mmol/L (ref 98–111)
Creatinine, Ser: 0.79 mg/dL (ref 0.44–1.00)
GFR calc Af Amer: >60 mL/min (ref >60)
GFR calc non Af Amer: >60 mL/min (ref >60)
Glucose, Bld: 101 mg/dL — ABNORMAL HIGH (ref 70–99)
Potassium: 3.8 mmol/L (ref 3.5–5.1)
Sodium: 138 mmol/L (ref 135–145)
Total Bilirubin: 0.6 mg/dL (ref 0.3–1.2)
Total Protein: 7.2 g/dL (ref 6.5–8.1)

## 2019-02-10 LAB — LIPASE, BLOOD: Lipase: 33 U/L (ref 11–51)

## 2019-02-10 MED ORDER — KETOROLAC TROMETHAMINE 30 MG/ML IJ SOLN
30.0000 mg | Freq: Once | INTRAMUSCULAR | Status: AC
  Administered 2019-02-10: 30 mg via INTRAVENOUS
  Filled 2019-02-10: qty 1

## 2019-02-10 MED ORDER — IOHEXOL 300 MG/ML  SOLN
100.0000 mL | Freq: Once | INTRAMUSCULAR | Status: AC | PRN
  Administered 2019-02-10: 100 mL via INTRAVENOUS

## 2019-02-10 MED ORDER — ONDANSETRON HCL 4 MG PO TABS: 4.0000 mg | ORAL_TABLET | Freq: Three times a day (TID) | ORAL | 0 refills | Status: DC | PRN

## 2019-02-10 MED ORDER — NAPROXEN 500 MG PO TABS: 500.0000 mg | ORAL_TABLET | Freq: Two times a day (BID) | ORAL | 0 refills | Status: DC

## 2019-02-10 NOTE — ED Provider Notes (Signed)
[facility] EMERGENCY DEPARTMENT Provider Note   CSN: [id number] Arrival date & time: 02/09/19  2110   Time seen 11:27 PM (patient was in the bathroom at 11:15 PM)  History   Chief Complaint Chief Complaint  Patient presents with  . Abdominal Pain    HPI Debbie Young is a 48 y.o. female.     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  She describes the pain as constant and sharp.  She states sitting in a bent position makes the pain worse, laying flat eases it slightly.  She denies nausea, vomiting, dysuria, frequency, hematuria, fever.  She states she is never had this pain before.  She is status post C-section x2 and cholecystectomy.  She states she has had diverticulitis in the past however her pain is normally on the right side but this feels different from her prior diverticulitis.  Patient states her period should start in 4 to 5 days.  Patient notes that she has had microscopic hematuria for years.  [facility], [facility] GYN Dr Glo Herring  Past Medical History:  Diagnosis Date  . Anxiety   . Asthma    history of asthma as child  . Cellulitis 04/2001   LEFT FOOT  . Diverticulitis   . Hematuria    BENIGN--SAW UROLOGIST  . History of abnormal cervical Pap smear 04/30/2015  . LGSIL (low grade squamous intraepithelial dysplasia) 1997  . Miscarriage 08/02/2013  . Patient desires pregnancy 11/18/2013  . Seizure disorder ([place])    AS A CHILD--NO MEDS SINCE 13 YO  . Smoker   . Vaginal Pap smear, abnormal     Patient Active Problem List   Diagnosis Date Noted  . S/P left knee arthroscopy 04/26/18 05/03/2018  . Family history of breast cancer 08/04/2017  . Diverticulitis of large intestine with perforation without bleeding   . Diverticulitis 07/28/2017  . Status post tubal ligation 03/09/2017  . History of cesarean delivery 08/24/2016  . History  of preterm delivery 08/24/2016  . History of abnormal cervical Pap smear 04/30/2015  . Miscarriage 08/02/2013  . LGSIL (low grade squamous intraepithelial dysplasia)   . Cellulitis   . Seizure disorder ([place])   . Hematuria   . Smoker     Past Surgical History:  Procedure Laterality Date  . [place]  . CERVICAL BIOPSY  W/ LOOP ELECTRODE EXCISION  07/2002  . CESAREAN SECTION    . CESAREAN SECTION WITH BILATERAL TUBAL LIGATION Bilateral 03/09/2017   Procedure: REPEAT CESAREAN SECTION WITH BILATERAL TUBAL LIGATION;  Surgeon: Jonnie Kind, MD;  Location: [place];  Service: Obstetrics;  Laterality: Bilateral;  . CHOLECYSTECTOMY    . COLPOSCOPY    . KNEE ARTHROSCOPY Right 04/26/2018   Procedure: ARTHROSCOPY KNEE with removal of ganglion of Anterior Cruciate Ligament;  Surgeon: Carole Civil, MD;  Location: [facility];  Service: Orthopedics;  Laterality: Right;  . REFRACTIVE SURGERY  2000  . TONSILLECTOMY AND ADENOIDECTOMY       OB History    Gravida  4   Para  2   Term  1   Preterm  1   AB  2   Living  2     SAB  2   TAB      Ectopic      Multiple  0   Live Births  2  Home Medications    Prior to Admission medications   Medication Sig Start Date End Date Taking? Authorizing Provider  ibuprofen (ADVIL,MOTRIN) 800 MG tablet Take 1 tablet (800 mg total) by mouth every 8 (eight) hours as needed. 04/26/18  Yes Carole Civil, MD  naproxen (NAPROSYN) 500 MG tablet Take 1 tablet (500 mg total) by mouth 2 (two) times daily with a meal. 02/10/19   Rolland Porter, MD  ondansetron (ZOFRAN) 4 MG tablet Take 1 tablet (4 mg total) by mouth every 8 (eight) hours as needed. 02/10/19   Rolland Porter, MD  polyethylene glycol [facility]) packet Take 17 g by mouth daily. Patient not taking: Reported on 06/08/2018 04/26/18   Carole Civil, MD  promethazine (PHENERGAN) 12.5 MG tablet Take 1 tablet (12.5 mg total) by mouth every 6 (six) hours as  needed for nausea or vomiting. Patient not taking: Reported on 06/08/2018 04/26/18   Carole Civil, MD    Family History Family History  Problem Relation Age of Onset  . Breast cancer Mother 69  . Fibromyalgia Mother   . Von Willebrand disease Mother   . Breast cancer Maternal Aunt   . Breast cancer Maternal Grandmother   . Heart disease Maternal Grandmother   . Anuerysm Maternal Grandmother   . Heart failure Maternal Grandmother   . Hypertension Maternal Grandfather   . Stroke Maternal Grandfather   . Heart disease Paternal Grandmother   . Heart disease Paternal Grandfather   . Cancer Father        [place] and [place]   . Asthma Son     Social History Social History   Tobacco Use  . Smoking status: Current Every Day Smoker    Packs/day: 0.50    Years: 20.00    Pack years: 10.00    Types: Cigarettes  . Smokeless tobacco: Never Used  Substance Use Topics  . Alcohol use: Yes    Comment: social drink  . Drug use: No  employed Lives with spouse   Allergies   Prednisone   Review of Systems Review of Systems  All other systems reviewed and are negative.    Physical Exam Updated Vital Signs BP 120/79   Pulse 68   Temp 98.3 F (36.8 C) (Oral)   Resp 17   Ht 5\' 4"  (1.626 m)   Wt 83.5 kg   LMP 01/17/2019   SpO2 99%   BMI 31.58 kg/m   Vital signs normal    Physical Exam Vitals signs and nursing note reviewed.  Constitutional:      General: She is not in acute distress.    Appearance: Normal appearance. She is well-developed. She is not ill-appearing or toxic-appearing.  HENT:     Head: Normocephalic and atraumatic.     Right Ear: External ear normal.     Left Ear: External ear normal.     Nose: Nose normal. No mucosal edema or rhinorrhea.     Mouth/Throat:     Mouth: Mucous membranes are moist.     Dentition: No dental abscesses.     Pharynx: No oropharyngeal exudate, posterior oropharyngeal erythema or uvula  swelling.  Eyes:     Extraocular Movements: Extraocular movements intact.     Conjunctiva/sclera: Conjunctivae normal.     Pupils: Pupils are equal, round, and reactive to light.  Neck:     Musculoskeletal: Full passive range of motion without pain, normal range of motion and neck supple.  Cardiovascular:  Rate and Rhythm: Normal rate and regular rhythm.     Heart sounds: Normal heart sounds. No murmur. No friction rub. No gallop.   Pulmonary:     Effort: Pulmonary effort is normal. No respiratory distress.     Breath sounds: Normal breath sounds. No wheezing, rhonchi or rales.  Chest:     Chest wall: No tenderness or crepitus.  Abdominal:     General: Bowel sounds are normal. There is no distension.     Palpations: Abdomen is soft.     Tenderness: There is abdominal tenderness. There is no guarding or rebound.    Musculoskeletal: Normal range of motion.     Comments: Moves all extremities well.   Skin:    General: Skin is warm and dry.     Coloration: Skin is not pale.     Findings: No erythema or rash.  Neurological:     General: No focal deficit present.     Mental Status: She is alert and oriented to person, place, and time.     Cranial Nerves: No cranial nerve deficit.  Psychiatric:        Mood and Affect: Mood normal. Mood is not anxious.        Speech: Speech normal.        Behavior: Behavior normal.        Thought Content: Thought content normal.      ED Treatments / Results  Labs (all labs ordered are listed, but only abnormal results are displayed) Results for orders placed or performed during the hospital encounter of 02/09/19  Comprehensive metabolic panel  Result Value Ref Range   Sodium 138 135 - 145 mmol/L   Potassium 3.8 3.5 - 5.1 mmol/L   Chloride 105 98 - 111 mmol/L   CO2 26 22 - 32 mmol/L   Glucose, Bld 101 (H) 70 - 99 mg/dL   BUN 14 6 - 20 mg/dL   Creatinine, Ser 0.79 0.44 - 1.00 mg/dL   Calcium 8.4 (L) 8.9 - 10.3 mg/dL   Total Protein 7.2  6.5 - 8.1 g/dL   Albumin 4.4 3.5 - 5.0 g/dL   AST 18 15 - 41 U/L   ALT 18 0 - 44 U/L   Alkaline Phosphatase 62 38 - 126 U/L   Total Bilirubin 0.6 0.3 - 1.2 mg/dL   GFR calc non Af Amer >60 >60 mL/min   GFR calc Af Amer >60 >60 mL/min   Anion gap 7 5 - 15  Lipase, blood  Result Value Ref Range   Lipase 33 11 - 51 U/L  CBC with Differential  Result Value Ref Range   WBC 6.3 4.0 - 10.5 K/uL   RBC 4.19 3.87 - 5.11 MIL/uL   Hemoglobin 13.3 12.0 - 15.0 g/dL   HCT 41.4 36.0 - 46.0 %   MCV 98.8 80.0 - 100.0 fL   MCH 31.7 26.0 - 34.0 pg   MCHC 32.1 30.0 - 36.0 g/dL   RDW 11.7 11.5 - 15.5 %   Platelets 188 150 - 400 K/uL   nRBC 0.0 0.0 - 0.2 %   Neutrophils Relative % 54 %   Neutro Abs 3.4 1.7 - 7.7 K/uL   Lymphocytes Relative 38 %   Lymphs Abs 2.4 0.7 - 4.0 K/uL   Monocytes Relative 6 %   Monocytes Absolute 0.4 0.1 - 1.0 K/uL   Eosinophils Relative 2 %   Eosinophils Absolute 0.1 0.0 - 0.5 K/uL   Basophils Relative 0 %  Basophils Absolute 0.0 0.0 - 0.1 K/uL   Immature Granulocytes 0 %   Abs Immature Granulocytes 0.02 0.00 - 0.07 K/uL  Urinalysis, Routine w reflex microscopic  Result Value Ref Range   Color, Urine YELLOW YELLOW   APPearance CLEAR CLEAR   Specific Gravity, Urine 1.011 1.005 - 1.030   pH 5.0 5.0 - 8.0   Glucose, UA NEGATIVE NEGATIVE mg/dL   Hgb urine dipstick LARGE (A) NEGATIVE   Bilirubin Urine NEGATIVE NEGATIVE   Ketones, ur NEGATIVE NEGATIVE mg/dL   Protein, ur NEGATIVE NEGATIVE mg/dL   Nitrite NEGATIVE NEGATIVE   Leukocytes,Ua NEGATIVE NEGATIVE   RBC / HPF 11-20 0 - 5 RBC/hpf   WBC, UA 0-5 0 - 5 WBC/hpf   Bacteria, UA RARE (A) NONE SEEN   Squamous Epithelial / LPF 0-5 0 - 5   Laboratory interpretation all normal except expected hematuria    EKG None  Radiology Ct Abdomen Pelvis W Contrast  Result Date: 02/10/2019 CLINICAL DATA:  Abdominal pain. Appendicitis. EXAM: CT ABDOMEN AND PELVIS WITH CONTRAST TECHNIQUE: Multidetector CT imaging of the  abdomen and pelvis was performed using the standard protocol following bolus administration of intravenous contrast. CONTRAST:  152mL OMNIPAQUE IOHEXOL 300 MG/ML  SOLN COMPARISON:  CT dated July 28, 2017. FINDINGS: Lower chest: The lung bases are clear. The heart size is normal. Hepatobiliary: Again noted are multiple hemangiomas in the liver. Status post cholecystectomy.There is no biliary ductal dilation. Pancreas: Normal contours without ductal dilatation. No peripancreatic fluid collection. Spleen: No splenic laceration or hematoma. Adrenals/Urinary Tract: --Adrenal glands: No adrenal hemorrhage. --Right kidney/ureter: No hydronephrosis or perinephric hematoma. --Left kidney/ureter: No hydronephrosis or perinephric hematoma. --Urinary bladder: Unremarkable. Stomach/Bowel: --Stomach/Duodenum: No hiatal hernia or other gastric abnormality. Normal duodenal course and caliber. --Small bowel: No dilatation or inflammation. --Colon: Rectosigmoid diverticulosis without acute inflammation. --Appendix: Normal. Vascular/Lymphatic: Normal course and caliber of the major abdominal vessels. --No retroperitoneal lymphadenopathy. --No mesenteric lymphadenopathy. --No pelvic or inguinal lymphadenopathy. Reproductive: The patient is status post bilateral tubal ligation. Other: No ascites or free air. The abdominal wall is normal. Musculoskeletal. No acute displaced fractures. IMPRESSION: 1. No acute abdominopelvic abnormality. Normal appendix. 2. Rectosigmoid diverticulosis without acute inflammation. Electronically Signed   By: Constance Holster M.D.   On: 02/10/2019 01:03    Procedures Procedures (including critical care time)  Medications Ordered in ED Medications  ketorolac (TORADOL) 30 MG/ML injection 30 mg (has no administration in time range)  fentaNYL (SUBLIMAZE) injection 50 mcg (50 mcg Intravenous Given 02/10/19 0006)  ondansetron (ZOFRAN) injection 4 mg (4 mg Intravenous Given 02/10/19 0005)  iohexol  (OMNIPAQUE) 300 MG/ML solution 100 mL (100 mLs Intravenous Contrast Given 02/10/19 0043)     Initial Impression / Assessment and Plan / ED Course  I have reviewed the triage vital signs and the nursing notes.  Pertinent labs & imaging results that were available during my care of the patient were reviewed by me and considered in my medical decision making (see chart for details).       Patient was given IV pain and nausea medications.  CT scan of the abdomen and pelvis was done to evaluate her abdominal pain.  Ultrasound is not available until much later this morning.  Recheck at 1:40 AM patient states her pain is better but still has some mild discomfort.  She was given IV Toradol.  We discussed her test results which were normal.  She is agreeable to doing outpatient pelvic ultrasound, they will not be available  here until after 8 AM.  Final Clinical Impressions(s) / ED Diagnoses   Final diagnoses:  Right lower quadrant abdominal pain    ED Discharge Orders         Ordered    US PELVIC COMPLETE WITH TRANSVAGINAL     02/10/19 0209    ondansetron (ZOFRAN) 4 MG tablet  Every 8 hours PRN     02/10/19 0210    naproxen (NAPROSYN) 500 MG tablet  2 times daily with meals     02/10/19 0210          Plan discharge  Rolland Porter, MD, Barbette Or, MD 02/10/19 [phone]

## 2019-02-10 NOTE — Discharge Instructions (Addendum)
Drink plenty of fluids.  Take the naproxen twice a day as needed for pain with food.  Use the Zofran if needed for nausea or vomiting.  Please call [phone] to get an appointment to come back for a pelvic ultrasound.  Return to the emergency department if you get fever, uncontrolled vomiting, or your pain gets worse.

## 2019-04-22 ENCOUNTER — Other Ambulatory Visit: Payer: Self-pay

## 2019-04-22 ENCOUNTER — Encounter: Payer: Self-pay | Admitting: Family Medicine

## 2019-04-22 ENCOUNTER — Ambulatory Visit (INDEPENDENT_AMBULATORY_CARE_PROVIDER_SITE_OTHER): Payer: 59 | Admitting: Family Medicine

## 2019-04-22 VITALS — BP 124/76 | HR 68 | Temp 97.0°F | Resp 14 | Ht 64.0 in | Wt 196.0 lb

## 2019-04-22 DIAGNOSIS — Z7689 Persons encountering health services in other specified circumstances: Secondary | ICD-10-CM

## 2019-04-22 DIAGNOSIS — Z720 Tobacco use: Secondary | ICD-10-CM | POA: Diagnosis not present

## 2019-04-22 DIAGNOSIS — Z13220 Encounter for screening for lipoid disorders: Secondary | ICD-10-CM

## 2019-04-22 DIAGNOSIS — Z0000 Encounter for general adult medical examination without abnormal findings: Secondary | ICD-10-CM

## 2019-04-22 DIAGNOSIS — Z1231 Encounter for screening mammogram for malignant neoplasm of breast: Secondary | ICD-10-CM

## 2019-04-22 DIAGNOSIS — Z23 Encounter for immunization: Secondary | ICD-10-CM | POA: Diagnosis not present

## 2019-04-22 MED ORDER — ALBUTEROL SULFATE HFA 108 (90 BASE) MCG/ACT IN AERS: 2.0000 | INHALATION_SPRAY | Freq: Four times a day (QID) | RESPIRATORY_TRACT | 1 refills | Status: DC | PRN

## 2019-04-22 NOTE — Addendum Note (Signed)
Addended by: Shary Decamp B on: 04/22/2019 12:22 PM   Modules accepted: Orders

## 2019-04-22 NOTE — Progress Notes (Signed)
Subjective:    Patient ID: Debbie Young, female    DOB: 09-12-1970, 48 y.o.   MRN: [id number]  HPI Patient is a very pleasant 48 year old Caucasian female here today to establish care.  Past medical history is significant for frequent recurrent diverticulitis.  In fact she was recently treated for this in September.  She also has a history of an abnormal Pap smear requiring LEEP procedure.  As result, the patient suffered several miscarriages afterwards.  She believes that she was unable to get pregnant.  However 2 years ago, she had an unintended pregnancy which was a blessing.  Therefore she has a 19 year old son as well as a 20-year-old daughter.  She also smokes a pack of cigarettes a day.  Past medical history is significant for nocturnal seizures as a child but she has not had a seizure since she was 48 years old and has not been on any medication since that time.  She also has a history of asthma as a child which she grew out of.  She gets her Pap smears performed at her gynecologist.  She is due for a mammogram now.  She gets this annually due to history of breast cancer in both her mother and her maternal grandmother as well as another maternal family member.  Her mother was screened for BRCA and was told that she was negative for BRCA. Past Medical History:  Diagnosis Date   Anxiety    Asthma    history of asthma as child   Cellulitis 04/2001   LEFT FOOT   Diverticulitis    Hematuria    BENIGN--SAW UROLOGIST   History of abnormal cervical Pap smear 04/30/2015   LGSIL (low grade squamous intraepithelial dysplasia) 1997   Miscarriage 08/02/2013   Patient desires pregnancy 11/18/2013   Seizure disorder ([place])    AS A CHILD--NO MEDS SINCE 16 YO   Smoker    Vaginal Pap smear, abnormal    Past Surgical History:  Procedure Laterality Date   BUNIONECTOMY  1988, 1990   CERVICAL BIOPSY  W/ LOOP ELECTRODE EXCISION  07/2002   CESAREAN SECTION     CESAREAN SECTION WITH  BILATERAL TUBAL LIGATION Bilateral 03/09/2017   Procedure: REPEAT CESAREAN SECTION WITH BILATERAL TUBAL LIGATION;  Surgeon: Jonnie Kind, MD;  Location: [place];  Service: Obstetrics;  Laterality: Bilateral;   CHOLECYSTECTOMY     COLPOSCOPY     KNEE ARTHROSCOPY Right 04/26/2018   Procedure: ARTHROSCOPY KNEE with removal of ganglion of Anterior Cruciate Ligament;  Surgeon: Carole Civil, MD;  Location: [facility];  Service: Orthopedics;  Laterality: Right;   REFRACTIVE SURGERY  2000   TONSILLECTOMY AND ADENOIDECTOMY     No current outpatient medications on file prior to visit.   No current facility-administered medications on file prior to visit.    Allergies  Allergen Reactions   Prednisone Other (See Comments)    Paranoia   Social History   Socioeconomic History   Marital status: Married    Spouse name: Not on file   Number of children: Not on file   Years of education: Not on file   Highest education level: Not on file  Occupational History   Not on file  Social Needs   Financial resource strain: Not on file   Food insecurity    Worry: Not on file    Inability: Not on file   Transportation needs    Medical: Not on file    Non-medical: Not on  file  Tobacco Use   Smoking status: Current Every Day Smoker    Packs/day: 0.50    Years: 20.00    Pack years: 10.00    Types: Cigarettes   Smokeless tobacco: Never Used  Substance and Sexual Activity   Alcohol use: Yes    Comment: social drink   Drug use: No   Sexual activity: Yes    Birth control/protection: Surgical    Comment: tubal  Lifestyle   Physical activity    Days per week: Not on file    Minutes per session: Not on file   Stress: Not on file  Relationships   Social connections    Talks on phone: Not on file    Gets together: Not on file    Attends religious service: Not on file    Active member of club or organization: Not on file    Attends meetings of clubs or  organizations: Not on file    Relationship status: Not on file   Intimate partner violence    Fear of current or ex partner: Not on file    Emotionally abused: Not on file    Physically abused: Not on file    Forced sexual activity: Not on file  Other Topics Concern   Not on file  Social History Narrative   Not on file      Review of Systems  All other systems reviewed and are negative.      Objective:   Physical Exam Vitals signs reviewed.  Constitutional:      General: She is not in acute distress.    Appearance: Normal appearance. She is normal weight. She is not ill-appearing, toxic-appearing or diaphoretic.  HENT:     Head: Normocephalic and atraumatic.     Right Ear: Tympanic membrane and ear canal normal. There is no impacted cerumen.     Left Ear: Tympanic membrane and ear canal normal. There is no impacted cerumen.     Nose: Nose normal.     Mouth/Throat:     Mouth: Mucous membranes are moist.     Pharynx: No oropharyngeal exudate or posterior oropharyngeal erythema.  Eyes:     General: No scleral icterus.       Right eye: No discharge.        Left eye: No discharge.     Extraocular Movements: Extraocular movements intact.     Conjunctiva/sclera: Conjunctivae normal.     Pupils: Pupils are equal, round, and reactive to light.  Neck:     Musculoskeletal: Normal range of motion and neck supple. No neck rigidity or muscular tenderness.     Vascular: No carotid bruit.  Cardiovascular:     Rate and Rhythm: Normal rate and regular rhythm.     Pulses: Normal pulses.     Heart sounds: Normal heart sounds. No murmur. No friction rub. No gallop.   Pulmonary:     Effort: Pulmonary effort is normal. No respiratory distress.     Breath sounds: Normal breath sounds. No stridor. No wheezing, rhonchi or rales.  Chest:     Chest wall: No tenderness.  Abdominal:     General: Abdomen is flat. Bowel sounds are normal. There is no distension.     Palpations: Abdomen is  soft. There is no mass.     Tenderness: There is no abdominal tenderness. There is no guarding or rebound.     Hernia: No hernia is present.  Musculoskeletal: Normal range of motion.  General: No swelling, tenderness, deformity or signs of injury.     Right lower leg: No edema.     Left lower leg: No edema.  Lymphadenopathy:     Cervical: No cervical adenopathy.  Skin:    General: Skin is warm.     Coloration: Skin is not jaundiced or pale.     Findings: No bruising, erythema, lesion or rash.  Neurological:     General: No focal deficit present.     Mental Status: She is alert and oriented to person, place, and time. Mental status is at baseline.     Cranial Nerves: No cranial nerve deficit.     Sensory: No sensory deficit.     Motor: No weakness.     Coordination: Coordination normal.     Gait: Gait normal.     Deep Tendon Reflexes: Reflexes normal.  Psychiatric:        Mood and Affect: Mood normal.        Behavior: Behavior normal.        Thought Content: Thought content normal.        Judgment: Judgment normal.           Assessment & Plan:  Encounter to establish care with new doctor  Tobacco use  Encounter for screening mammogram for malignant neoplasm of breast - Plan: MM Digital Screening  Screening cholesterol level - Plan: Lipid Panel  Physical exam today is normal.  My biggest concern for the patient is her tobacco abuse.  I recommended smoking cessation we did discuss Chantix but at the present time the patient is precontemplative.  She also received her flu shot today.  I will also schedule the patient for mammogram as this is due.  I will screen the patient for hyperlipidemia by checking a fasting lipid panel.  I reviewed the CBC and CMP she recently had the emergency room that were normal.  Her Pap smear was performed at her gynecologist.  The remainder of her physical exam today is normal.

## 2019-04-23 LAB — LIPID PANEL
Cholesterol: 171 mg/dL (ref <200)
HDL: 57 mg/dL (ref >=50)
LDL Cholesterol (Calc): 88 mg/dL (calc)
Non-HDL Cholesterol (Calc): 114 mg/dL (calc) (ref <130)
Total CHOL/HDL Ratio: 3 (calc) (ref <5.0)
Triglycerides: 162 mg/dL — ABNORMAL HIGH (ref <150)

## 2019-05-08 ENCOUNTER — Encounter: Payer: Self-pay | Admitting: Family Medicine

## 2019-07-01 ENCOUNTER — Ambulatory Visit (HOSPITAL_COMMUNITY)
Admission: RE | Admit: 2019-07-01 | Discharge: 2019-07-01 | Disposition: A | Discharge status: Home / Self Care | Payer: 59 | Source: Ambulatory Visit | Attending: Family Medicine | Admitting: Family Medicine

## 2019-07-01 ENCOUNTER — Other Ambulatory Visit: Payer: Self-pay

## 2019-07-01 DIAGNOSIS — Z1231 Encounter for screening mammogram for malignant neoplasm of breast: Secondary | ICD-10-CM | POA: Insufficient documentation

## 2019-09-06 ENCOUNTER — Encounter: Payer: Self-pay | Admitting: Family Medicine

## 2019-09-25 ENCOUNTER — Encounter: Payer: Self-pay | Admitting: Family Medicine

## 2019-09-25 ENCOUNTER — Ambulatory Visit: Payer: 59 | Admitting: Family Medicine

## 2019-09-25 ENCOUNTER — Other Ambulatory Visit: Payer: Self-pay

## 2019-09-25 VITALS — BP 110/68 | HR 67 | Temp 97.9°F | Resp 18 | Wt 201.6 lb

## 2019-09-25 DIAGNOSIS — K5792 Diverticulitis of intestine, part unspecified, without perforation or abscess without bleeding: Secondary | ICD-10-CM

## 2019-09-25 MED ORDER — METRONIDAZOLE 500 MG PO TABS: 500.0000 mg | ORAL_TABLET | Freq: Two times a day (BID) | ORAL | 0 refills | Status: DC

## 2019-09-25 MED ORDER — CIPROFLOXACIN HCL 500 MG PO TABS: 500.0000 mg | ORAL_TABLET | Freq: Two times a day (BID) | ORAL | 0 refills | Status: DC

## 2019-09-25 NOTE — Progress Notes (Signed)
   Subjective:    Patient ID: Debbie Young, female    DOB: [birth year], 49 y.o.   MRN: [id number]  Patient presents for Diverticulitis (started 2019, R side pain, laying down makes it feel better, comes and goes, started again on Sunday 05/02)   Pt here with right sided abd pain, she has history of divertuculosis   She took a few left over antibiotics the last time she had a flare she believes it was Bactrim.  He has had little ones that I come and go but she changes her diet for the seeds and nuts goes on soft food she typically can control it at home without having coming in.  He does recall that she had 16 squats which have some seeds in it the other night and she had a few peanut M&Ms that she is not sure if this caused the symptoms to flareup.  She did get more constipated when she has her flares.  She has not had any fever nausea or vomiting  she tries to watch   Her colonoscopy was performed at the cardiophrenic [facility] he was told that she has diverticular disease throughout the colon.  CT scan from couple years ago showed rectal and sigmoid diverticulosis    Review Of Systems:  GEN- denies fatigue, fever, weight loss,weakness, recent illness HEENT- denies eye drainage, change in vision, nasal discharge, CVS- denies chest pain, palpitations RESP- denies SOB, cough, wheeze ABD- denies N/V, change in stools, abd pain GU- denies dysuria, hematuria, dribbling, incontinence MSK- denies joint pain, muscle aches, injury Neuro- denies headache, dizziness, syncope, seizure activity       Objective:    BP 110/68 (BP Location: Right Arm, Patient Position: Sitting, Cuff Size: Normal)   Pulse 67   Temp 97.9 F (36.6 C) (Temporal)   Resp 18   Wt 201 lb 9.6 oz (91.4 kg)   LMP 09/12/2019   SpO2 97%   BMI 34.60 kg/m  GEN- NAD, alert and oriented x3 HEENT- PERRL, EOMI, non injected sclera, pink conjunctiva, MMM, oropharynx clear CVS- RRR, no murmur RESP-CTAB ABD-NABS,soft,  palpation right lower quadrant, no rebound no guarding no mass palpated, no CVA tenderness EXT- No edema Pulses- Radial, 2+        Assessment & Plan:      Problem List Items Addressed This Visit      Unprioritized   Diverticulitis - Primary    Patient history we will treat for presumptive diverticulitis.  Start Cipro and Flagyl for 7 days as it is a milder case.  Recommend clear fluids for 24 hours and progress to soft foods and regular diet.  Continue to avoid seeds and nuts.  We will check CBC metabolic panel.  She does not have an acute abdomen.  I did give her longer on the prescription in case we do need to extend her antibiotics      Relevant Orders   CBC with Differential/Platelet   Comprehensive metabolic panel      Note: This dictation was prepared with Dragon dictation along with smaller phrase technology. Any transcriptional errors that result from this process are unintentional.

## 2019-09-25 NOTE — Assessment & Plan Note (Signed)
Patient history we will treat for presumptive diverticulitis.  Start Cipro and Flagyl for 7 days as it is a milder case.  Recommend clear fluids for 24 hours and progress to soft foods and regular diet.  Continue to avoid seeds and nuts.  We will check CBC metabolic panel.  She does not have an acute abdomen.  I did give her longer on the prescription in case we do need to extend her antibiotics

## 2019-09-25 NOTE — Patient Instructions (Signed)
Take antibiotics for 7 days  Clear liquids for 24 hours, then try to advance your diet We will call with lab results  F/U as needed

## 2019-09-26 LAB — COMPREHENSIVE METABOLIC PANEL
AG Ratio: 1.9 (calc) (ref 1.0–2.5)
ALT: 26 U/L (ref 6–29)
AST: 24 U/L (ref 10–35)
Albumin: 4.4 g/dL (ref 3.6–5.1)
Alkaline phosphatase (APISO): 59 U/L (ref 31–125)
BUN: 10 mg/dL (ref 7–25)
CO2: 24 mmol/L (ref 20–32)
Calcium: 8.8 mg/dL (ref 8.6–10.2)
Chloride: 103 mmol/L (ref 98–110)
Creat: 0.99 mg/dL (ref 0.50–1.10)
Globulin: 2.3 g/dL (calc) (ref 1.9–3.7)
Glucose, Bld: 80 mg/dL (ref 65–99)
Potassium: 4.3 mmol/L (ref 3.5–5.3)
Sodium: 137 mmol/L (ref 135–146)
Total Bilirubin: 0.9 mg/dL (ref 0.2–1.2)
Total Protein: 6.7 g/dL (ref 6.1–8.1)

## 2019-09-26 LAB — CBC WITH DIFFERENTIAL/PLATELET
Absolute Monocytes: 441 cells/uL (ref 200–950)
Basophils Absolute: 20 cells/uL (ref 0–200)
Basophils Relative: 0.4 %
Eosinophils Absolute: 88 cells/uL (ref 15–500)
Eosinophils Relative: 1.8 %
HCT: 38.3 % (ref 35.0–45.0)
Hemoglobin: 12.8 g/dL (ref 11.7–15.5)
Lymphs Abs: 2205 cells/uL (ref 850–3900)
MCH: 31.6 pg (ref 27.0–33.0)
MCHC: 33.4 g/dL (ref 32.0–36.0)
MCV: 94.6 fL (ref 80.0–100.0)
MPV: 11.1 fL (ref 7.5–12.5)
Monocytes Relative: 9 %
Neutro Abs: 2146 cells/uL (ref 1500–7800)
Neutrophils Relative %: 43.8 %
Platelets: 197 10*3/uL (ref 140–400)
RBC: 4.05 10*6/uL (ref 3.80–5.10)
RDW: 11.5 % (ref 11.0–15.0)
Total Lymphocyte: 45 %
WBC: 4.9 10*3/uL (ref 3.8–10.8)

## 2020-01-13 ENCOUNTER — Ambulatory Visit: Payer: 59 | Admitting: Family Medicine

## 2020-01-13 ENCOUNTER — Other Ambulatory Visit: Payer: Self-pay

## 2020-01-13 VITALS — BP 110/80 | HR 67 | Temp 95.1°F | Ht 64.0 in | Wt 185.0 lb

## 2020-01-13 DIAGNOSIS — R1310 Dysphagia, unspecified: Secondary | ICD-10-CM | POA: Diagnosis not present

## 2020-01-13 MED ORDER — PANTOPRAZOLE SODIUM 40 MG PO TBEC: 40.0000 mg | DELAYED_RELEASE_TABLET | Freq: Every day | ORAL | 3 refills | Status: DC

## 2020-01-13 NOTE — Progress Notes (Signed)
Subjective:    Patient ID: Debbie Young, female    DOB: 1971/01/16, 49 y.o.   MRN: [id number]  HPI Patient states for the last 2 months, she has been dealing with epigastric abdominal pain.  The pain is located just below the xiphoid process.  It only occurs when she eats eggs or avocados.  Within an hour of eating eggs, she will develop a cramp-like intense pain right below the xiphoid process.  She will also feel nauseated and bloating.  It also occurs when she eats avocados.  Seems to be triggered by fatty foods.  Has a history of a cholecystectomy.  She does not drink heavily and has no history of pancreatitis.  CT scan September of last year showed no significant abnormalities in the abdomen.  She has been taking some of her husband's omeprazole 20 mg daily and this seems to be helping her symptoms.  She does have indigestion.  She also reports constipation that could be triggered by stress.  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  However on her days off, she is able to go to the bathroom readily without any difficulty.  She denies any melena or hematochezia.  She denies any fevers or chills.   Past Medical History:  Diagnosis Date  . Anxiety   . Asthma    history of asthma as child  . Cellulitis 04/2001   LEFT FOOT  . Diverticulitis   . Hematuria    BENIGN--SAW UROLOGIST  . History of abnormal cervical Pap smear 04/30/2015  . LGSIL (low grade squamous intraepithelial dysplasia) 1997  . Miscarriage 08/02/2013  . Patient desires pregnancy 11/18/2013  . Seizure disorder ([place])    AS A CHILD--NO MEDS SINCE 25 YO  . Smoker   . Tobacco use   . Vaginal Pap smear, abnormal    Past Surgical History:  Procedure Laterality Date  . [place]  . CERVICAL BIOPSY  W/ LOOP ELECTRODE EXCISION  07/2002  . CESAREAN SECTION    . CESAREAN SECTION WITH BILATERAL TUBAL LIGATION Bilateral 03/09/2017    Procedure: REPEAT CESAREAN SECTION WITH BILATERAL TUBAL LIGATION;  Surgeon: Jonnie Kind, MD;  Location: [facility];  Service: Obstetrics;  Laterality: Bilateral;  . CHOLECYSTECTOMY    . COLPOSCOPY    . KNEE ARTHROSCOPY Right 04/26/2018   Procedure: ARTHROSCOPY KNEE with removal of ganglion of Anterior Cruciate Ligament;  Surgeon: Carole Civil, MD;  Location: [facility];  Service: Orthopedics;  Laterality: Right;  . REFRACTIVE SURGERY  2000  . TONSILLECTOMY AND ADENOIDECTOMY     Current Outpatient Medications on File Prior to Visit  Medication Sig Dispense Refill  . albuterol (VENTOLIN HFA) 108 (90 Base) MCG/ACT inhaler Inhale 2 puffs into the lungs every 6 (six) hours as needed for wheezing or shortness of breath. 18 g 1   No current facility-administered medications on file prior to visit.   Allergies  Allergen Reactions  . Prednisone Other (See Comments)    Paranoia   Social History   Socioeconomic History  . Marital status: Married    Spouse name: Not on file  . Number of children: Not on file  . Years of education: Not on file  . Highest education level: Not on file  Occupational History  . Not on file  Tobacco Use  . Smoking status: Current Every Day Smoker    Packs/day:  0.50    Years: 20.00    Pack years: 10.00    Types: Cigarettes  . Smokeless tobacco: Never Used  Vaping Use  . Vaping Use: Never used  Substance and Sexual Activity  . Alcohol use: Yes    Comment: social drink  . Drug use: No  . Sexual activity: Yes    Birth control/protection: Surgical    Comment: tubal  Other Topics Concern  . Not on file  Social History Narrative  . Not on file   Social Determinants of Health   Financial Resource Strain:   . Difficulty of Paying Living Expenses: Not on file  Food Insecurity:   . Worried About Charity fundraiser in the Last Year: Not on file  . Ran Out of Food in the Last Year: Not on file  Transportation Needs:   . Lack of  Transportation (Medical): Not on file  . Lack of Transportation (Non-Medical): Not on file  Physical Activity:   . Days of Exercise per Week: Not on file  . Minutes of Exercise per Session: Not on file  Stress:   . Feeling of Stress : Not on file  Social Connections:   . Frequency of Communication with Friends and Family: Not on file  . Frequency of Social Gatherings with Friends and Family: Not on file  . Attends Religious Services: Not on file  . Active Member of Clubs or Organizations: Not on file  . Attends Archivist Meetings: Not on file  . Marital Status: Not on file  Intimate Partner Violence:   . Fear of Current or Ex-Partner: Not on file  . Emotionally Abused: Not on file  . Physically Abused: Not on file  . Sexually Abused: Not on file      Review of Systems  All other systems reviewed and are negative.      Objective:   Physical Exam Vitals reviewed.  Constitutional:      Appearance: Normal appearance. She is normal weight.  Cardiovascular:     Rate and Rhythm: Normal rate and regular rhythm.     Heart sounds: Normal heart sounds.  Pulmonary:     Effort: Pulmonary effort is normal.     Breath sounds: Normal breath sounds.  Abdominal:     General: Bowel sounds are normal. There is no distension.     Palpations: Abdomen is soft.     Tenderness: There is no abdominal tenderness. There is no guarding.  Neurological:     Mental Status: She is alert.           Assessment & Plan:  Dysphagia, unspecified type - Plan: H. pylori breath test  The majority of our encounter was spent in discussion.  Symptoms are unusual.  Differential diagnosis includes gastritis that is exacerbated by fatty foods, irritable bowel syndrome with intestinal spasms, some type of gastric outlet obstruction which may be exacerbated by certain foods.  Recommend trying Protonix 40 mg a day to treat possible gastritis and then challenge with foods again in 3 weeks.  Check H.  pylori breath test for possible gastritis or peptic ulcer disease.  If this does not help, I will consider trying antispasmodics such as peppermint oil.  And if no better at that point recommend EGD.

## 2020-01-14 LAB — H. PYLORI BREATH TEST: H. pylori Breath Test: NOT DETECTED

## 2020-03-31 ENCOUNTER — Encounter: Payer: Self-pay | Admitting: Family Medicine

## 2020-03-31 ENCOUNTER — Ambulatory Visit: Payer: 59 | Admitting: Family Medicine

## 2020-03-31 ENCOUNTER — Other Ambulatory Visit: Payer: Self-pay

## 2020-03-31 VITALS — BP 112/68 | HR 80 | Temp 97.9°F | Resp 14 | Ht 64.0 in | Wt 190.0 lb

## 2020-03-31 DIAGNOSIS — J0100 Acute maxillary sinusitis, unspecified: Secondary | ICD-10-CM

## 2020-03-31 DIAGNOSIS — J029 Acute pharyngitis, unspecified: Secondary | ICD-10-CM | POA: Diagnosis not present

## 2020-03-31 MED ORDER — AMOXICILLIN 875 MG PO TABS: 875.0000 mg | ORAL_TABLET | Freq: Two times a day (BID) | ORAL | 0 refills | Status: DC

## 2020-03-31 NOTE — Progress Notes (Signed)
   Subjective:    Patient ID: Debbie Young, female    DOB: [birth year], 49 y.o.   MRN: [id number]  Patient presents for Illness (cough, sinus pressure, sore throat had COVID end of Sept)   Pt here with cough with production, sinus pressure drainage  Sept 20th - she had  COVID-19, she has been vaccinated She improved and had mild symptoms, then last week her family had gastroenteritis she had diarrhea and nausea, that has now resolved Over the weekend she started with  started with sore throat that worsened, feels like her lymph nodes Sunday night ear pain , she has some sinus pressure/drainage  Now intermittant cough with drainage  No fever  No wheeze, has not needed albuterol  She took dayquil Sunday night   Has not had flu shot      Review Of Systems:  GEN- denies fatigue, fever, weight loss,weakness, recent illness HEENT- denies eye drainage, change in vision,+ nasal discharge, CVS- denies chest pain, palpitations RESP- denies SOB, +cough, wheeze ABD- denies N/V, change in stools, abd pain GU- denies dysuria, hematuria, dribbling, incontinence MSK- denies joint pain, muscle aches, injury Neuro- denies headache, dizziness, syncope, seizure activity       Objective:    BP 112/68   Pulse 80   Temp 97.9 F (36.6 C) (Temporal)   Resp 14   Ht 5\' 4"  (1.626 m)   Wt 190 lb (86.2 kg)   SpO2 96%   BMI 32.61 kg/m  GEN- NAD, alert and oriented x3 HEENT- PERRL, EOMI, non injected sclera, pink conjunctiva, MMM, oropharynx mild injection, TM clear bilat no effusion,  + maxillary sinus tenderness, inflammed turbinates,  Nasal drainage  Neck- Supple, no LAD CVS- RRR, no murmur RESP-CTAB EXT- No edema Pulses- Radial 2+          Assessment & Plan:      Problem List Items Addressed This Visit    None    Visit Diagnoses    Sore throat    -  Primary   Strep negative, sore throat due to post nasal drip    Relevant Orders   STREP GROUP A AG, W/REFLEX TO CULT  (Completed)   Acute non-recurrent maxillary sinusitis       Sinusitis 2nd illness since COVID-19 which was less than 6 weeks away, does not need to be rechecked for this today, treat amoxicillin nasal spray, anti-histami   Relevant Medications   amoxicillin (AMOXIL) 875 MG tablet      Note: This dictation was prepared with Dragon dictation along with smaller phrase technology. Any transcriptional errors that result from this process are unintentional.

## 2020-03-31 NOTE — Patient Instructions (Signed)
Take antibiotics Use zyrtec Use Afrin as needed for congestion or flonase F/U as needed

## 2020-04-03 LAB — CULTURE, GROUP A STREP
MICRO NUMBER:: 11179288
SPECIMEN QUALITY:: ADEQUATE

## 2020-04-03 LAB — STREP GROUP A AG, W/REFLEX TO CULT: Streptococcus, Group A Screen (Direct): NOT DETECTED

## 2020-04-14 ENCOUNTER — Encounter: Payer: Self-pay | Admitting: Family Medicine

## 2020-05-07 ENCOUNTER — Other Ambulatory Visit: Payer: Self-pay | Admitting: Family Medicine

## 2020-06-30 ENCOUNTER — Other Ambulatory Visit (HOSPITAL_COMMUNITY): Payer: Self-pay | Admitting: Adult Health

## 2020-06-30 ENCOUNTER — Ambulatory Visit (HOSPITAL_COMMUNITY)
Admission: RE | Admit: 2020-06-30 | Discharge: 2020-06-30 | Disposition: A | Discharge status: Home / Self Care | Payer: 59 | Source: Ambulatory Visit | Attending: Family Medicine | Admitting: Family Medicine

## 2020-06-30 ENCOUNTER — Ambulatory Visit: Payer: 59 | Admitting: Family Medicine

## 2020-06-30 ENCOUNTER — Other Ambulatory Visit: Payer: Self-pay

## 2020-06-30 VITALS — BP 114/62 | HR 50 | Temp 97.4°F | Resp 17 | Ht 64.0 in | Wt 188.0 lb

## 2020-06-30 DIAGNOSIS — R300 Dysuria: Secondary | ICD-10-CM

## 2020-06-30 DIAGNOSIS — R1084 Generalized abdominal pain: Secondary | ICD-10-CM | POA: Diagnosis not present

## 2020-06-30 DIAGNOSIS — Z1231 Encounter for screening mammogram for malignant neoplasm of breast: Secondary | ICD-10-CM

## 2020-06-30 LAB — URINALYSIS, ROUTINE W REFLEX MICROSCOPIC
Bacteria, UA: NONE SEEN /HPF
Bilirubin Urine: NEGATIVE
Glucose, UA: NEGATIVE
Hyaline Cast: NONE SEEN /LPF
Ketones, ur: NEGATIVE
Leukocytes,Ua: NEGATIVE
Nitrite: NEGATIVE
Protein, ur: NEGATIVE
Specific Gravity, Urine: 1.015 (ref 1.001–1.03)
WBC, UA: NONE SEEN /HPF (ref 0–5)
pH: 6.5 (ref 5.0–8.0)

## 2020-06-30 LAB — MICROSCOPIC MESSAGE

## 2020-06-30 MED ORDER — MAGNESIUM CITRATE PO SOLN: 1.0000 | Freq: Once | ORAL | 0 refills | Status: AC

## 2020-06-30 NOTE — Progress Notes (Signed)
Subjective:    Patient ID: Debbie Young, female    DOB: [birth year], 50 y.o.   MRN: [id number]  HPI Patient states she has been dealing with a bladder infection since September.  She will develop pressure and urgency and frequency and dysuria.  She will treated with Azo over-the-counter and then the symptoms will go away only to come back a week or 2 later.  Most recently she developed frequency urgency hesitancy and burning and she started taking Azo.  Then she developed a throbbing pain in her right and left flank.  This started last Tuesday.  She was concerned that she was getting diverticulitis so she took some Cipro and Flagyl that she had at home for diverticulitis.  The burning frequency and urgency have subsided.  Urinalysis today shows +2 blood but is otherwise negative.  She has a history of microscopic hematuria that has been worked up by urologist in the past and found to be idiopathic.  She continues to have the throbbing pain in both flanks.  It does not radiate.  It is not related to movement.  She also reports abdominal fullness.  She states that she has been struggling with constipation.  She is not had a bowel movement in 5 days.  She denies any nausea or vomiting.  She denies any fever.  She denies any vaginal discharge.  She has no CVA tenderness today on exam.  She has mild diffuse abdominal tenderness all throughout.  She has markedly diminished bowel sounds Past Medical History:  Diagnosis Date  . Anxiety   . Asthma    history of asthma as child  . Cellulitis 04/2001   LEFT FOOT  . Diverticulitis   . Hematuria    BENIGN--SAW UROLOGIST  . History of abnormal cervical Pap smear 04/30/2015  . LGSIL (low grade squamous intraepithelial dysplasia) 1997  . Miscarriage 08/02/2013  . Patient desires pregnancy 11/18/2013  . Seizure disorder ([place])    AS A CHILD--NO MEDS SINCE 89 YO  . Smoker   . Tobacco use   . Vaginal Pap smear, abnormal    Past Surgical History:  Procedure  Laterality Date  . [place]  . CERVICAL BIOPSY  W/ LOOP ELECTRODE EXCISION  07/2002  . CESAREAN SECTION    . CESAREAN SECTION WITH BILATERAL TUBAL LIGATION Bilateral 03/09/2017   Procedure: REPEAT CESAREAN SECTION WITH BILATERAL TUBAL LIGATION;  Surgeon: Jonnie Kind, MD;  Location: [facility];  Service: Obstetrics;  Laterality: Bilateral;  . CHOLECYSTECTOMY    . COLPOSCOPY    . KNEE ARTHROSCOPY Right 04/26/2018   Procedure: ARTHROSCOPY KNEE with removal of ganglion of Anterior Cruciate Ligament;  Surgeon: Carole Civil, MD;  Location: [facility];  Service: Orthopedics;  Laterality: Right;  . REFRACTIVE SURGERY  2000  . TONSILLECTOMY AND ADENOIDECTOMY     Current Outpatient Medications on File Prior to Visit  Medication Sig Dispense Refill  . albuterol (VENTOLIN HFA) 108 (90 Base) MCG/ACT inhaler Inhale 2 puffs into the lungs every 6 (six) hours as needed for wheezing or shortness of breath. 18 g 1  . pantoprazole (PROTONIX) 40 MG tablet TAKE 1 TABLET BY MOUTH EVERY DAY 30 tablet 3  . amoxicillin (AMOXIL) 875 MG tablet Take 1 tablet (875 mg total) by mouth 2 (two) times daily. (Patient not taking: Reported on 06/30/2020) 20 tablet 0   No current facility-administered medications on file prior to visit.   Allergies  Allergen Reactions  . Prednisone Other (  See Comments)    Paranoia   Social History   Socioeconomic History  . Marital status: Married    Spouse name: Not on file  . Number of children: Not on file  . Years of education: Not on file  . Highest education level: Not on file  Occupational History  . Not on file  Tobacco Use  . Smoking status: Current Every Day Smoker    Packs/day: 0.50    Years: 20.00    Pack years: 10.00    Types: Cigarettes  . Smokeless tobacco: Never Used  Vaping Use  . Vaping Use: Never used  Substance and Sexual Activity  . Alcohol use: Yes    Comment: social drink  . Drug use: No  . Sexual activity: Yes     Birth control/protection: Surgical    Comment: tubal  Other Topics Concern  . Not on file  Social History Narrative  . Not on file   Social Determinants of Health   Financial Resource Strain: Not on file  Food Insecurity: Not on file  Transportation Needs: Not on file  Physical Activity: Not on file  Stress: Not on file  Social Connections: Not on file  Intimate Partner Violence: Not on file      Review of Systems  Gastrointestinal: Positive for abdominal pain.  All other systems reviewed and are negative.      Objective:   Physical Exam Vitals reviewed.  Constitutional:      Appearance: Normal appearance. She is normal weight.  Cardiovascular:     Rate and Rhythm: Normal rate and regular rhythm.     Heart sounds: Normal heart sounds.  Pulmonary:     Effort: Pulmonary effort is normal.     Breath sounds: Normal breath sounds.  Abdominal:     General: Abdomen is flat. Bowel sounds are decreased. There is no distension.     Palpations: Abdomen is soft. There is no splenomegaly, mass or pulsatile mass.     Tenderness: There is generalized abdominal tenderness. There is no guarding.  Neurological:     Mental Status: She is alert.           Assessment & Plan:  Dysuria - Plan: Urinalysis, Routine w reflex microscopic, CANCELED: Urinalysis Dipstick  Generalized abdominal pain - Plan: DG Abd 2 Views  I believe that she did have a urinary tract infection which she is treated with a week of Cipro and has now subsided.  Now she has throbbing recurrent colicky abdominal pain radiating into her flank bilaterally and severe constipation.  Try magnesium citrate 1 time and get an x-ray to see if she has severe constipation which may be the cause of her abdominal pain.  If the x-ray is negative and if the magnesium citrate does not help, I would recommend a CT scan given her hematuria to evaluate for kidney stone

## 2020-07-01 ENCOUNTER — Encounter: Payer: Self-pay | Admitting: Family Medicine

## 2020-07-02 ENCOUNTER — Other Ambulatory Visit: Payer: Self-pay | Admitting: Family Medicine

## 2020-07-02 DIAGNOSIS — R1031 Right lower quadrant pain: Secondary | ICD-10-CM

## 2020-07-02 DIAGNOSIS — R319 Hematuria, unspecified: Secondary | ICD-10-CM

## 2020-07-13 ENCOUNTER — Ambulatory Visit (HOSPITAL_COMMUNITY): Payer: 59

## 2020-07-20 ENCOUNTER — Other Ambulatory Visit: Payer: Self-pay

## 2020-07-20 ENCOUNTER — Ambulatory Visit
Admission: RE | Admit: 2020-07-20 | Discharge: 2020-07-20 | Disposition: A | Discharge status: Home / Self Care | Payer: 59 | Source: Ambulatory Visit | Attending: Family Medicine | Admitting: Family Medicine

## 2020-07-20 DIAGNOSIS — R1031 Right lower quadrant pain: Secondary | ICD-10-CM

## 2020-07-20 DIAGNOSIS — R319 Hematuria, unspecified: Secondary | ICD-10-CM

## 2020-07-30 ENCOUNTER — Ambulatory Visit (HOSPITAL_COMMUNITY): Payer: 59

## 2020-07-31 ENCOUNTER — Ambulatory Visit (HOSPITAL_COMMUNITY)
Admission: RE | Admit: 2020-07-31 | Discharge: 2020-07-31 | Disposition: A | Discharge status: Home / Self Care | Payer: 59 | Source: Ambulatory Visit | Attending: Adult Health | Admitting: Adult Health

## 2020-07-31 ENCOUNTER — Other Ambulatory Visit: Payer: Self-pay

## 2020-07-31 DIAGNOSIS — Z1231 Encounter for screening mammogram for malignant neoplasm of breast: Secondary | ICD-10-CM | POA: Diagnosis not present

## 2020-09-06 ENCOUNTER — Other Ambulatory Visit: Payer: Self-pay | Admitting: Family Medicine

## 2020-10-15 ENCOUNTER — Other Ambulatory Visit (HOSPITAL_COMMUNITY)
Admission: RE | Admit: 2020-10-15 | Discharge: 2020-10-15 | Disposition: A | Discharge status: Home / Self Care | Payer: 59 | Source: Ambulatory Visit | Attending: Adult Health | Admitting: Adult Health

## 2020-10-15 ENCOUNTER — Other Ambulatory Visit: Payer: Self-pay

## 2020-10-15 ENCOUNTER — Encounter: Payer: Self-pay | Admitting: Adult Health

## 2020-10-15 ENCOUNTER — Ambulatory Visit (INDEPENDENT_AMBULATORY_CARE_PROVIDER_SITE_OTHER): Payer: 59 | Admitting: Adult Health

## 2020-10-15 VITALS — BP 114/77 | HR 62 | Ht 64.0 in | Wt 189.0 lb

## 2020-10-15 DIAGNOSIS — Z01419 Encounter for gynecological examination (general) (routine) without abnormal findings: Secondary | ICD-10-CM

## 2020-10-15 DIAGNOSIS — N951 Menopausal and female climacteric states: Secondary | ICD-10-CM

## 2020-10-15 NOTE — Patient Instructions (Signed)
https://www.womenshealth.gov/menopause/menopause-basics"> https://www.clinicalkey.com">  Menopause Menopause is the normal time of a woman's life when menstrual periods stop completely. It marks the natural end to a woman's ability to become pregnant. It can be defined as the absence of a menstrual period for 12 months without another medical cause. The transition to menopause (perimenopause) most often happens between the ages of 45 and 55, and can last for many years. During perimenopause, hormone levels change in your body, which can cause symptoms and affect your health. Menopause may increase your risk for:  Weakened bones (osteoporosis), which causes fractures.  Depression.  Hardening and narrowing of the arteries (atherosclerosis), which can cause heart attacks and strokes. What are the causes? This condition is usually caused by a natural change in hormone levels that happens as you get older. The condition may also be caused by changes that are not natural, including:  Surgery to remove both ovaries (surgical menopause).  Side effects from some medicines, such as chemotherapy used to treat cancer (chemical menopause). What increases the risk? This condition is more likely to start at an earlier age if you have certain medical conditions or have undergone treatments, including:  A tumor of the pituitary gland in the brain.  A disease that affects the ovaries and hormones.  Certain cancer treatments, such as chemotherapy or hormone therapy, or radiation therapy on the pelvis.  Heavy smoking and excessive alcohol use.  Family history of early menopause. This condition is also more likely to develop earlier in women who are very thin. What are the signs or symptoms? Symptoms of this condition include:  Hot flashes.  Irregular menstrual periods.  Night sweats.  Changes in feelings about sex. This could be a decrease in sex drive or an increased discomfort around your  sexuality.  Vaginal dryness and thinning of the vaginal walls. This may cause painful sex.  Dryness of the skin and development of wrinkles.  Headaches.  Problems sleeping (insomnia).  Mood swings or irritability.  Memory problems.  Weight gain.  Hair growth on the face and chest.  Bladder infections or problems with urinating. How is this diagnosed? This condition is diagnosed based on your medical history, a physical exam, your age, your menstrual history, and your symptoms. Hormone tests may also be done. How is this treated? In some cases, no treatment is needed. You and your health care provider should make a decision together about whether treatment is necessary. Treatment will be based on your individual condition and preferences. Treatment for this condition focuses on managing symptoms. Treatment may include:  Menopausal hormone therapy (MHT).  Medicines to treat specific symptoms or complications.  Acupuncture.  Vitamin or herbal supplements. Before starting treatment, make sure to let your health care provider know if you have a personal or family history of these conditions:  Heart disease.  Breast cancer.  Blood clots.  Diabetes.  Osteoporosis. Follow these instructions at home: Lifestyle  Do not use any products that contain nicotine or tobacco, such as cigarettes, e-cigarettes, and chewing tobacco. If you need help quitting, ask your health care provider.  Get at least 30 minutes of physical activity on 5 or more days each week.  Avoid alcoholic and caffeinated beverages, as well as spicy foods. This may help prevent hot flashes.  Get 7-8 hours of sleep each night.  If you have hot flashes, try: ? Dressing in layers. ? Avoiding things that may trigger hot flashes, such as spicy food, warm places, or stress. ? Taking slow, deep   breaths when a hot flash starts. ? Keeping a fan in your home and office.  Find ways to manage stress, such as deep  breathing, meditation, or journaling.  Consider going to group therapy with other women who are having menopause symptoms. Ask your health care provider about recommended group therapy meetings. Eating and drinking  Eat a healthy, balanced diet that contains whole grains, lean protein, low-fat dairy, and plenty of fruits and vegetables.  Your health care provider may recommend adding more soy to your diet. Foods that contain soy include tofu, tempeh, and soy milk.  Eat plenty of foods that contain calcium and vitamin D for bone health. Items that are rich in calcium include low-fat milk, yogurt, beans, almonds, sardines, broccoli, and kale.   Medicines  Take over-the-counter and prescription medicines only as told by your health care provider.  Talk with your health care provider before starting any herbal supplements. If prescribed, take vitamins and supplements as told by your health care provider. General instructions  Keep track of your menstrual periods, including: ? When they occur. ? How heavy they are and how long they last. ? How much time passes between periods.  Keep track of your symptoms, noting when they start, how often you have them, and how long they last.  Use vaginal lubricants or moisturizers to help with vaginal dryness and improve comfort during sex.  Keep all follow-up visits. This is important. This includes any group therapy or counseling.   Contact a health care provider if:  You are still having menstrual periods after age 55.  You have pain during sex.  You have not had a period for 12 months and you develop vaginal bleeding. Get help right away if you have:  Severe depression.  Excessive vaginal bleeding.  Pain when you urinate.  A fast or irregular heartbeat (palpitations).  Severe headaches.  Abdominal pain or severe indigestion. Summary  Menopause is a normal time of life when menstrual periods stop completely. It is usually defined as  the absence of a menstrual period for 12 months without another medical cause.  The transition to menopause (perimenopause) most often happens between the ages of 45 and 55 and can last for several years.  Symptoms can be managed through medicines, lifestyle changes, and complementary therapies such as acupuncture.  Eat a balanced diet that is rich in nutrients to promote bone health and heart health and to manage symptoms during menopause. This information is not intended to replace advice given to you by your health care provider. Make sure you discuss any questions you have with your health care provider. Document Revised: 02/07/2020 Document Reviewed: 10/24/2019 Elsevier Patient Education  2021 Elsevier Inc.  

## 2020-10-15 NOTE — Progress Notes (Signed)
Patient ID: Debbie Young, female   DOB: Mar 05, 1971, 50 y.o.   MRN: [id number] History of Present Illness: Debbie Young is a 50 year old white female,marriedm [id number] in for a well woman gyn exam and pap.  She works for [employer]. PCP is Dr Dennard Schaumann.   Current Medications, Allergies, Past Medical History, Past Surgical History, Family History and Social History were reviewed in [employer] record.     Review of Systems: Patient denies any headaches, hearing loss, fatigue, blurred vision, shortness of breath, chest pain, abdominal pain, problems with bowel movements, urination, or intercourse.(decreased libido). No joint pain or mood swings. Periods irregular +hot flashes at night Wakes up at night but has 50 yo, too    Physical Exam:BP 114/77 (BP Location: Left Arm, Patient Position: Sitting, Cuff Size: Normal)   Pulse 62   Ht 5\' 4"  (1.626 m)   Wt 189 lb (85.7 kg)   LMP 10/10/2020   BMI 32.44 kg/m  General:  Well developed, well nourished, no acute distress Skin:  Warm and dry Neck:  Midline trachea, normal thyroid, good ROM, no lymphadenopathy Lungs; Clear to auscultation bilaterally Breast:  No dominant palpable mass, retraction, or nipple discharge Cardiovascular: Regular rate and rhythm Abdomen:  Soft, non tender, no hepatosplenomegaly Pelvic:  External genitalia is normal in appearance, no lesions.  The vagina is normal in appearance,+period blood. Urethra has no lesions or masses. The cervix is bulbous. Pap with HR HPV genotyping performed. Uterus is felt to be normal size, shape, and contour.  No adnexal masses or tenderness noted.Bladder is non tender, no masses felt. Rectal:Deferred due to period blood Extremities/musculoskeletal:  No swelling or varicosities noted, no clubbing or cyanosis Psych:  No mood changes, alert and cooperative,seems happy AA is 3 Fall risk is moderate PHQ 9 score is 1 GAD 7 score is 4  [facility] - 10/15/20 1101       Pregnancy Intention Screening   Does the patient want to become pregnant in the next year? No    Does the patient's partner want to become pregnant in the next year? No    Would the patient like to discuss contraceptive options today? No      Contraception Wrap Up   Current Method Female Sterilization    End Method Female Sterilization    Contraception Counseling Provided No         Examination chaperoned by Debbie Pupa LPN  Impression and Plan: 1. Encounter for gynecological examination with Papanicolaou smear of cervix Pap sent Physical in 1 year Pap in 3 if normal Fasting labs next year Mammogram yearly Colonoscopy per GI  2. Peri-menopausal Discussed symptoms  Review handout menopause

## 2020-10-23 LAB — CYTOLOGY - PAP
Comment: NEGATIVE
Diagnosis: NEGATIVE
High risk HPV: NEGATIVE

## 2020-12-16 ENCOUNTER — Encounter: Payer: Self-pay | Admitting: Family Medicine

## 2020-12-16 MED ORDER — EPINEPHRINE 0.3 MG/0.3ML IJ SOAJ: 0.3000 mg | INTRAMUSCULAR | 3 refills | Status: AC | PRN

## 2021-01-13 ENCOUNTER — Other Ambulatory Visit: Payer: Self-pay

## 2021-01-13 ENCOUNTER — Encounter: Payer: Self-pay | Admitting: Emergency Medicine

## 2021-01-13 ENCOUNTER — Ambulatory Visit (INDEPENDENT_AMBULATORY_CARE_PROVIDER_SITE_OTHER): Payer: 59

## 2021-01-13 ENCOUNTER — Ambulatory Visit
Admission: EM | Admit: 2021-01-13 | Discharge: 2021-01-13 | Disposition: A | Discharge status: Home / Self Care | Payer: 59 | Attending: Urgent Care | Admitting: Urgent Care

## 2021-01-13 DIAGNOSIS — M79641 Pain in right hand: Secondary | ICD-10-CM

## 2021-01-13 DIAGNOSIS — M7989 Other specified soft tissue disorders: Secondary | ICD-10-CM

## 2021-01-13 DIAGNOSIS — S60041A Contusion of right ring finger without damage to nail, initial encounter: Secondary | ICD-10-CM

## 2021-01-13 DIAGNOSIS — M79644 Pain in right finger(s): Secondary | ICD-10-CM | POA: Diagnosis not present

## 2021-01-13 MED ORDER — NAPROXEN 375 MG PO TABS: 375.0000 mg | ORAL_TABLET | Freq: Two times a day (BID) | ORAL | 0 refills | Status: DC

## 2021-01-13 NOTE — ED Provider Notes (Signed)
[facility]   MRN: [id number] DOB: July 24, 1970  Subjective:   Debbie Young is a 50 y.o. female presenting for suffering a right ring finger injury yesterday while she was horse playing with her husband.  States that she ended up smacking him with her right hand against his forearm and hurt her finger from this.  Reports that she has had more swelling and bruising into today and wanted to make sure that she did not have a fracture.  No current facility-administered medications for this encounter.  Current Outpatient Medications:    albuterol (VENTOLIN HFA) 108 (90 Base) MCG/ACT inhaler, Inhale 2 puffs into the lungs every 6 (six) hours as needed for wheezing or shortness of breath., Disp: 18 g, Rfl: 1   EPINEPHrine 0.3 mg/0.3 mL IJ SOAJ injection, Inject 0.3 mg into the muscle as needed for anaphylaxis., Disp: 1 each, Rfl: 3   pantoprazole (PROTONIX) 40 MG tablet, TAKE 1 TABLET BY MOUTH EVERY DAY, Disp: 30 tablet, Rfl: 3   Allergies  Allergen Reactions   Prednisone Other (See Comments)    Paranoia    Past Medical History:  Diagnosis Date   Anxiety    Asthma    history of asthma as child   Cellulitis 04/2001   LEFT FOOT   Diverticulitis    Hematuria    BENIGN--SAW UROLOGIST   History of abnormal cervical Pap smear 04/30/2015   LGSIL (low grade squamous intraepithelial dysplasia) 1997   Miscarriage 08/02/2013   Patient desires pregnancy 11/18/2013   Seizure disorder ([place])    AS A CHILD--NO MEDS SINCE 16 YO   Smoker    Tobacco use    Vaginal Pap smear, abnormal      Past Surgical History:  Procedure Laterality Date   BUNIONECTOMY  1988, 1990   CERVICAL BIOPSY  W/ LOOP ELECTRODE EXCISION  07/2002   CESAREAN SECTION     CESAREAN SECTION WITH BILATERAL TUBAL LIGATION Bilateral 03/09/2017   Procedure: REPEAT CESAREAN SECTION WITH BILATERAL TUBAL LIGATION;  Surgeon: Jonnie Kind, MD;  Location: [facility];  Service: Obstetrics;  Laterality: Bilateral;    CHOLECYSTECTOMY     COLPOSCOPY     KNEE ARTHROSCOPY Right 04/26/2018   Procedure: ARTHROSCOPY KNEE with removal of ganglion of Anterior Cruciate Ligament;  Surgeon: Carole Civil, MD;  Location: [facility];  Service: Orthopedics;  Laterality: Right;   REFRACTIVE SURGERY  2000   TONSILLECTOMY AND ADENOIDECTOMY      Family History  Problem Relation Age of Onset   Breast cancer Mother 20   Fibromyalgia Mother    Von Willebrand disease Mother    Stroke Mother    Hyperlipidemia Mother    Hypertension Mother    Breast cancer Maternal Aunt    Breast cancer Maternal Grandmother    Heart disease Maternal Grandmother    Anuerysm Maternal Grandmother    Heart failure Maternal Grandmother    Hypertension Maternal Grandfather    Stroke Maternal Grandfather    Heart disease Paternal Grandmother    Heart disease Paternal Grandfather    Cancer Father        Myloma and [place] water    Asthma Son     Social History   Tobacco Use   Smoking status: Every Day    Packs/day: 1.00    Years: 20.00    Pack years: 20.00    Types: Cigarettes   Smokeless tobacco: Never  Vaping Use   Vaping Use: Former  Substance  Use Topics   Alcohol use: Yes    Comment: social drink   Drug use: No    ROS   Objective:   Vitals: BP 126/84 (BP Location: Left Arm)   Pulse 71   Temp 98.3 F (36.8 C) (Oral)   Ht '5\' 4"'$  (1.626 m)   Wt 190 lb (86.2 kg)   LMP 12/28/2020   SpO2 98%   BMI 32.61 kg/m   Physical Exam Constitutional:      General: She is not in acute distress.    Appearance: Normal appearance. She is well-developed. She is not ill-appearing, toxic-appearing or diaphoretic.  HENT:     Head: Normocephalic and atraumatic.     Nose: Nose normal.     Mouth/Throat:     Mouth: Mucous membranes are moist.     Pharynx: Oropharynx is clear.  Eyes:     General: No scleral icterus.    Extraocular Movements: Extraocular movements intact.     Pupils: Pupils are equal,  round, and reactive to light.  Cardiovascular:     Rate and Rhythm: Normal rate.  Pulmonary:     Effort: Pulmonary effort is normal.  Musculoskeletal:       Hands:  Skin:    General: Skin is warm and dry.  Neurological:     General: No focal deficit present.     Mental Status: She is alert and oriented to person, place, and time.  Psychiatric:        Mood and Affect: Mood normal.        Behavior: Behavior normal.        Thought Content: Thought content normal.        Judgment: Judgment normal.    DG Hand Complete Right  Result Date: 01/13/2021 CLINICAL DATA:  Right fourth finger injury EXAM: RIGHT HAND - COMPLETE 3+ VIEW COMPARISON:  Hand radiographs 08/24/2015 FINDINGS: There is no acute fracture or dislocation. Bony alignment is normal. The joint spaces are preserved. There is soft tissue swelling about the ring finger. IMPRESSION: Soft tissue swelling about the ring finger without underlying osseous abnormality. Electronically Signed   By: Valetta Mole M.D.   On: 01/13/2021 09:37     Assessment and Plan :   PDMP not reviewed this encounter.  1. Finger pain, right   2. Swelling of right ring finger   3. Contusion of right ring finger without damage to nail, initial encounter     Recommended buddy tape system, naproxen for pain and inflammation.  I had previously reviewed the images and did not see an obvious fracture.  Sent patient out with the understanding that if the radiology over read was negative we would not contact her and would only review positive results with her.  Patient was agreeable to this. Counseled patient on potential for adverse effects with medications prescribed/recommended today, ER and return-to-clinic precautions discussed, patient verbalized understanding.    Jaynee Eagles, PA-C 01/13/21 1011

## 2021-01-13 NOTE — ED Triage Notes (Signed)
Patient injured her right 4th finger last night while cooking.  The finger is bruised and swollen.  Patient did take Tylenol last night and apply ice to the finger.

## 2021-01-26 ENCOUNTER — Other Ambulatory Visit: Payer: Self-pay | Admitting: Family Medicine

## 2021-01-29 ENCOUNTER — Encounter: Payer: Self-pay | Admitting: Family Medicine

## 2021-03-04 ENCOUNTER — Encounter: Payer: Self-pay | Admitting: Family Medicine

## 2021-04-05 ENCOUNTER — Encounter: Payer: Self-pay | Admitting: Family Medicine

## 2021-04-06 ENCOUNTER — Other Ambulatory Visit: Payer: Self-pay | Admitting: Family Medicine

## 2021-04-06 MED ORDER — AMOXICILLIN-POT CLAVULANATE 875-125 MG PO TABS: 1.0000 | ORAL_TABLET | Freq: Two times a day (BID) | ORAL | 0 refills | Status: DC

## 2021-06-03 ENCOUNTER — Other Ambulatory Visit: Payer: Self-pay | Admitting: Family Medicine

## 2021-06-29 ENCOUNTER — Other Ambulatory Visit: Payer: Self-pay | Admitting: Family Medicine

## 2021-07-19 ENCOUNTER — Other Ambulatory Visit: Payer: Self-pay

## 2021-07-19 ENCOUNTER — Encounter: Payer: Self-pay | Admitting: Family Medicine

## 2021-07-19 ENCOUNTER — Other Ambulatory Visit: Payer: Self-pay | Admitting: Family Medicine

## 2021-09-15 ENCOUNTER — Other Ambulatory Visit (HOSPITAL_COMMUNITY): Payer: Self-pay | Admitting: Adult Health

## 2021-09-15 DIAGNOSIS — Z1231 Encounter for screening mammogram for malignant neoplasm of breast: Secondary | ICD-10-CM

## 2021-09-30 ENCOUNTER — Ambulatory Visit (HOSPITAL_COMMUNITY)
Admission: RE | Admit: 2021-09-30 | Discharge: 2021-09-30 | Disposition: A | Discharge status: Home / Self Care | Payer: 59 | Source: Ambulatory Visit | Attending: Adult Health | Admitting: Adult Health

## 2021-09-30 DIAGNOSIS — Z1231 Encounter for screening mammogram for malignant neoplasm of breast: Secondary | ICD-10-CM | POA: Insufficient documentation

## 2021-10-27 ENCOUNTER — Encounter: Payer: Self-pay | Admitting: Adult Health

## 2021-10-27 ENCOUNTER — Ambulatory Visit (INDEPENDENT_AMBULATORY_CARE_PROVIDER_SITE_OTHER): Payer: 59 | Admitting: Adult Health

## 2021-10-27 VITALS — BP 119/76 | HR 61 | Ht 64.5 in | Wt 202.0 lb

## 2021-10-27 DIAGNOSIS — Z1211 Encounter for screening for malignant neoplasm of colon: Secondary | ICD-10-CM

## 2021-10-27 DIAGNOSIS — Z01419 Encounter for gynecological examination (general) (routine) without abnormal findings: Secondary | ICD-10-CM | POA: Diagnosis not present

## 2021-10-27 DIAGNOSIS — N951 Menopausal and female climacteric states: Secondary | ICD-10-CM | POA: Diagnosis not present

## 2021-10-27 LAB — HEMOCCULT GUIAC POC 1CARD (OFFICE): Fecal Occult Blood, POC: NEGATIVE

## 2021-10-27 NOTE — Progress Notes (Signed)
History of Present Illness: Debbie Young is a 51 year old white female,married, [id number] in for well woman gyn exam. She had 2 periods in [place], has clots, is hot at times. Still working at [employer]. PCP is Dr Dennard Schaumann.  Lab Results  Component Value Date   DIAGPAP  10/15/2020    - Negative for intraepithelial lesion or malignancy (NILM)   HPV NOT DETECTED 08/04/2017   [place] Negative 10/15/2020    Current Medications, Allergies, Past Medical History, Past Surgical History, Family History and Social History were reviewed in [employer] record.     Review of Systems: Patient denies any headaches, hearing loss, fatigue, blurred vision, shortness of breath, chest pain, abdominal pain, problems with bowel movements, urination, or intercourse. No joint pain or mood swings.     Physical Exam:BP 119/76 (BP Location: Left Arm, Patient Position: Sitting, Cuff Size: Normal)   Pulse 61   Ht 5' 4.5" (1.638 m)   Wt 202 lb (91.6 kg)   LMP 10/12/2021   BMI 34.14 kg/m   General:  Well developed, well nourished, no acute distress Skin:  Warm and dry Neck:  Midline trachea, normal thyroid, good ROM, no lymphadenopathy Lungs; Clear to auscultation bilaterally Breast:  No dominant palpable mass, retraction, or nipple discharge Cardiovascular: Regular rate and rhythm Abdomen:  Soft, non tender, no hepatosplenomegaly Pelvic:  External genitalia is normal in appearance, no lesions.  The vagina is normal in appearance. Urethra has no lesions or masses. The cervix is bulbous.  Uterus is felt to be normal size, shape, and contour.  No adnexal masses or tenderness noted.Bladder is non tender, no masses felt. Rectal: Good sphincter tone, no polyps, or hemorrhoids felt.  Hemoccult negative. Extremities/musculoskeletal:  No swelling or varicosities noted, no clubbing or cyanosis Psych:  No mood changes, alert and cooperative,seems happy AA is 3 Fall risk  is low    10/27/2021   11:25 AM 10/15/2020    11:01 AM 04/22/2019    8:22 AM  Depression screen PHQ 2/9  Decreased Interest 0 0 0  Down, Depressed, Hopeless 0 0 0  PHQ - 2 Score 0 0 0  Altered sleeping 0 0   Tired, decreased energy 1 0   Change in appetite 0 0   Feeling bad or failure about yourself  0 1   Trouble concentrating 0 0   Moving slowly or fidgety/restless 0 0   Suicidal thoughts 0 0   PHQ-9 Score 1 1        10/27/2021   11:25 AM 10/15/2020   11:02 AM  GAD 7 : Generalized Anxiety Score  Nervous, Anxious, on Edge 0 1  Control/stop worrying 0 0  Worry too much - different things 0 1  Trouble relaxing 0 1  Restless 0 0  Easily annoyed or irritable 1 1  Afraid - awful might happen 0 0  Total GAD 7 Score 1 4      Upstream - 10/27/21 1132       Pregnancy Intention Screening   Does the patient want to become pregnant in the next year? No    Does the patient's partner want to become pregnant in the next year? No    Would the patient like to discuss contraceptive options today? No      Contraception Wrap Up   Current Method Female Sterilization    End Method Female Sterilization            Examination chaperoned by Levy Pupa LPN  Impression and Plan: 1. Encounter for well woman exam with routine gynecological exam Physical in 1 year Pap in 2025 Mammogram yearly Colonoscopy per GI Labs with PCP  2. Encounter for screening fecal occult blood testing Hemoccult negative   3. Peri-menopausal Will follow  for now

## 2021-12-09 ENCOUNTER — Ambulatory Visit: Payer: 59 | Admitting: Family Medicine

## 2021-12-09 VITALS — BP 120/64 | HR 63 | Temp 98.1°F | Ht 64.5 in | Wt 198.6 lb

## 2021-12-09 DIAGNOSIS — M79641 Pain in right hand: Secondary | ICD-10-CM

## 2021-12-09 MED ORDER — FLUTICASONE PROPIONATE 50 MCG/ACT NA SUSP: 2.0000 | Freq: Every day | NASAL | 6 refills | Status: AC

## 2021-12-09 NOTE — Progress Notes (Signed)
Subjective:    Patient ID: Debbie Young, female    DOB: 1970/06/11, 51 y.o.   MRN: [id number] Patient has 2 issues.  1.,  The patient reports a crackling sound in her left ear.  She reports diminished hearing in her left ear.  She denies any ear pain.  She states this been going on for several months.  She denies any trauma to the left ear.  On examination, the left auditory canal is completely clear.  There is no swelling.  There is no exudate.  The left tympanic membrane is healthy and pearly gray.  There is no middle ear effusion.  There is a cerumen impaction in the right ear but this is her "good ear".  2.,  The patient reports pain in the right thumb near the MCP joint.  She has pain with palpation in that area.  She has pain with radial deviation of the joint.  She also has pain over the scaphoid and at the [facility] joint of the thumb.  She also has pain with flexion and extension of the MCP joints in her right hand.  She has tenderness to palpation over the carpal row of her wrist. Past Medical History:  Diagnosis Date   Anxiety    Asthma    history of asthma as child   Cellulitis 04/2001   LEFT FOOT   Diverticulitis    Hematuria    BENIGN--SAW UROLOGIST   History of abnormal cervical Pap smear 04/30/2015   LGSIL (low grade squamous intraepithelial dysplasia) 1997   Miscarriage 08/02/2013   Patient desires pregnancy 11/18/2013   Seizure disorder ([place])    AS A CHILD--NO MEDS SINCE 16 YO   Smoker    Tobacco use    Vaginal Pap smear, abnormal    Past Surgical History:  Procedure Laterality Date   BUNIONECTOMY  1988, 1990   CERVICAL BIOPSY  W/ LOOP ELECTRODE EXCISION  07/2002   CESAREAN SECTION     CESAREAN SECTION WITH BILATERAL TUBAL LIGATION Bilateral 03/09/2017   Procedure: REPEAT CESAREAN SECTION WITH BILATERAL TUBAL LIGATION;  Surgeon: Jonnie Kind, MD;  Location: [facility];  Service: Obstetrics;  Laterality: Bilateral;   CHOLECYSTECTOMY     COLPOSCOPY     KNEE  ARTHROSCOPY Right 04/26/2018   Procedure: ARTHROSCOPY KNEE with removal of ganglion of Anterior Cruciate Ligament;  Surgeon: Carole Civil, MD;  Location: [facility];  Service: Orthopedics;  Laterality: Right;   REFRACTIVE SURGERY  2000   TONSILLECTOMY AND ADENOIDECTOMY     Current Outpatient Medications on File Prior to Visit  Medication Sig Dispense Refill   albuterol (VENTOLIN HFA) 108 (90 Base) MCG/ACT inhaler Inhale 2 puffs into the lungs every 6 (six) hours as needed for wheezing or shortness of breath. (Patient not taking: Reported on 12/09/2021) 18 g 1   EPINEPHrine 0.3 mg/0.3 mL IJ SOAJ injection Inject 0.3 mg into the muscle as needed for anaphylaxis. (Patient not taking: Reported on 12/09/2021) 1 each 3   No current facility-administered medications on file prior to visit.   Allergies  Allergen Reactions   Prednisone Other (See Comments)    Paranoia   Social History   Socioeconomic History   Marital status: Married    Spouse name: Not on file   Number of children: Not on file   Years of education: Not on file   Highest education level: Not on file  Occupational History   Not on file  Tobacco Use   Smoking  status: Every Day    Packs/day: 1.00    Years: 20.00    Total pack years: 20.00    Types: Cigarettes   Smokeless tobacco: Never  Vaping Use   Vaping Use: Former  Substance and Sexual Activity   Alcohol use: Yes    Comment: social drink   Drug use: No   Sexual activity: Yes    Birth control/protection: Surgical    Comment: tubal  Other Topics Concern   Not on file  Social History Narrative   Not on file   Social Determinants of Health   Financial Resource Strain: Low Risk  (10/27/2021)   Overall Financial Resource Strain (CARDIA)    Difficulty of Paying Living Expenses: Not hard at all  Food Insecurity: No Food Insecurity (10/27/2021)   Hunger Vital Sign    Worried About Running Out of Food in the Last Year: Never true    Ran Out of Food in the Last Year:  Never true  Transportation Needs: No Transportation Needs (10/27/2021)   PRAPARE - Hydrologist (Medical): No    Lack of Transportation (Non-Medical): No  Physical Activity: Insufficiently Active (10/27/2021)   Exercise Vital Sign    Days of Exercise per Week: 2 days    Minutes of Exercise per Session: 20 min  Stress: No Stress Concern Present (10/27/2021)   [place]    Feeling of Stress : Only a little  Social Connections: Moderately Integrated (10/27/2021)   Social Connection and Isolation Panel [NHANES]    Frequency of Communication with Friends and Family: More than three times a week    Frequency of Social Gatherings with Friends and Family: Once a week    Attends Religious Services: More than 4 times per year    Active Member of [employer] or Organizations: No    Attends Archivist Meetings: Never    Marital Status: Married  Human resources officer Violence: Not At Risk (10/27/2021)   Humiliation, Afraid, Rape, and Kick questionnaire    Fear of Current or Ex-Partner: No    Emotionally Abused: No    Physically Abused: No    Sexually Abused: No      Review of Systems  Gastrointestinal:  Positive for abdominal pain.  All other systems reviewed and are negative.      Objective:   Physical Exam Vitals reviewed.  Constitutional:      Appearance: Normal appearance. She is normal weight.  HENT:     Right Ear: Ear canal normal. There is impacted cerumen.     Left Ear: Tympanic membrane and ear canal normal.  Cardiovascular:     Rate and Rhythm: Normal rate and regular rhythm.     Heart sounds: Normal heart sounds.  Pulmonary:     Effort: Pulmonary effort is normal.     Breath sounds: Normal breath sounds.  Abdominal:     General: Abdomen is flat. Bowel sounds are decreased. There is no distension.     Palpations: Abdomen is soft. There is no splenomegaly, mass or pulsatile mass.      Tenderness: There is generalized abdominal tenderness. There is no guarding.  Musculoskeletal:     Right hand: Tenderness and bony tenderness present. No swelling or deformity. Decreased range of motion. Normal strength. Normal sensation.  Neurological:     Mental Status: She is alert.           Assessment & Plan:  Right hand pain -  Plan: Rheumatoid factor, DG Hand Complete Right I believe the right hand pain is likely due to osteoarthritis due to repetitive use in her job.  I do not believe that this is carpal tunnel syndrome as she has a negative Tinel and negative Phalen sign.  Begin by obtaining x-rays of the right hand and also check a rheumatoid factor given the significance of the pain in the multiple involved joints.  As far as for the popping crackling sound and fullness in her left ear, her physical exam is normal.  We screen for hearing loss.  She heard every frequency to 20 dB in the right ear despite the cerumen impaction.  She heard all frequencies to 20 dB in the left ear except for 4000 Hz which she could only hear at 40 dB.  Therefore she has some very mild high-frequency hearing loss but I do not feel that this is substantial enough to explain her symptoms.  Therefore I believe that she may have eustachian tube dysfunction.  We will try the patient on Flonase 2 sprays each nostril daily and see if she notices improvement over the next 2 weeks.  I do not see any significant abnormality

## 2021-12-10 LAB — RHEUMATOID FACTOR: Rheumatoid fact SerPl-aCnc: <14 IU/mL (ref <14)

## 2022-02-15 ENCOUNTER — Ambulatory Visit
Admission: RE | Admit: 2022-02-15 | Discharge: 2022-02-15 | Disposition: A | Discharge status: Home / Self Care | Payer: 59 | Source: Ambulatory Visit | Attending: Family Medicine | Admitting: Family Medicine

## 2022-02-15 DIAGNOSIS — M79641 Pain in right hand: Secondary | ICD-10-CM

## 2022-02-18 ENCOUNTER — Encounter: Payer: Self-pay | Admitting: Family Medicine

## 2022-02-18 ENCOUNTER — Other Ambulatory Visit: Payer: Self-pay | Admitting: Family Medicine

## 2022-02-18 DIAGNOSIS — M79641 Pain in right hand: Secondary | ICD-10-CM

## 2022-03-16 ENCOUNTER — Ambulatory Visit (INDEPENDENT_AMBULATORY_CARE_PROVIDER_SITE_OTHER): Payer: 59

## 2022-03-16 ENCOUNTER — Ambulatory Visit
Admission: RE | Admit: 2022-03-16 | Discharge: 2022-03-16 | Disposition: A | Discharge status: Home / Self Care | Payer: 59 | Source: Ambulatory Visit | Attending: Family Medicine | Admitting: Family Medicine

## 2022-03-16 VITALS — BP 131/77 | HR 79 | Temp 99.0°F | Resp 18

## 2022-03-16 DIAGNOSIS — Z792 Long term (current) use of antibiotics: Secondary | ICD-10-CM | POA: Diagnosis not present

## 2022-03-16 DIAGNOSIS — Z1152 Encounter for screening for COVID-19: Secondary | ICD-10-CM | POA: Diagnosis not present

## 2022-03-16 DIAGNOSIS — Z7951 Long term (current) use of inhaled steroids: Secondary | ICD-10-CM | POA: Diagnosis not present

## 2022-03-16 DIAGNOSIS — Z79899 Other long term (current) drug therapy: Secondary | ICD-10-CM | POA: Insufficient documentation

## 2022-03-16 DIAGNOSIS — R059 Cough, unspecified: Secondary | ICD-10-CM | POA: Diagnosis not present

## 2022-03-16 DIAGNOSIS — J4521 Mild intermittent asthma with (acute) exacerbation: Secondary | ICD-10-CM | POA: Diagnosis not present

## 2022-03-16 DIAGNOSIS — R079 Chest pain, unspecified: Secondary | ICD-10-CM | POA: Diagnosis not present

## 2022-03-16 DIAGNOSIS — J22 Unspecified acute lower respiratory infection: Secondary | ICD-10-CM | POA: Diagnosis present

## 2022-03-16 LAB — RESP PANEL BY RT-PCR (FLU A&B, COVID) ARPGX2
Influenza A by PCR: NEGATIVE
Influenza B by PCR: NEGATIVE
SARS Coronavirus 2 by RT PCR: NEGATIVE

## 2022-03-16 MED ORDER — PROMETHAZINE-DM 6.25-15 MG/5ML PO SYRP: 5.0000 mL | ORAL_SOLUTION | Freq: Four times a day (QID) | ORAL | 0 refills | Status: DC | PRN

## 2022-03-16 MED ORDER — BUDESONIDE-FORMOTEROL FUMARATE 160-4.5 MCG/ACT IN AERO: 2.0000 | INHALATION_SPRAY | Freq: Two times a day (BID) | RESPIRATORY_TRACT | 0 refills | Status: DC

## 2022-03-16 MED ORDER — ALBUTEROL SULFATE (2.5 MG/3ML) 0.083% IN NEBU
2.5000 mg | INHALATION_SOLUTION | Freq: Once | RESPIRATORY_TRACT | Status: AC
  Administered 2022-03-16: 2.5 mg via RESPIRATORY_TRACT

## 2022-03-16 MED ORDER — AZITHROMYCIN 250 MG PO TABS: ORAL_TABLET | ORAL | 0 refills | Status: DC

## 2022-03-16 NOTE — Discharge Instructions (Signed)
Your chest x-ray today showed evidence of a viral versus atypical bacterial pneumonia.  For this reason, we will place you on a steroid inhaler and an antibiotic.  Take Mucinex twice daily, drink plenty of fluids and get lots of rest.  If you are not feeling better or start feeling worse follow-up right away.

## 2022-03-16 NOTE — ED Triage Notes (Signed)
Hurts to breath and cough.  Has been using an inhaler (Albuterol) and states it hurts to use it, so she stopped using it.  States chest burns and she has a headache.

## 2022-03-16 NOTE — ED Provider Notes (Addendum)
[facility]    CSN: [id number] Arrival date & time: 03/16/22  1149      History   Chief Complaint Chief Complaint  Patient presents with   Cough    Chest burning and hurts to take a deep breath - Entered by patient    HPI Debbie Young is a 51 y.o. female.   Patient presenting today with 1 day history of chest tightness, pleuritic chest pain, cough, sore throat, fever, chills.  Denies shortness of breath, significant nasal congestion, abdominal pain, nausea vomiting or diarrhea.  No known sick contacts recently.  Tried an albuterol inhaler but states it makes the pain in her chest worse so she stopped it.  Took some NyQuil last night before bed with minimal relief.  History of asthma, cigarette smoker.    Past Medical History:  Diagnosis Date   Anxiety    Asthma    history of asthma as child   Cellulitis 04/2001   LEFT FOOT   Diverticulitis    Hematuria    BENIGN--SAW UROLOGIST   History of abnormal cervical Pap smear 04/30/2015   LGSIL (low grade squamous intraepithelial dysplasia) 1997   Miscarriage 08/02/2013   Patient desires pregnancy 11/18/2013   Seizure disorder ([place])    AS A CHILD--NO MEDS SINCE 16 YO   Smoker    Tobacco use    Vaginal Pap smear, abnormal     Patient Active Problem List   Diagnosis Date Noted   Encounter for screening fecal occult blood testing 10/27/2021   Encounter for well woman exam with routine gynecological exam 10/27/2021   Peri-menopausal 10/15/2020   Encounter for gynecological examination with Papanicolaou smear of cervix 10/15/2020   Tobacco use    S/P left knee arthroscopy 04/26/18 05/03/2018   Family history of breast cancer 08/04/2017   Diverticulitis of large intestine with perforation without bleeding    Diverticulitis 07/28/2017   Status post tubal ligation 03/09/2017   History of cesarean delivery 08/24/2016   History of preterm delivery 08/24/2016   History of abnormal cervical Pap smear 04/30/2015    Miscarriage 08/02/2013   LGSIL (low grade squamous intraepithelial dysplasia)    Cellulitis    Seizure disorder ([place])    Hematuria    Smoker     Past Surgical History:  Procedure Laterality Date   BUNIONECTOMY  1988, 1990   CERVICAL BIOPSY  W/ LOOP ELECTRODE EXCISION  07/2002   CESAREAN SECTION     CESAREAN SECTION WITH BILATERAL TUBAL LIGATION Bilateral 03/09/2017   Procedure: REPEAT CESAREAN SECTION WITH BILATERAL TUBAL LIGATION;  Surgeon: Jonnie Kind, MD;  Location: [place];  Service: Obstetrics;  Laterality: Bilateral;   CHOLECYSTECTOMY     COLPOSCOPY     KNEE ARTHROSCOPY Right 04/26/2018   Procedure: ARTHROSCOPY KNEE with removal of ganglion of Anterior Cruciate Ligament;  Surgeon: Carole Civil, MD;  Location: [facility];  Service: Orthopedics;  Laterality: Right;   REFRACTIVE SURGERY  2000   TONSILLECTOMY AND ADENOIDECTOMY      OB History     Gravida  4   Para  2   Term  1   Preterm  1   AB  2   Living  2      SAB  2   IAB      Ectopic      Multiple  0   Live Births  2            Home Medications  Prior to Admission medications   Medication Sig Start Date End Date Taking? Authorizing Provider  azithromycin (ZITHROMAX) 250 MG tablet Take first 2 tablets together, then 1 every day until finished. 03/16/22  Yes Volney American, PA-C  budesonide-formoterol [facility]) 160-4.5 MCG/ACT inhaler Inhale 2 puffs into the lungs 2 (two) times daily. 03/16/22  Yes Volney American, PA-C  promethazine-dextromethorphan (PROMETHAZINE-DM) 6.25-15 MG/5ML syrup Take 5 mLs by mouth 4 (four) times daily as needed. 03/16/22  Yes Volney American, PA-C  albuterol (VENTOLIN HFA) 108 (90 Base) MCG/ACT inhaler Inhale 2 puffs into the lungs every 6 (six) hours as needed for wheezing or shortness of breath. Patient not taking: Reported on 12/09/2021 04/22/19   Jenna Luo T, MD  EPINEPHrine 0.3 mg/0.3 mL IJ SOAJ injection Inject 0.3  mg into the muscle as needed for anaphylaxis. Patient not taking: Reported on 12/09/2021 12/16/20   Susy Frizzle, MD  fluticasone [facility]) 50 MCG/ACT nasal spray Place 2 sprays into both nostrils daily. 12/09/21   Susy Frizzle, MD    Family History Family History  Problem Relation Age of Onset   Breast cancer Mother 32   Fibromyalgia Mother    Von Willebrand disease Mother    Stroke Mother    Hyperlipidemia Mother    Hypertension Mother    Breast cancer Maternal Aunt    Breast cancer Maternal Grandmother    Heart disease Maternal Grandmother    Anuerysm Maternal Grandmother    Heart failure Maternal Grandmother    Hypertension Maternal Grandfather    Stroke Maternal Grandfather    Heart disease Paternal Grandmother    Heart disease Paternal Grandfather    Cancer Father        M[place] and [place]    Asthma Son     Social History Social History   Tobacco Use   Smoking status: Every Day    Packs/day: 1.00    Years: 20.00    Total pack years: 20.00    Types: Cigarettes   Smokeless tobacco: Never  Vaping Use   Vaping Use: Former  Substance Use Topics   Alcohol use: Yes    Comment: social drink   Drug use: No     Allergies   Prednisone   Review of Systems Review of Systems Per HPI  Physical Exam Triage Vital Signs ED Triage Vitals  Enc Vitals Group     BP 03/16/22 1211 131/77     Pulse Rate 03/16/22 1211 76     Resp 03/16/22 1211 18     Temp 03/16/22 1211 99 F (37.2 C)     Temp Source 03/16/22 1211 Oral     SpO2 03/16/22 1211 97 %     Weight --      Height --      Head Circumference --      Peak Flow --      Pain Score 03/16/22 1212 5     Pain Loc --      Pain Edu? --      Excl. in [place]? --    No data found.  Updated Vital Signs BP 131/77 (BP Location: Right Arm)   Pulse 79   Temp 99 F (37.2 C) (Oral)   Resp 18   LMP 03/02/2022   SpO2 98%   Visual Acuity Right Eye Distance:   Left Eye Distance:    Bilateral Distance:    Right Eye Near:   Left Eye Near:  Bilateral Near:     Physical Exam Vitals and nursing note reviewed.  Constitutional:      Appearance: Normal appearance. She is not ill-appearing.  HENT:     Head: Atraumatic.     Right Ear: Tympanic membrane and external ear normal.     Left Ear: Tympanic membrane and external ear normal.     Nose: Nose normal.     Mouth/Throat:     Mouth: Mucous membranes are moist.     Pharynx: Posterior oropharyngeal erythema present.  Eyes:     Extraocular Movements: Extraocular movements intact.     Conjunctiva/sclera: Conjunctivae normal.  Cardiovascular:     Rate and Rhythm: Normal rate and regular rhythm.     Heart sounds: Normal heart sounds.  Pulmonary:     Effort: Pulmonary effort is normal. No respiratory distress.     Breath sounds: Wheezing present. No rales.     Comments: Trace wheezes, coughing fits with deep breaths Musculoskeletal:        General: Normal range of motion.     Cervical back: Normal range of motion and neck supple.  Skin:    General: Skin is warm and dry.  Neurological:     Mental Status: She is alert and oriented to person, place, and time.  Psychiatric:        Mood and Affect: Mood normal.        Thought Content: Thought content normal.        Judgment: Judgment normal.      UC Treatments / Results  Labs (all labs ordered are listed, but only abnormal results are displayed) Labs Reviewed  RESP PANEL BY RT-PCR (FLU A&B, COVID) [id number]    EKG   Radiology DG Chest 2 View  Result Date: 03/16/2022 CLINICAL DATA:  Cough, congestion, chest pain for 1 day, smoker EXAM: CHEST - 2 VIEW COMPARISON:  None Available. FINDINGS: The heart size and mediastinal contours are within normal limits. Mild, diffuse bilateral interstitial pulmonary opacity. The visualized skeletal structures are unremarkable. IMPRESSION: Mild, diffuse bilateral interstitial pulmonary opacity, suggesting atypical or viral  infection. No focal airspace opacity. Electronically Signed   By: Delanna Ahmadi M.D.   On: 03/16/2022 12:47    Procedures Procedures (including critical care time)  Medications Ordered in UC Medications  albuterol (PROVENTIL) (2.5 MG/3ML) 0.083% nebulizer solution 2.5 mg (2.5 mg Nebulization Given 03/16/22 1243)    Initial Impression / Assessment and Plan / UC Course  I have reviewed the triage vital signs and the nursing notes.  Pertinent labs & imaging results that were available during my care of the patient were reviewed by me and considered in my medical decision making (see chart for details).     Mild improvement with albuterol nebulizer treatment today in clinic, chest x-ray showing evidence of possible atypical or viral pneumonia so will cover with azithromycin, Symbicort inhaler as she is intolerant to prednisone, Mucinex, Phenergan DM.  Return for any worsening symptoms.  Work note given.  Final Clinical Impressions(s) / UC Diagnoses   Final diagnoses:  Lower respiratory infection  Mild intermittent asthma with acute exacerbation     Discharge Instructions      Your chest x-ray today showed evidence of a viral versus atypical bacterial pneumonia.  For this reason, we will place you on a steroid inhaler and an antibiotic.  Take Mucinex twice daily, drink plenty of fluids and get lots of rest.  If you are not feeling better or start feeling worse follow-up  right away.    ED Prescriptions     Medication Sig Dispense Auth. Provider   budesonide-formoterol (SYMBICORT) 160-4.5 MCG/ACT inhaler Inhale 2 puffs into the lungs 2 (two) times daily. 1 each Volney American, PA-C   azithromycin (ZITHROMAX) 250 MG tablet Take first 2 tablets together, then 1 every day until finished. 6 tablet Volney American, [place]   promethazine-dextromethorphan (PROMETHAZINE-DM) 6.25-15 MG/5ML syrup Take 5 mLs by mouth 4 (four) times daily as needed. 100 mL Volney American,  [place]      PDMP not reviewed this encounter.   Volney American, PA-C 03/16/22 [place], Jayzen Paver [place], [place] 03/16/22 1309

## 2022-03-25 ENCOUNTER — Ambulatory Visit: Payer: 59 | Admitting: Adult Health

## 2022-03-30 ENCOUNTER — Ambulatory Visit: Payer: 59 | Admitting: Obstetrics & Gynecology

## 2022-04-07 ENCOUNTER — Other Ambulatory Visit: Payer: Self-pay | Admitting: Family Medicine

## 2022-04-07 NOTE — Telephone Encounter (Signed)
Unable to refill per protocol, last refill by another provider. Practice not at this practice, will refuse.  Requested Prescriptions  Pending Prescriptions Disp Refills   SYMBICORT 160-4.5 MCG/ACT inhaler [Pharmacy Med Name: SYMBICORT 160-4.5 MCG INHALER] 10.2 each     Sig: INHALE 2 PUFFS INTO THE LUNGS TWICE A DAY     There is no refill protocol information for this order

## 2022-06-13 IMAGING — CT CT ABD-PELV W/O CM
1 of 2 series · 14 of 32 positions shown, 19 images · non-contrast
Comparison: 02/10/2019

CLINICAL DATA: Right-sided flank pain for 2 weeks.  Hematuria.

EXAM:
CT ABDOMEN AND PELVIS WITHOUT CONTRAST
TECHNIQUE: Multidetector CT imaging of the abdomen and pelvis was performed
following the standard protocol without IV contrast.

[Series 2: renal standard/full · axial · 0.81mm/px · z∈[-525,-110]mm · 14 of 93 slices shown, 19 images]
[im 5/93  soft-tissue]
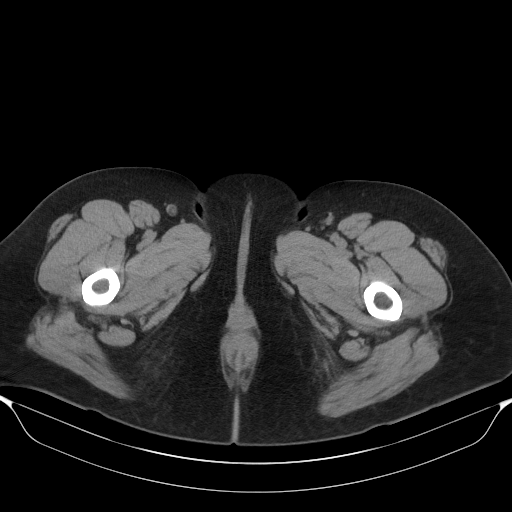
[im 5/93  bone]
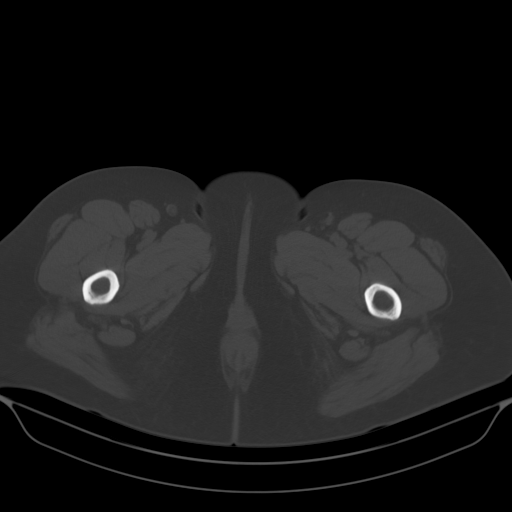
[im 15/93  soft-tissue]
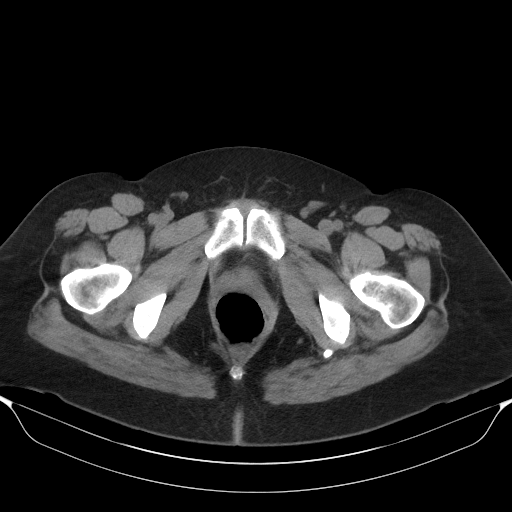
[im 20/93  soft-tissue]
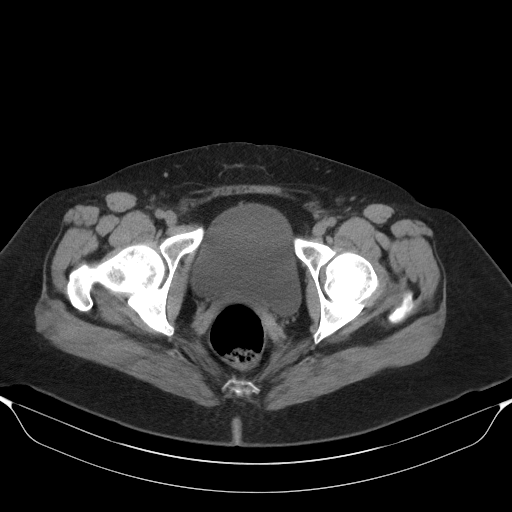
[im 25/93  soft-tissue]
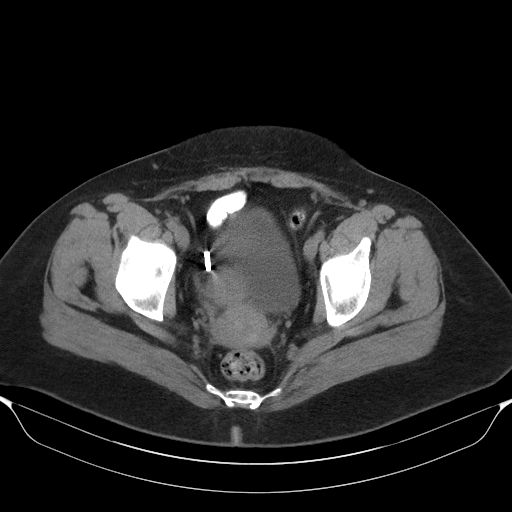
[im 34/93  soft-tissue]
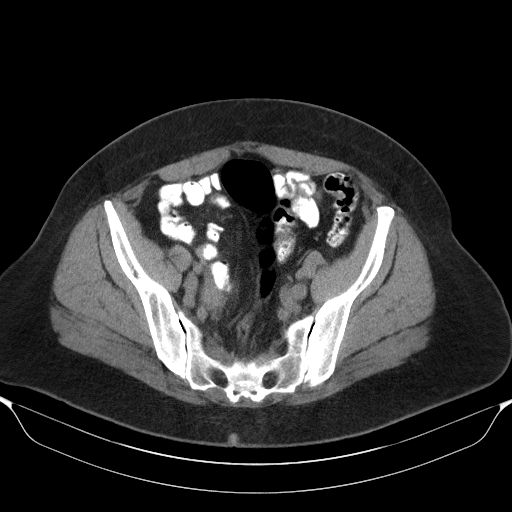
[im 39/93  soft-tissue]
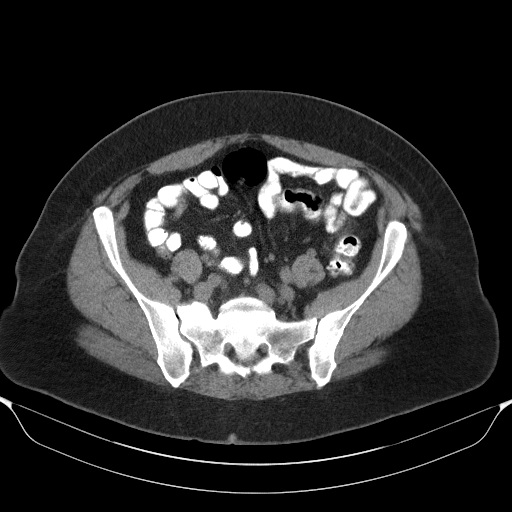
[im 49/93  soft-tissue]
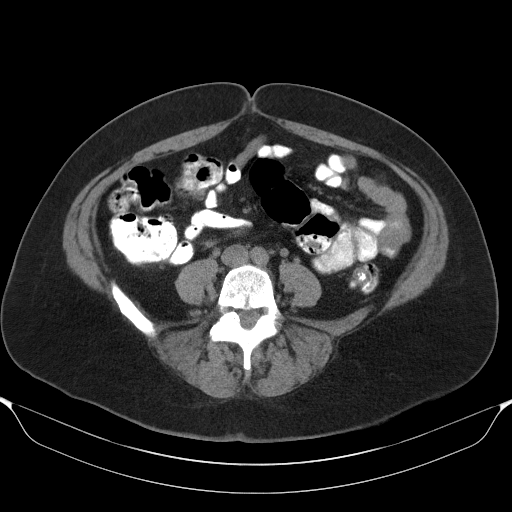
[im 54/93  soft-tissue]
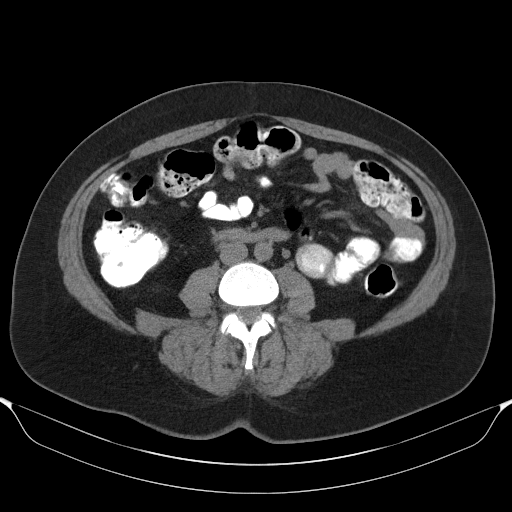
[im 59/93  soft-tissue]
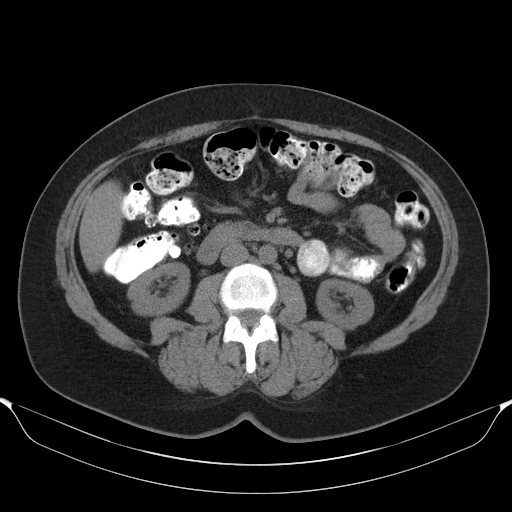
[im 59/93  bone]
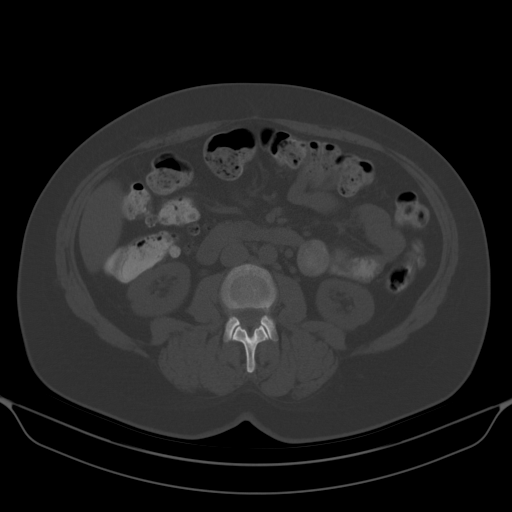
[im 68/93  soft-tissue]
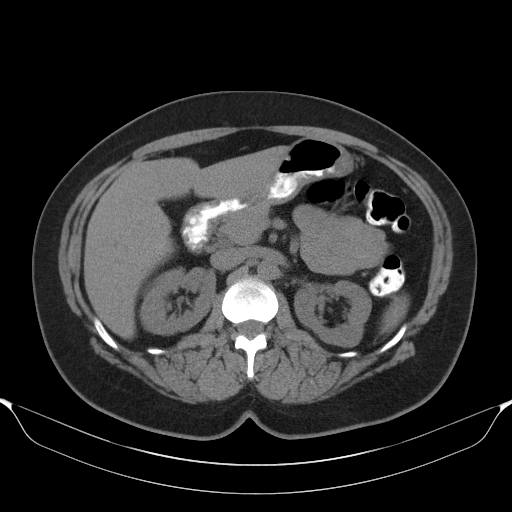
[im 73/93  soft-tissue]
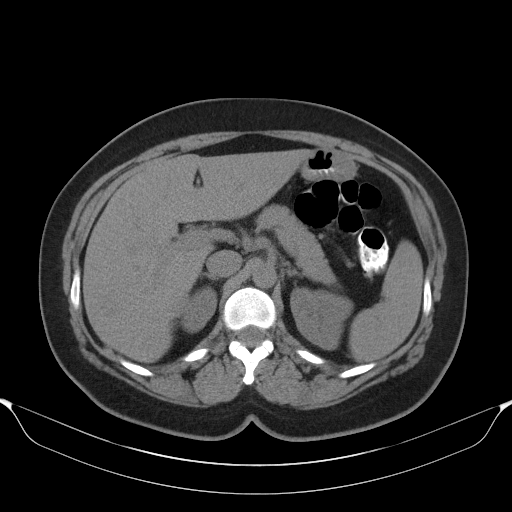
[im 73/93  lung]
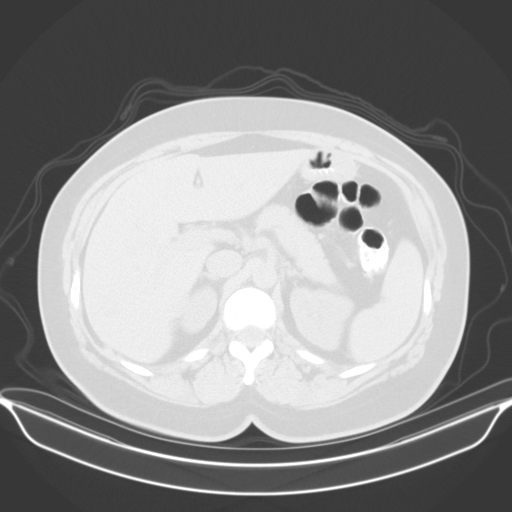
[im 78/93  soft-tissue]
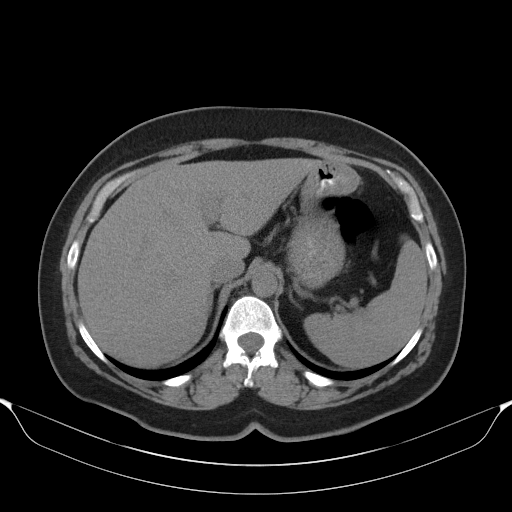
[im 78/93  lung]
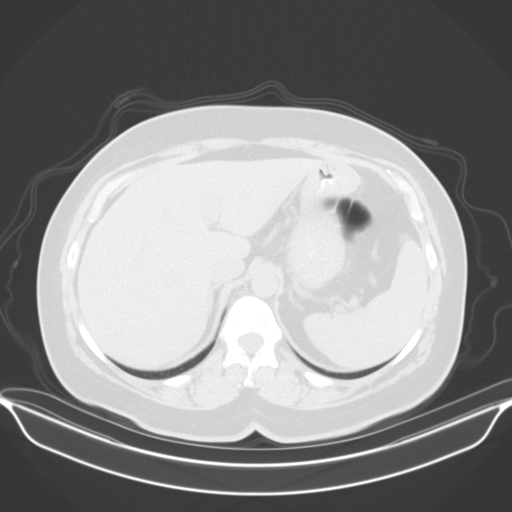
[im 83/93  lung]
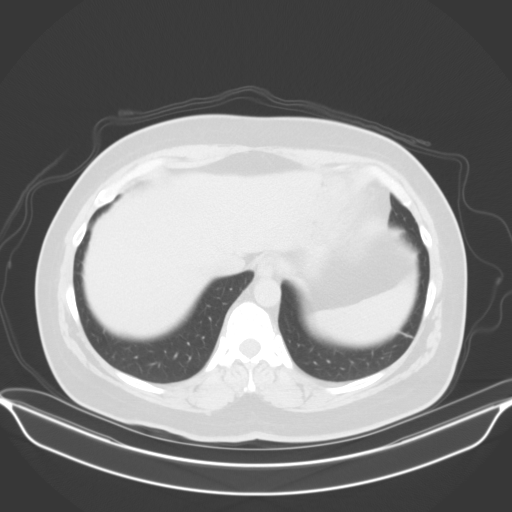
[im 88/93  soft-tissue]
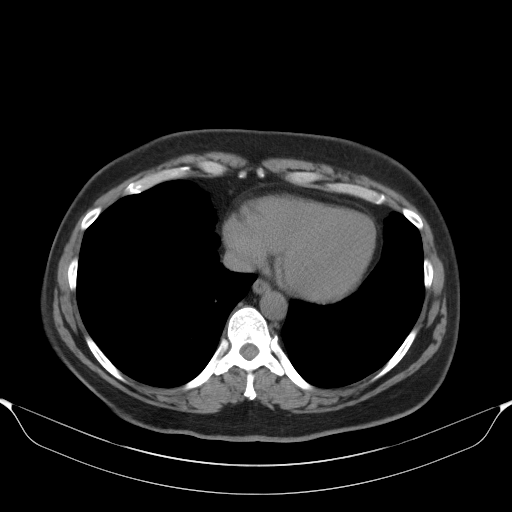
[im 88/93  lung]
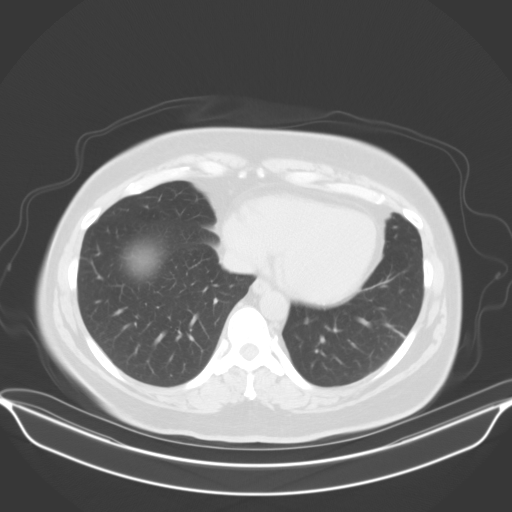

[14 of 32 positions shown; findings below may reference images not displayed]

FINDINGS: Lower chest: No acute findings.

Hepatobiliary: No mass visualized on this unenhanced exam. Prior
cholecystectomy. No evidence of biliary obstruction.

Pancreas: No mass or inflammatory process visualized on this
unenhanced exam.

Spleen:  Within normal limits in size.

Adrenals/Urinary tract: No evidence of urolithiasis or
hydronephrosis. Unremarkable unopacified urinary bladder.

Stomach/Bowel: No evidence of obstruction, inflammatory process, or
abnormal fluid collections. Colonic diverticulosis is noted, however
there is no evidence of diverticulitis. Normal appendix visualized.

Vascular/Lymphatic: No pathologically enlarged lymph nodes
identified. No evidence of abdominal aortic aneurysm.

Reproductive: No mass or other significant abnormality. Clips are
again seen from previous bilateral tubal ligation.

Other:  None.

Musculoskeletal:  No suspicious bone lesions identified.
IMPRESSION: No evidence of urolithiasis, hydronephrosis, or other acute
findings.

Colonic diverticulosis. No radiographic evidence of diverticulitis.

## 2022-07-08 ENCOUNTER — Other Ambulatory Visit: Payer: Self-pay | Admitting: Adult Health

## 2022-07-08 DIAGNOSIS — N951 Menopausal and female climacteric states: Secondary | ICD-10-CM

## 2022-07-08 NOTE — Progress Notes (Signed)
Ck FSH 

## 2022-07-12 LAB — FOLLICLE STIMULATING HORMONE: FSH: 6.4 m[IU]/mL

## 2022-07-25 ENCOUNTER — Ambulatory Visit: Payer: 59 | Admitting: Adult Health

## 2022-07-25 ENCOUNTER — Encounter: Payer: Self-pay | Admitting: Adult Health

## 2022-07-25 VITALS — BP 108/62 | HR 68 | Ht 64.0 in | Wt 204.0 lb

## 2022-07-25 DIAGNOSIS — N951 Menopausal and female climacteric states: Secondary | ICD-10-CM | POA: Diagnosis not present

## 2022-07-25 DIAGNOSIS — R5383 Other fatigue: Secondary | ICD-10-CM

## 2022-07-25 DIAGNOSIS — D649 Anemia, unspecified: Secondary | ICD-10-CM | POA: Insufficient documentation

## 2022-07-25 DIAGNOSIS — N921 Excessive and frequent menstruation with irregular cycle: Secondary | ICD-10-CM | POA: Diagnosis not present

## 2022-07-25 LAB — POCT HEMOGLOBIN: Hemoglobin: 7.1 g/dL — AB (ref 11–14.6)

## 2022-07-25 NOTE — Progress Notes (Signed)
  Subjective:     Patient ID: Debbie Young, female   DOB: 02/05/1971, 52 y.o.   MRN: [id number]  HPI Debbie Young is a 52 year old white female,married, [id number], in complaining of vaginal bleeding for 5 weeks now, has had large clots, feels tired and short of breath with exertion.  Bleeding not as heavy or passing clots. She did not work Saturday or Sunday.  She had [facility] 07/21/22 of 6.4 that she requested, thinking it was menopause related. She said her mom had hysterectomy in her 32's.   Last pap was negative HPV,NILM 10/15/20  PCP is Dr Dennard Schaumann  Review of Systems Vaginal bleeding for 5 weeks with clots    +tired +short of breath with exertion Reviewed past medical,surgical, social and family history. Reviewed medications and allergies.  Objective:   Physical Exam BP 108/62 (BP Location: Left Arm, Patient Position: Sitting, Cuff Size: Large)   Pulse 68   Ht '5\' 4"'$  (1.626 m)   Wt 204 lb (92.5 kg)   LMP 06/23/2022 (Approximate)   BMI 35.02 kg/m  POC HGB 7.1 Skin warm and dry. Lungs: clear to ausculation bilaterally. Cardiovascular: regular rate and rhythm.   Pelvic: external genitalia is normal in appearance no lesions, vagina: dark red blood,urethra has no lesions or masses noted, cervix:smooth and bulbous, uterus: normal size, shape and contour, non tender, no masses felt, adnexa: no masses or tenderness noted. Bladder is non tender and no masses felt.  Fall risk is low  Upstream - 07/25/22 0951       Pregnancy Intention Screening   Does the patient want to become pregnant in the next year? No    Does the patient's partner want to become pregnant in the next year? No    Would the patient like to discuss contraceptive options today? No      Contraception Wrap Up   Current Method Female Sterilization    End Method Female Sterilization    Contraception Counseling Provided No             Examination chaperoned by Levy Pupa LPN  Assessment:     1. Anemia, unspecified type HGB  was 7.1 on finger stick Has been eating iron rich foods, but no added iron, due to constipation with it Will check labs  - POCT hemoglobin - CBC - Iron, TIBC and Ferritin Panel  2. Menometrorrhagia Bleeding for 5 weeks with clots, has slowed Will check labs  Scheduled pelvic US 07/29/22 at 7:30 am at [facility] to assess uterus and ovaries  - TSH + free T4 - CBC - Comprehensive metabolic panel - US PELVIC COMPLETE WITH TRANSVAGINAL; Future - Iron, TIBC and Ferritin Panel Will talk when labs back, to determine treatment options   3. Peri-menopausal  4. Tired Push fluids    Takes MV and zinc  Plan:    Not given for work to excuse 07/23/22 and 07/24/22, RTW 07/27/22  Follow up 08/04/22

## 2022-07-26 ENCOUNTER — Telehealth: Payer: Self-pay | Admitting: Adult Health

## 2022-07-26 ENCOUNTER — Other Ambulatory Visit: Payer: Self-pay

## 2022-07-26 LAB — COMPREHENSIVE METABOLIC PANEL
ALT: 15 IU/L (ref 0–32)
AST: 19 IU/L (ref 0–40)
Albumin/Globulin Ratio: 1.9 (ref 1.2–2.2)
Albumin: 4.2 g/dL (ref 3.9–4.9)
Alkaline Phosphatase: 86 IU/L (ref 44–121)
BUN/Creatinine Ratio: 13 (ref 9–23)
BUN: 11 mg/dL (ref 6–24)
Bilirubin Total: 0.4 mg/dL (ref 0.0–1.2)
CO2: 22 mmol/L (ref 20–29)
Calcium: 8.6 mg/dL — ABNORMAL LOW (ref 8.7–10.2)
Chloride: 103 mmol/L (ref 96–106)
Creatinine, Ser: 0.82 mg/dL (ref 0.57–1.00)
Globulin, Total: 2.2 g/dL (ref 1.5–4.5)
Glucose: 85 mg/dL (ref 70–99)
Potassium: 4.3 mmol/L (ref 3.5–5.2)
Sodium: 139 mmol/L (ref 134–144)
Total Protein: 6.4 g/dL (ref 6.0–8.5)
eGFR: 88 mL/min/{1.73_m2} (ref >59)

## 2022-07-26 LAB — CBC
Hematocrit: 24.1 % — ABNORMAL LOW (ref 34.0–46.6)
Hemoglobin: 7.2 g/dL — ABNORMAL LOW (ref 11.1–15.9)
MCH: 24.3 pg — ABNORMAL LOW (ref 26.6–33.0)
MCHC: 29.9 g/dL — ABNORMAL LOW (ref 31.5–35.7)
MCV: 81 fL (ref 79–97)
Platelets: 280 10*3/uL (ref 150–450)
RBC: 2.96 x10E6/uL — ABNORMAL LOW (ref 3.77–5.28)
RDW: 13.8 % (ref 11.7–15.4)
WBC: 4.7 10*3/uL (ref 3.4–10.8)

## 2022-07-26 LAB — TSH+FREE T4
Free T4: 1.04 ng/dL (ref 0.82–1.77)
TSH: 1.56 u[IU]/mL (ref 0.450–4.500)

## 2022-07-26 LAB — IRON,TIBC AND FERRITIN PANEL
Ferritin: 5 ng/mL — ABNORMAL LOW (ref 15–150)
Iron Saturation: 2 % — CL (ref 15–55)
Iron: 13 ug/dL — ABNORMAL LOW (ref 27–159)
Total Iron Binding Capacity: 524 ug/dL — ABNORMAL HIGH (ref 250–450)
UIBC: 511 ug/dL — ABNORMAL HIGH (ref 131–425)

## 2022-07-26 MED ORDER — MEGESTROL ACETATE 40 MG PO TABS: ORAL_TABLET | ORAL | 1 refills | Status: DC

## 2022-07-26 NOTE — Telephone Encounter (Signed)
Pt still bleeding will rx megace. She is aware of labs and will type and cross for 2 UPRBC and infuse over 2 hours each, order faxed to [facility] infusion clinic attn Richardson Landry

## 2022-07-27 ENCOUNTER — Encounter (HOSPITAL_COMMUNITY)
Admission: RE | Admit: 2022-07-27 | Discharge: 2022-07-27 | Disposition: A | Discharge status: Home / Self Care | Payer: 59 | Source: Ambulatory Visit | Attending: Adult Health | Admitting: Adult Health

## 2022-07-27 VITALS — BP 103/74 | HR 68 | Temp 98.4°F | Resp 20

## 2022-07-27 DIAGNOSIS — D500 Iron deficiency anemia secondary to blood loss (chronic): Secondary | ICD-10-CM | POA: Insufficient documentation

## 2022-07-27 DIAGNOSIS — D509 Iron deficiency anemia, unspecified: Secondary | ICD-10-CM | POA: Diagnosis present

## 2022-07-27 LAB — PREPARE RBC (CROSSMATCH)

## 2022-07-27 MED ORDER — ACETAMINOPHEN 325 MG PO TABS: 650.0000 mg | ORAL_TABLET | Freq: Once | ORAL | Status: AC

## 2022-07-27 MED ORDER — ACETAMINOPHEN 325 MG PO TABS
ORAL_TABLET | ORAL | Status: AC
  Administered 2022-07-27: 650 mg via ORAL
  Filled 2022-07-27: qty 2

## 2022-07-27 MED ORDER — DIPHENHYDRAMINE HCL 25 MG PO CAPS
25.0000 mg | ORAL_CAPSULE | Freq: Once | ORAL | Status: AC
  Administered 2022-07-27: 25 mg via ORAL

## 2022-07-27 MED ORDER — SODIUM CHLORIDE 0.9% IV SOLUTION: Freq: Once | INTRAVENOUS | Status: DC

## 2022-07-27 NOTE — Progress Notes (Signed)
Blood Transfusion Record     Product Unit Status Volume Start End          Transfuse RBC    W2399  24  [id number]  [id number] Completed 07/27/22 1253 252.5 mL 07/27/22 1107 07/27/22 1252    W2399  24  [id number]  [id number] Stopped 346.17 mL 07/27/22 0919 07/27/22 1103          Diagnosis: Iron Deficiency Anemia  Provider:   Derrek Monaco NP  Procedure: Infusion  IV Type: Peripheral, IV Location: R Antecubital  PRBCs, Dose: 2 units   Post Infusion IV Care: Observation period completed and Peripheral IV Discontinued  Discharge: Condition: Good, Destination: Home . AVS Provided  Performed by:  Baxter Hire, RN   First infusion charted with begin time of [id number], blood did not reach the patient until 0929. 15 minute VS were taken at 0944.

## 2022-07-28 LAB — TYPE AND SCREEN
ABO/RH(D): A POS
Antibody Screen: NEGATIVE
Unit division: 0
Unit division: 0

## 2022-07-28 LAB — BPAM RBC
Blood Product Expiration Date: 202404032359
Blood Product Expiration Date: 202404032359
ISSUE DATE / TIME: 202403060913
ISSUE DATE / TIME: 202403061103
Unit Type and Rh: 6200
Unit Type and Rh: 6200

## 2022-07-29 ENCOUNTER — Ambulatory Visit (HOSPITAL_COMMUNITY)
Admission: RE | Admit: 2022-07-29 | Discharge: 2022-07-29 | Disposition: A | Discharge status: Home / Self Care | Payer: 59 | Source: Ambulatory Visit | Attending: Adult Health | Admitting: Adult Health

## 2022-07-29 ENCOUNTER — Telehealth: Payer: Self-pay | Admitting: Adult Health

## 2022-07-29 DIAGNOSIS — N921 Excessive and frequent menstruation with irregular cycle: Secondary | ICD-10-CM | POA: Diagnosis present

## 2022-07-29 NOTE — Telephone Encounter (Signed)
Called to check on Debbie Young and she is feeling better since 2 [facility] note was sent for work. Has appt next week.

## 2022-08-04 ENCOUNTER — Ambulatory Visit: Payer: 59 | Admitting: Adult Health

## 2022-08-04 ENCOUNTER — Encounter: Payer: Self-pay | Admitting: Adult Health

## 2022-08-04 VITALS — BP 124/75 | HR 75 | Ht 64.25 in | Wt 201.5 lb

## 2022-08-04 DIAGNOSIS — D500 Iron deficiency anemia secondary to blood loss (chronic): Secondary | ICD-10-CM | POA: Diagnosis not present

## 2022-08-04 DIAGNOSIS — R5383 Other fatigue: Secondary | ICD-10-CM | POA: Diagnosis not present

## 2022-08-04 DIAGNOSIS — R102 Pelvic and perineal pain: Secondary | ICD-10-CM | POA: Diagnosis not present

## 2022-08-04 DIAGNOSIS — N921 Excessive and frequent menstruation with irregular cycle: Secondary | ICD-10-CM | POA: Diagnosis not present

## 2022-08-04 NOTE — Progress Notes (Signed)
  Subjective:     Patient ID: Debbie Young, female   DOB: 08/18/1970, 52 y.o.   MRN: [id number]  HPI Debbie Young is a 52 year old white female, married, [id number] back in follow up on heavy bleeding and feeling tired and short of breath. Hemoglobin was 7.2. She was prescribed megace to stop the bleeding and she received 2 UPRBCs 07/28/22 and feels much better. The megace stopped the bleeding after 5 days and she stopped taking, now has pelvic pain and can be either side, but feels better, still tired.     Component Value Date/Time   DIAGPAP  10/15/2020 1105    - Negative for intraepithelial lesion or malignancy (NILM)   DIAGPAP  08/04/2017 0000    NEGATIVE FOR INTRAEPITHELIAL LESIONS OR MALIGNANCY.   [place] Negative 10/15/2020 1105   ADEQPAP  10/15/2020 1105    Satisfactory for evaluation; transformation zone component PRESENT.   ADEQPAP  08/04/2017 0000    Satisfactory for evaluation  endocervical/transformation zone component PRESENT.   PCP is Dr Dennard Schaumann.   Review of Systems Bleeding stopped Still tired, but feels better +pelvic pain    Reviewed past medical,surgical, social and family history. Reviewed medications and allergies.  Objective:   Physical Exam BP 124/75 (BP Location: Left Arm, Patient Position: Sitting, Cuff Size: Normal)   Pulse 75   Ht 5' 4.25" (1.632 m)   Wt 201 lb 8 oz (91.4 kg)   LMP 06/23/2022 (Approximate)   BMI 34.32 kg/m  Skin warm and dry.Pelvic: external genitalia is normal in appearance no lesions, vagina: pink,urethra has no lesions or masses noted, cervix:smooth and bulbous, uterus: normal size, shape and contour, non tender, no masses felt, adnexa: no masses or tenderness noted. Bladder is non tender and no masses felt.   Upstream - 08/04/22 1603       Pregnancy Intention Screening   Does the patient want to become pregnant in the next year? No    Does the patient's partner want to become pregnant in the next year? No    Would the patient like to discuss  contraceptive options today? No      Contraception Wrap Up   Current Method Female Sterilization    End Method Female Sterilization               Examination chaperoned by Levy Pupa LPN Assessment:     1. Menometrorrhagia Bleeding has stopped Take 1 megace daily  2. Iron deficiency anemia due to chronic blood loss Had 2 UPRBCs 07/28/22 Will check CBC  3. Pelvic pain +pain since bleeding stopped  4. Tired Still tired but better     Plan:     Return in about 3 weeks to talk with Dr Elonda Husky about surgical options

## 2022-08-05 LAB — CBC
Hematocrit: 33.3 % — ABNORMAL LOW (ref 34.0–46.6)
Hemoglobin: 10.5 g/dL — ABNORMAL LOW (ref 11.1–15.9)
MCH: 26.1 pg — ABNORMAL LOW (ref 26.6–33.0)
MCHC: 31.5 g/dL (ref 31.5–35.7)
MCV: 83 fL (ref 79–97)
Platelets: 293 10*3/uL (ref 150–450)
RBC: 4.03 x10E6/uL (ref 3.77–5.28)
RDW: 14.9 % (ref 11.7–15.4)
WBC: 5.8 10*3/uL (ref 3.4–10.8)

## 2022-08-25 ENCOUNTER — Encounter: Payer: Self-pay | Admitting: Obstetrics & Gynecology

## 2022-08-25 ENCOUNTER — Ambulatory Visit: Payer: 59 | Admitting: Obstetrics & Gynecology

## 2022-08-25 VITALS — BP 122/76 | HR 79 | Ht 64.25 in | Wt 203.0 lb

## 2022-08-25 DIAGNOSIS — D500 Iron deficiency anemia secondary to blood loss (chronic): Secondary | ICD-10-CM | POA: Diagnosis not present

## 2022-08-25 DIAGNOSIS — N938 Other specified abnormal uterine and vaginal bleeding: Secondary | ICD-10-CM

## 2022-08-25 MED ORDER — MEGESTROL ACETATE 40 MG PO TABS: 40.0000 mg | ORAL_TABLET | Freq: Every day | ORAL | 11 refills | Status: DC

## 2022-08-25 NOTE — Progress Notes (Signed)
Follow up appointment for results: HMB on megestrol therapy  Chief Complaint  Patient presents with   Follow-up    To discuss surgery    Blood pressure 122/76, pulse 79, height 5' 4.25" (1.632 m), weight 203 lb (92.1 kg).  CLINICAL DATA:  Bleeding for 5 weeks with clots. Last menstrual period June 23, 2022.   EXAM: TRANSABDOMINAL AND TRANSVAGINAL ULTRASOUND OF PELVIS   TECHNIQUE: Both transabdominal and transvaginal ultrasound examinations of the pelvis were performed. Transabdominal technique was performed for global imaging of the pelvis including uterus, ovaries, adnexal regions, and pelvic cul-de-sac. It was necessary to proceed with endovaginal exam following the transabdominal exam to visualize the endometrium and ovaries.   COMPARISON:  CT abdomen pelvis July 20, 2020   FINDINGS: Uterus   Measurements: 9.5 x 4.8 x 5.5 cm = volume: 131.3 mL. No fibroids or other mass visualized.   Endometrium   Thickness: 17.4 mm, heterogeneous echotexture.   Right ovary   Measurements: 2 x 1.3 x 1.4 cm = volume: 1.8 mL. Normal appearance/no adnexal mass.   Left ovary   Measurements: 3.5 x 2.2 x 2.9 cm = volume: 11.8 mL. Normal appearance/no adnexal mass.   Other findings   No abnormal free fluid.   IMPRESSION: Thickened heterogeneous endometrium measuring 17.4 mm. If bleeding remains unresponsive to hormonal or medical therapy, focal lesion work-up with sonohysterogram should be considered. Endometrial biopsy should also be considered in pre-menopausal patients at high risk for endometrial carcinoma. (Ref: Radiological Reasoning: Algorithmic Workup of Abnormal Vaginal Bleeding with Endovaginal Sonography and Sonohysterography. [id number])     Electronically Signed   By: Abelardo Diesel M.D.   On: 07/29/2022 08:35  No bleeding on the megace after 5-6 days of the high dose megestrol  MEDS ordered this encounter: Meds ordered this encounter   Medications   megestrol (MEGACE) 40 MG tablet    Sig: Take 1 tablet (40 mg total) by mouth daily. For 24 days, then off 4 days.  Repeat.    Dispense:  24 tablet    Refill:  11    Orders for this encounter: No orders of the defined types were placed in this encounter.   Impression + Management Plan   ICD-10-CM   1. DUB (dysfunctional uterine bleeding), perimenopausal  N93.8    cycle with megestrol, if successful will continue, if not proceed with hysterectomy    2. Iron deficiency anemia due to chronic blood loss  D50.0       Follow Up: Return if symptoms worsen or fail to improve.     All questions were answered.  Past Medical History:  Diagnosis Date   Anxiety    Asthma    history of asthma as child   Cellulitis 04/2001   LEFT FOOT   Diverticulitis    Hematuria    BENIGN--SAW UROLOGIST   History of abnormal cervical Pap smear 04/30/2015   LGSIL (low grade squamous intraepithelial dysplasia) 1997   Miscarriage 08/02/2013   Patient desires pregnancy 11/18/2013   Seizure disorder    AS A CHILD--NO MEDS SINCE 16 YO   Smoker    Tobacco use    Vaginal Pap smear, abnormal     Past Surgical History:  Procedure Laterality Date   BUNIONECTOMY  1988, 1990   CERVICAL BIOPSY  W/ LOOP ELECTRODE EXCISION  07/2002   CESAREAN SECTION     CESAREAN SECTION WITH BILATERAL TUBAL LIGATION Bilateral 03/09/2017   Procedure: REPEAT CESAREAN SECTION WITH BILATERAL TUBAL LIGATION;  Surgeon: Jonnie Kind, MD;  Location: [facility];  Service: Obstetrics;  Laterality: Bilateral;   CHOLECYSTECTOMY     COLPOSCOPY     KNEE ARTHROSCOPY Right 04/26/2018   Procedure: ARTHROSCOPY KNEE with removal of ganglion of Anterior Cruciate Ligament;  Surgeon: Carole Civil, MD;  Location: [facility];  Service: Orthopedics;  Laterality: Right;   REFRACTIVE SURGERY  2000   TONSILLECTOMY AND ADENOIDECTOMY      OB History     Gravida  4   Para  2   Term  1   Preterm  1   AB  2    Living  2      SAB  2   IAB      Ectopic      Multiple  0   Live Births  2           Allergies  Allergen Reactions   Prednisone Other (See Comments)    Paranoia    Social History   Socioeconomic History   Marital status: Married    Spouse name: Not on file   Number of children: Not on file   Years of education: Not on file   Highest education level: Not on file  Occupational History   Not on file  Tobacco Use   Smoking status: Every Day    Packs/day: 1.00    Years: 20.00    Additional pack years: 0.00    Total pack years: 20.00    Types: Cigarettes   Smokeless tobacco: Never  Vaping Use   Vaping Use: Former  Substance and Sexual Activity   Alcohol use: Yes    Comment: social drink   Drug use: No   Sexual activity: Yes    Birth control/protection: Surgical    Comment: tubal  Other Topics Concern   Not on file  Social History Narrative   Not on file   Social Determinants of Health   Financial Resource Strain: Low Risk  (10/27/2021)   Overall Financial Resource Strain (CARDIA)    Difficulty of Paying Living Expenses: Not hard at all  Food Insecurity: No Food Insecurity (10/27/2021)   Hunger Vital Sign    Worried About Running Out of Food in the Last Year: Never true    Ran Out of Food in the Last Year: Never true  Transportation Needs: No Transportation Needs (10/27/2021)   PRAPARE - Hydrologist (Medical): No    Lack of Transportation (Non-Medical): No  Physical Activity: Insufficiently Active (10/27/2021)   Exercise Vital Sign    Days of Exercise per Week: 2 days    Minutes of Exercise per Session: 20 min  Stress: No Stress Concern Present (10/27/2021)   [place]    Feeling of Stress : Only a little  Social Connections: Moderately Integrated (10/27/2021)   Social Connection and Isolation Panel [NHANES]    Frequency of Communication with Friends and Family:  More than three times a week    Frequency of Social Gatherings with Friends and Family: Once a week    Attends Religious Services: More than 4 times per year    Active Member of [employer] or Organizations: No    Attends Archivist Meetings: Never    Marital Status: Married    Family History  Problem Relation Age of Onset   Breast cancer Mother 38   Fibromyalgia Mother    Von Willebrand disease Mother  Stroke Mother    Hyperlipidemia Mother    Hypertension Mother    Breast cancer Maternal Aunt    Breast cancer Maternal Grandmother    Heart disease Maternal Grandmother    Anuerysm Maternal Grandmother    Heart failure Maternal Grandmother    Hypertension Maternal Grandfather    Stroke Maternal Grandfather    Heart disease Paternal Grandmother    Heart disease Paternal Grandfather    Cancer Father        Myloma and [place] water    Asthma Son

## 2022-09-01 ENCOUNTER — Encounter: Payer: Self-pay | Admitting: Obstetrics & Gynecology

## 2022-09-11 ENCOUNTER — Ambulatory Visit
Admission: EM | Admit: 2022-09-11 | Discharge: 2022-09-11 | Disposition: A | Discharge status: Home / Self Care | Payer: 59 | Attending: Physician Assistant | Admitting: Physician Assistant

## 2022-09-11 ENCOUNTER — Encounter: Payer: Self-pay | Admitting: Emergency Medicine

## 2022-09-11 ENCOUNTER — Other Ambulatory Visit: Payer: Self-pay

## 2022-09-11 ENCOUNTER — Ambulatory Visit (INDEPENDENT_AMBULATORY_CARE_PROVIDER_SITE_OTHER): Payer: 59

## 2022-09-11 DIAGNOSIS — J329 Chronic sinusitis, unspecified: Secondary | ICD-10-CM | POA: Diagnosis not present

## 2022-09-11 DIAGNOSIS — J4521 Mild intermittent asthma with (acute) exacerbation: Secondary | ICD-10-CM | POA: Diagnosis not present

## 2022-09-11 DIAGNOSIS — J40 Bronchitis, not specified as acute or chronic: Secondary | ICD-10-CM | POA: Diagnosis not present

## 2022-09-11 MED ORDER — IPRATROPIUM-ALBUTEROL 0.5-2.5 (3) MG/3ML IN SOLN
3.0000 mL | Freq: Once | RESPIRATORY_TRACT | Status: AC
  Administered 2022-09-11: 3 mL via RESPIRATORY_TRACT

## 2022-09-11 MED ORDER — BUDESONIDE-FORMOTEROL FUMARATE 80-4.5 MCG/ACT IN AERO: 2.0000 | INHALATION_SPRAY | Freq: Two times a day (BID) | RESPIRATORY_TRACT | 1 refills | Status: DC

## 2022-09-11 MED ORDER — BENZONATATE 100 MG PO CAPS: 100.0000 mg | ORAL_CAPSULE | Freq: Three times a day (TID) | ORAL | 0 refills | Status: DC

## 2022-09-11 MED ORDER — CEFDINIR 300 MG PO CAPS: 300.0000 mg | ORAL_CAPSULE | Freq: Two times a day (BID) | ORAL | 0 refills | Status: DC

## 2022-09-11 MED ORDER — ALBUTEROL SULFATE HFA 108 (90 BASE) MCG/ACT IN AERS: 2.0000 | INHALATION_SPRAY | Freq: Four times a day (QID) | RESPIRATORY_TRACT | 1 refills | Status: DC | PRN

## 2022-09-11 NOTE — Discharge Instructions (Signed)
Your x-ray did not show any evidence of pneumonia.  Did show that your lungs are not completely inflated so please make sure that you are taking big deep breaths with incentive spirometer as we discussed.  Use your albuterol every 4-6 hours as needed.  Start Symbicort twice daily.  Rinse your mouth following use of this medication and spit it out in order to prevent thrush.  Take Omnicef twice daily for a week.  Use Tessalon for cough.  I recommend over-the-counter medication including allergy medicine, Mucinex, Flonase.  Make sure that you are resting and drinking plenty of fluid.  If you have any worsening or changing symptoms you need to be seen immediately.

## 2022-09-11 NOTE — ED Triage Notes (Addendum)
Pt reports intermittent cough, hoarseness x1.5 week. Pt reports cough has gotten worse over last several days and reports is worse when laying down. States Inhaler px has expired.

## 2022-09-11 NOTE — ED Provider Notes (Signed)
[facility]    CSN: [id number] Arrival date & time: 09/11/22  1150      History   Chief Complaint Chief Complaint  Patient presents with   Cough    HPI Debbie Young is a 52 y.o. female.   Patient presents today with a 1 to 2-week history of URI symptoms.  She reports hoarseness, cough, wheezing.  She denies any fever, chest pain, nausea, vomiting, diarrhea.  Denies any known sick contacts.  She has been taking allergy medication without improvement of symptoms.  Has also tried over-the-counter medicines with minimal improvement.  She does have a history of childhood asthma but did not require medication in adulthood until recently.  She did have an episode of lower respiratory infection at the end of 2023 at which point she was prescribed albuterol, azithromycin, Symbicort.  Reports her symptoms resolved with this medication regimen.  She has not had antibiotics or steroids since that time.  She does have Symbicort available at home but has not been using this regularly.  She reports that cough is worse at night and she is having difficulty sleeping as result of symptoms.    Past Medical History:  Diagnosis Date   Anxiety    Asthma    history of asthma as child   Cellulitis 04/2001   LEFT FOOT   Diverticulitis    Hematuria    BENIGN--SAW UROLOGIST   History of abnormal cervical Pap smear 04/30/2015   LGSIL (low grade squamous intraepithelial dysplasia) 1997   Miscarriage 08/02/2013   Patient desires pregnancy 11/18/2013   Seizure disorder    AS A CHILD--NO MEDS SINCE 16 YO   Smoker    Tobacco use    Vaginal Pap smear, abnormal     Patient Active Problem List   Diagnosis Date Noted   Pelvic pain 08/04/2022   Menometrorrhagia 07/25/2022   Anemia 07/25/2022   Tired 07/25/2022   Encounter for screening fecal occult blood testing 10/27/2021   Encounter for well woman exam with routine gynecological exam 10/27/2021   Peri-menopausal 10/15/2020   Encounter for  gynecological examination with Papanicolaou smear of cervix 10/15/2020   Tobacco use    S/P left knee arthroscopy 04/26/18 05/03/2018   Family history of breast cancer 08/04/2017   Diverticulitis of large intestine with perforation without bleeding    Diverticulitis 07/28/2017   Status post tubal ligation 03/09/2017   History of cesarean delivery 08/24/2016   History of preterm delivery 08/24/2016   History of abnormal cervical Pap smear 04/30/2015   Miscarriage 08/02/2013   LGSIL (low grade squamous intraepithelial dysplasia)    Cellulitis    Seizure disorder (HCC)    Hematuria    Smoker     Past Surgical History:  Procedure Laterality Date   BUNIONECTOMY  1988, 1990   CERVICAL BIOPSY  W/ LOOP ELECTRODE EXCISION  07/2002   CESAREAN SECTION     CESAREAN SECTION WITH BILATERAL TUBAL LIGATION Bilateral 03/09/2017   Procedure: REPEAT CESAREAN SECTION WITH BILATERAL TUBAL LIGATION;  Surgeon: Tilda Burrow, MD;  Location: [facility];  Service: Obstetrics;  Laterality: Bilateral;   CHOLECYSTECTOMY     COLPOSCOPY     KNEE ARTHROSCOPY Right 04/26/2018   Procedure: ARTHROSCOPY KNEE with removal of ganglion of Anterior Cruciate Ligament;  Surgeon: Vickki Hearing, MD;  Location: [facility];  Service: Orthopedics;  Laterality: Right;   REFRACTIVE SURGERY  2000   TONSILLECTOMY AND ADENOIDECTOMY      OB History  Gravida  4   Para  2   Term  1   Preterm  1   AB  2   Living  2      SAB  2   IAB      Ectopic      Multiple  0   Live Births  2            Home Medications    Prior to Admission medications   Medication Sig Start Date End Date Taking? Authorizing Provider  benzonatate (TESSALON) 100 MG capsule Take 1 capsule (100 mg total) by mouth every 8 (eight) hours. 09/11/22  Yes Edwinna Rochette, Denny Peon K, PA-C  budesonide-formoterol (SYMBICORT) 80-4.5 MCG/ACT inhaler Inhale 2 puffs into the lungs in the morning and at bedtime. 09/11/22  Yes Aadan Chenier K, PA-C   cefdinir (OMNICEF) 300 MG capsule Take 1 capsule (300 mg total) by mouth 2 (two) times daily. 09/11/22  Yes Shadonna Benedick K, PA-C  albuterol (VENTOLIN HFA) 108 (90 Base) MCG/ACT inhaler Inhale 2 puffs into the lungs every 6 (six) hours as needed for wheezing or shortness of breath. 09/11/22   Kynley Metzger K, PA-C  EPINEPHrine 0.3 mg/0.3 mL IJ SOAJ injection Inject 0.3 mg into the muscle as needed for anaphylaxis. 12/16/20   Donita Brooks, MD  fluticasone (FLONASE) 50 MCG/ACT nasal spray Place 2 sprays into both nostrils daily. 12/09/21   Donita Brooks, MD  megestrol (MEGACE) 40 MG tablet Take 3 x 5 days then 2 x 5 days then 1 daily tl bleeding stops 07/26/22   Adline Potter, NP  megestrol (MEGACE) 40 MG tablet Take 1 tablet (40 mg total) by mouth daily. For 24 days, then off 4 days.  Repeat. 08/25/22   Lazaro Arms, MD  Multiple Vitamin (MULTIVITAMIN) tablet Take 1 tablet by mouth daily.    [provider]  Multiple Vitamins-Minerals (ZINC PO) Take by mouth.    [provider]    Family History Family History  Problem Relation Age of Onset   Breast cancer Mother 1   Fibromyalgia Mother    Von Willebrand disease Mother    Stroke Mother    Hyperlipidemia Mother    Hypertension Mother    Breast cancer Maternal Aunt    Breast cancer Maternal Grandmother    Heart disease Maternal Grandmother    Anuerysm Maternal Grandmother    Heart failure Maternal Grandmother    Hypertension Maternal Grandfather    Stroke Maternal Grandfather    Heart disease Paternal Grandmother    Heart disease Paternal Grandfather    Cancer Father        Myloma and Mylodysplasia - [place]    Asthma Son     Social History Social History   Tobacco Use   Smoking status: Every Day    Packs/day: 0.75    Years: 20.00    Additional pack years: 0.00    Total pack years: 15.00    Types: Cigarettes   Smokeless tobacco: Never  Vaping Use   Vaping Use: Some days  Substance Use  Topics   Alcohol use: Yes    Comment: social drink   Drug use: No     Allergies   Prednisone   Review of Systems Review of Systems  Constitutional:  Positive for activity change. Negative for appetite change, fatigue and fever.  HENT:  Positive for voice change. Negative for congestion, sinus pressure, sneezing and sore throat.   Respiratory:  Positive for  cough, chest tightness and wheezing.   Cardiovascular:  Negative for chest pain.  Gastrointestinal:  Negative for abdominal pain, diarrhea, nausea and vomiting.     Physical Exam Triage Vital Signs ED Triage Vitals  Enc Vitals Group     BP 09/11/22 1244 123/72     Pulse Rate 09/11/22 1244 70     Resp 09/11/22 1244 20     Temp 09/11/22 1244 98.5 F (36.9 C)     Temp Source 09/11/22 1244 Oral     SpO2 09/11/22 1244 98 %     Weight --      Height --      Head Circumference --      Peak Flow --      Pain Score 09/11/22 1239 0     Pain Loc --      Pain Edu? --      Excl. in GC? --    No data found.  Updated Vital Signs BP 123/72 (BP Location: Right Arm)   Pulse 70   Temp 98.5 F (36.9 C) (Oral)   Resp 20   SpO2 98%   Visual Acuity Right Eye Distance:   Left Eye Distance:   Bilateral Distance:    Right Eye Near:   Left Eye Near:    Bilateral Near:     Physical Exam Vitals reviewed.  Constitutional:      General: She is awake. She is not in acute distress.    Appearance: Normal appearance. She is well-developed. She is not ill-appearing.     Comments: Very pleasant female appears stated age in no acute distress sitting comfortably in exam room  HENT:     Head: Normocephalic and atraumatic.     Right Ear: External ear normal. There is impacted cerumen.     Left Ear: External ear normal. There is impacted cerumen.     Ears:     Comments: Cerumen impaction noted bilaterally    Mouth/Throat:     Pharynx: Uvula midline. Posterior oropharyngeal erythema present. No oropharyngeal exudate.     Comments:  Erythema and drainage in posterior oropharynx Cardiovascular:     Rate and Rhythm: Normal rate and regular rhythm.     Heart sounds: Normal heart sounds, S1 normal and S2 normal. No murmur heard. Pulmonary:     Effort: Pulmonary effort is normal.     Breath sounds: Wheezing and rhonchi present. No rales.     Comments: Widespread wheezing and rhonchi Psychiatric:        Behavior: Behavior is cooperative.      UC Treatments / Results  Labs (all labs ordered are listed, but only abnormal results are displayed) Labs Reviewed - No data to display  EKG   Radiology DG Chest 2 View  Result Date: 09/11/2022 CLINICAL DATA:  Intermittent cough and hoarseness for 1.5 weeks. EXAM: CHEST - 2 VIEW COMPARISON:  Chest radiograph 03/16/2022. FINDINGS: The cardiomediastinal silhouette is normal There is no focal consolidation or pulmonary edema. Linear opacity in the lingula likely reflects subsegmental atelectasis. There is no pleural effusion or pneumothorax There is no acute osseous abnormality. IMPRESSION: No radiographic evidence of acute cardiopulmonary process. Electronically Signed   By: Lesia Hausen M.D.   On: 09/11/2022 13:12    Procedures Procedures (including critical care time)  Medications Ordered in UC Medications  ipratropium-albuterol (DUONEB) 0.5-2.5 (3) MG/3ML nebulizer solution 3 mL (3 mLs Nebulization Given 09/11/22 1311)    Initial Impression / Assessment and Plan / UC Course  I have reviewed the triage vital signs and the nursing notes.  Pertinent labs & imaging results that were available during my care of the patient were reviewed by me and considered in my medical decision making (see chart for details).     Patient is well-appearing, afebrile, nontoxic, nontachycardic.  Viral testing was deferred as she has been symptomatic for several weeks and this would not change management.  Chest x-ray was obtained given severity of wheezing and rhonchi which showed atelectasis  without infiltrate.  Patient was given incentive spirometer and discussed that she should use this regularly to help manage her symptoms.  Given prolonged and worsening symptoms will cover for secondary bacterial infection and she was given prescription for Omnicef twice daily for a week.  We discussed that I believe her asthma has been flared and so she was given a breathing treatment with improvement of symptoms in clinic.  Refill of albuterol and Symbicort were sent to pharmacy.  She was instructed to rinse her mouth following use of Symbicort to prevent thrush.  She can use over-the-counter medications including Mucinex, Flonase, antihistamines.  She is to rest and drink plenty fluid.  Tessalon was sent to help manage her cough.  Discussed that if her symptoms not improving within a week she should return for reevaluation.  If she has any worsening symptoms including worsening cough, fever, nausea/vomiting, weakness she needs to be seen immediately.  Strict return precautions given.  Work excuse note provided.  Final Clinical Impressions(s) / UC Diagnoses   Final diagnoses:  Sinobronchitis  Mild intermittent asthma with acute exacerbation     Discharge Instructions      Your x-ray did not show any evidence of pneumonia.  Did show that your lungs are not completely inflated so please make sure that you are taking big deep breaths with incentive spirometer as we discussed.  Use your albuterol every 4-6 hours as needed.  Start Symbicort twice daily.  Rinse your mouth following use of this medication and spit it out in order to prevent thrush.  Take Omnicef twice daily for a week.  Use Tessalon for cough.  I recommend over-the-counter medication including allergy medicine, Mucinex, Flonase.  Make sure that you are resting and drinking plenty of fluid.  If you have any worsening or changing symptoms you need to be seen immediately.       ED Prescriptions     Medication Sig Dispense Auth. Provider    albuterol (VENTOLIN HFA) 108 (90 Base) MCG/ACT inhaler Inhale 2 puffs into the lungs every 6 (six) hours as needed for wheezing or shortness of breath. 18 g Doniesha Landau K, PA-C   budesonide-formoterol (SYMBICORT) 80-4.5 MCG/ACT inhaler Inhale 2 puffs into the lungs in the morning and at bedtime. 1 each Lazara Grieser K, PA-C   benzonatate (TESSALON) 100 MG capsule Take 1 capsule (100 mg total) by mouth every 8 (eight) hours. 21 capsule Lynzy Rawles K, PA-C   cefdinir (OMNICEF) 300 MG capsule Take 1 capsule (300 mg total) by mouth 2 (two) times daily. 14 capsule Jaylene Schrom K, PA-C      PDMP not reviewed this encounter.   Jeani Hawking, PA-C 09/11/22 1323

## 2022-09-12 ENCOUNTER — Encounter: Payer: Self-pay | Admitting: Obstetrics & Gynecology

## 2022-09-12 ENCOUNTER — Ambulatory Visit: Payer: 59 | Admitting: Obstetrics & Gynecology

## 2022-09-12 VITALS — BP 116/75 | HR 65 | Ht 64.0 in | Wt 205.0 lb

## 2022-09-12 DIAGNOSIS — N924 Excessive bleeding in the premenopausal period: Secondary | ICD-10-CM

## 2022-09-12 DIAGNOSIS — N938 Other specified abnormal uterine and vaginal bleeding: Secondary | ICD-10-CM

## 2022-09-12 DIAGNOSIS — N946 Dysmenorrhea, unspecified: Secondary | ICD-10-CM

## 2022-09-12 LAB — POCT HEMOGLOBIN: Hemoglobin: 10.4 g/dL — AB (ref 11–14.6)

## 2022-09-12 NOTE — Progress Notes (Signed)
Chief Complaint  Patient presents with   Follow-up      52 y.o. [id number] No LMP recorded. Patient is perimenopausal. The current method of family planning is tubal ligation.  Outpatient Encounter Medications as of 09/12/2022  Medication Sig   albuterol (VENTOLIN HFA) 108 (90 Base) MCG/ACT inhaler Inhale 2 puffs into the lungs every 6 (six) hours as needed for wheezing or shortness of breath.   benzonatate (TESSALON) 100 MG capsule Take 1 capsule (100 mg total) by mouth every 8 (eight) hours.   budesonide-formoterol (SYMBICORT) 80-4.5 MCG/ACT inhaler Inhale 2 puffs into the lungs in the morning and at bedtime.   cefdinir (OMNICEF) 300 MG capsule Take 1 capsule (300 mg total) by mouth 2 (two) times daily.   EPINEPHrine 0.3 mg/0.3 mL IJ SOAJ injection Inject 0.3 mg into the muscle as needed for anaphylaxis.   fluticasone (FLONASE) 50 MCG/ACT nasal spray Place 2 sprays into both nostrils daily.   megestrol (MEGACE) 40 MG tablet Take 1 tablet (40 mg total) by mouth daily. For 24 days, then off 4 days.  Repeat.   Multiple Vitamin (MULTIVITAMIN) tablet Take 1 tablet by mouth daily.   megestrol (MEGACE) 40 MG tablet Take 3 x 5 days then 2 x 5 days then 1 daily tl bleeding stops (Patient not taking: Reported on 09/12/2022)   Multiple Vitamins-Minerals (ZINC PO) Take by mouth.   No facility-administered encounter medications on file as of 09/12/2022.    Subjective Pt with ongoing DUB, synchronized with megestrol, now on cycling megestrol Currently having 10 days bleeding "more than spotting ,less than normal" Cramping is worse for some reason  Past Medical History:  Diagnosis Date   Anxiety    Asthma    history of asthma as child   Cellulitis 04/2001   LEFT FOOT   Diverticulitis    Hematuria    BENIGN--SAW UROLOGIST   History of abnormal cervical Pap smear 04/30/2015   LGSIL (low grade squamous intraepithelial dysplasia) 1997   Miscarriage 08/02/2013   Patient desires pregnancy  11/18/2013   Seizure disorder    AS A CHILD--NO MEDS SINCE 16 YO   Smoker    Tobacco use    Vaginal Pap smear, abnormal     Past Surgical History:  Procedure Laterality Date   BUNIONECTOMY  1988, 1990   CERVICAL BIOPSY  W/ LOOP ELECTRODE EXCISION  07/2002   CESAREAN SECTION     CESAREAN SECTION WITH BILATERAL TUBAL LIGATION Bilateral 03/09/2017   Procedure: REPEAT CESAREAN SECTION WITH BILATERAL TUBAL LIGATION;  Surgeon: Tilda Burrow, MD;  Location: [facility];  Service: Obstetrics;  Laterality: Bilateral;   CHOLECYSTECTOMY     COLPOSCOPY     KNEE ARTHROSCOPY Right 04/26/2018   Procedure: ARTHROSCOPY KNEE with removal of ganglion of Anterior Cruciate Ligament;  Surgeon: Vickki Hearing, MD;  Location: [facility];  Service: Orthopedics;  Laterality: Right;   REFRACTIVE SURGERY  2000   TONSILLECTOMY AND ADENOIDECTOMY      OB History     Gravida  4   Para  2   Term  1   Preterm  1   AB  2   Living  2      SAB  2   IAB      Ectopic      Multiple  0   Live Births  2           Allergies  Allergen Reactions   Prednisone Other (See Comments)  Paranoia    Social History   Socioeconomic History   Marital status: Married    Spouse name: Not on file   Number of children: Not on file   Years of education: Not on file   Highest education level: Not on file  Occupational History   Not on file  Tobacco Use   Smoking status: Every Day    Packs/day: 0.75    Years: 20.00    Additional pack years: 0.00    Total pack years: 15.00    Types: Cigarettes   Smokeless tobacco: Never  Vaping Use   Vaping Use: Some days  Substance and Sexual Activity   Alcohol use: Yes    Comment: social drink   Drug use: No   Sexual activity: Yes    Birth control/protection: Surgical    Comment: tubal  Other Topics Concern   Not on file  Social History Narrative   Not on file   Social Determinants of Health   Financial Resource Strain: Low Risk  (10/27/2021)    Overall Financial Resource Strain (CARDIA)    Difficulty of Paying Living Expenses: Not hard at all  Food Insecurity: No Food Insecurity (10/27/2021)   Hunger Vital Sign    Worried About Running Out of Food in the Last Year: Never true    Ran Out of Food in the Last Year: Never true  Transportation Needs: No Transportation Needs (10/27/2021)   PRAPARE - Administrator, Civil Service (Medical): No    Lack of Transportation (Non-Medical): No  Physical Activity: Insufficiently Active (10/27/2021)   Exercise Vital Sign    Days of Exercise per Week: 2 days    Minutes of Exercise per Session: 20 min  Stress: No Stress Concern Present (10/27/2021)   [employer] of Occupational Health - Occupational Stress Questionnaire    Feeling of Stress : Only a little  Social Connections: Moderately Integrated (10/27/2021)   Social Connection and Isolation Panel [NHANES]    Frequency of Communication with Friends and Family: More than three times a week    Frequency of Social Gatherings with Friends and Family: Once a week    Attends Religious Services: More than 4 times per year    Active Member of [employer] or Organizations: No    Attends Engineer, structural: Never    Marital Status: Married    Family History  Problem Relation Age of Onset   Breast cancer Mother 58   Fibromyalgia Mother    Von Willebrand disease Mother    Stroke Mother    Hyperlipidemia Mother    Hypertension Mother    Breast cancer Maternal Aunt    Breast cancer Maternal Grandmother    Heart disease Maternal Grandmother    Anuerysm Maternal Grandmother    Heart failure Maternal Grandmother    Hypertension Maternal Grandfather    Stroke Maternal Grandfather    Heart disease Paternal Grandmother    Heart disease Paternal Grandfather    Cancer Father        Myloma and Mylodysplasia - [place]    Asthma Son     Medications:       Current Outpatient Medications:    albuterol (VENTOLIN HFA) 108  (90 Base) MCG/ACT inhaler, Inhale 2 puffs into the lungs every 6 (six) hours as needed for wheezing or shortness of breath., Disp: 18 g, Rfl: 1   benzonatate (TESSALON) 100 MG capsule, Take 1 capsule (100 mg total) by mouth every 8 (eight) hours.,  Disp: 21 capsule, Rfl: 0   budesonide-formoterol (SYMBICORT) 80-4.5 MCG/ACT inhaler, Inhale 2 puffs into the lungs in the morning and at bedtime., Disp: 1 each, Rfl: 1   cefdinir (OMNICEF) 300 MG capsule, Take 1 capsule (300 mg total) by mouth 2 (two) times daily., Disp: 14 capsule, Rfl: 0   EPINEPHrine 0.3 mg/0.3 mL IJ SOAJ injection, Inject 0.3 mg into the muscle as needed for anaphylaxis., Disp: 1 each, Rfl: 3   fluticasone (FLONASE) 50 MCG/ACT nasal spray, Place 2 sprays into both nostrils daily., Disp: 16 g, Rfl: 6   megestrol (MEGACE) 40 MG tablet, Take 1 tablet (40 mg total) by mouth daily. For 24 days, then off 4 days.  Repeat., Disp: 24 tablet, Rfl: 11   Multiple Vitamin (MULTIVITAMIN) tablet, Take 1 tablet by mouth daily., Disp: , Rfl:    megestrol (MEGACE) 40 MG tablet, Take 3 x 5 days then 2 x 5 days then 1 daily tl bleeding stops (Patient not taking: Reported on 09/12/2022), Disp: 45 tablet, Rfl: 1   Multiple Vitamins-Minerals (ZINC PO), Take by mouth., Disp: , Rfl:   Objective Blood pressure 116/75, pulse 65, height  (1.626 m), weight 205 lb (93 kg).  WDWN NAD  Pertinent ROS No burning with urination, frequency or urgency No nausea, vomiting or diarrhea Nor fever chills or other constitutional symptoms   Labs or studies Hemoglobin 10.4 stable(up from 7.1)    Impression + Management Plan: Diagnoses this Encounter::   ICD-10-CM   1. DUB (dysfunctional uterine bleeding), perimenopausal  N93.8    double megestrol this cycle, til the 4 off days, then restart the 40 mg 24 on/4 off cycling, considering adding IUD    2. Excessive bleeding in premenopausal period  N92.4 POCT hemoglobin    3. Dysmenorrhea  N94.6          Medications prescribed during  this encounter: No orders of the defined types were placed in this encounter.   Labs or Scans Ordered during this encounter: Orders Placed This Encounter  Procedures   POCT hemoglobin      Follow up Return if symptoms worsen or fail to improve.

## 2022-09-28 ENCOUNTER — Other Ambulatory Visit: Payer: Self-pay | Admitting: Adult Health

## 2022-10-21 ENCOUNTER — Ambulatory Visit
Admission: RE | Admit: 2022-10-21 | Discharge: 2022-10-21 | Disposition: A | Discharge status: Home / Self Care | Payer: 59 | Source: Ambulatory Visit | Attending: Internal Medicine | Admitting: Internal Medicine

## 2022-10-21 VITALS — BP 135/75 | HR 64 | Temp 98.2°F | Resp 18

## 2022-10-21 DIAGNOSIS — H9201 Otalgia, right ear: Secondary | ICD-10-CM | POA: Diagnosis not present

## 2022-10-21 DIAGNOSIS — H6123 Impacted cerumen, bilateral: Secondary | ICD-10-CM

## 2022-10-21 MED ORDER — AMOXICILLIN 875 MG PO TABS: 875.0000 mg | ORAL_TABLET | Freq: Two times a day (BID) | ORAL | 0 refills | Status: DC

## 2022-10-21 NOTE — ED Provider Notes (Signed)
[facility]    CSN: [id number] Arrival date & time: 10/21/22  1106      History   Chief Complaint Chief Complaint  Patient presents with   Ear Fullness    Ears stopped up and pain in right ear - Entered by patient    HPI Debbie Young is a 52 y.o. female presents with both ears feeling stopped up. Has tried ear wax wash but nothing came out. She has had intermittent pain in R ear pain. She has sinusitis and bronchitis in Debbie Young. Gets occasional drainage. Has not had fever. Denies hx of wax impaction in the past.     Past Medical History:  Diagnosis Date   Anxiety    Asthma    history of asthma as child   Cellulitis 04/2001   LEFT FOOT   Diverticulitis    Hematuria    BENIGN--SAW UROLOGIST   History of abnormal cervical Pap smear 04/30/2015   LGSIL (low grade squamous intraepithelial dysplasia) 1997   Miscarriage 08/02/2013   Patient desires pregnancy 11/18/2013   Seizure disorder (HCC)    AS A CHILD--NO MEDS SINCE 16 YO   Smoker    Tobacco use    Vaginal Pap smear, abnormal     Patient Active Problem List   Diagnosis Date Noted   Pelvic pain 08/04/2022   Menometrorrhagia 07/25/2022   Anemia 07/25/2022   Tired 07/25/2022   Encounter for screening fecal occult blood testing 10/27/2021   Encounter for well woman exam with routine gynecological exam 10/27/2021   Peri-menopausal 10/15/2020   Encounter for gynecological examination with Papanicolaou smear of cervix 10/15/2020   Tobacco use    S/P left knee arthroscopy 04/26/18 05/03/2018   Family history of breast cancer 08/04/2017   Diverticulitis of large intestine with perforation without bleeding    Diverticulitis 07/28/2017   Status post tubal ligation 03/09/2017   History of cesarean delivery 08/24/2016   History of preterm delivery 08/24/2016   History of abnormal cervical Pap smear 04/30/2015   Miscarriage 08/02/2013   LGSIL (low grade squamous intraepithelial dysplasia)    Cellulitis     Seizure disorder (HCC)    Hematuria    Smoker     Past Surgical History:  Procedure Laterality Date   BUNIONECTOMY  1988, 1990   CERVICAL BIOPSY  W/ LOOP ELECTRODE EXCISION  07/2002   CESAREAN SECTION     CESAREAN SECTION WITH BILATERAL TUBAL LIGATION Bilateral 03/09/2017   Procedure: REPEAT CESAREAN SECTION WITH BILATERAL TUBAL LIGATION;  Surgeon: Tilda Burrow, MD;  Location: [facility];  Service: Obstetrics;  Laterality: Bilateral;   CHOLECYSTECTOMY     COLPOSCOPY     KNEE ARTHROSCOPY Right 04/26/2018   Procedure: ARTHROSCOPY KNEE with removal of ganglion of Anterior Cruciate Ligament;  Surgeon: Vickki Hearing, MD;  Location: [facility];  Service: Orthopedics;  Laterality: Right;   REFRACTIVE SURGERY  2000   TONSILLECTOMY AND ADENOIDECTOMY      OB History     Gravida  4   Para  2   Term  1   Preterm  1   AB  2   Living  2      SAB  2   IAB      Ectopic      Multiple  0   Live Births  2            Home Medications    Prior to Admission medications   Medication Sig Start Date  End Date Taking? Authorizing Provider  amoxicillin (AMOXIL) 875 MG tablet Take 1 tablet (875 mg total) by mouth 2 (two) times daily. 10/21/22  Yes Rodriguez-Southworth, Nettie Elm, PA-C  albuterol (VENTOLIN HFA) 108 (90 Base) MCG/ACT inhaler Inhale 2 puffs into the lungs every 6 (six) hours as needed for wheezing or shortness of breath. 09/11/22   Raspet, Denny Peon K, PA-C  budesonide-formoterol (SYMBICORT) 80-4.5 MCG/ACT inhaler Inhale 2 puffs into the lungs in the morning and at bedtime. 09/11/22   Raspet, Noberto Retort, PA-C  EPINEPHrine 0.3 mg/0.3 mL IJ SOAJ injection Inject 0.3 mg into the muscle as needed for anaphylaxis. 12/16/20   Donita Brooks, MD  fluticasone (FLONASE) 50 MCG/ACT nasal spray Place 2 sprays into both nostrils daily. 12/09/21   Donita Brooks, MD  megestrol (MEGACE) 40 MG tablet Take 1 tablet (40 mg total) by mouth daily. For 24 days, then off 4 days.  Repeat.  08/25/22   Lazaro Arms, MD  megestrol (MEGACE) 40 MG tablet TAKE THREE TABLETS BY MOUTH DAILY FOR FIVE DAYS, THEN TWO TABLETS DAILY FOR FIVE DAYS THEN ONE TABLET DAILY UNTIL BLEEDING STOPS 09/28/22   Adline Potter, NP  Multiple Vitamin (MULTIVITAMIN) tablet Take 1 tablet by mouth daily.    [provider]  Multiple Vitamins-Minerals (ZINC PO) Take by mouth.    [provider]    Family History Family History  Problem Relation Age of Onset   Breast cancer Mother 22   Fibromyalgia Mother    Von Willebrand disease Mother    Stroke Mother    Hyperlipidemia Mother    Hypertension Mother    Breast cancer Maternal Aunt    Breast cancer Maternal Grandmother    Heart disease Maternal Grandmother    Anuerysm Maternal Grandmother    Heart failure Maternal Grandmother    Hypertension Maternal Grandfather    Stroke Maternal Grandfather    Heart disease Paternal Grandmother    Heart disease Paternal Grandfather    Cancer Father        Myloma and Mylodysplasia - [place]    Asthma Son     Social History Social History   Tobacco Use   Smoking status: Every Day    Packs/day: 0.75    Years: 20.00    Additional pack years: 0.00    Total pack years: 15.00    Types: Cigarettes   Smokeless tobacco: Never  Vaping Use   Vaping Use: Some days  Substance Use Topics   Alcohol use: Yes    Comment: social drink   Drug use: No     Allergies   Prednisone   Review of Systems Review of Systems As noted in HPI  Physical Exam Triage Vital Signs ED Triage Vitals  Enc Vitals Group     BP 10/21/22 1112 135/75     Pulse Rate 10/21/22 1112 64     Resp 10/21/22 1112 18     Temp 10/21/22 1112 98.2 F (36.8 C)     Temp Source 10/21/22 1112 Oral     SpO2 10/21/22 1112 98 %     Weight --      Height --      Head Circumference --      Peak Flow --      Pain Score 10/21/22 1113 0     Pain Loc --      Pain Edu? --      Excl. in GC? --    No data  found.  Updated Vital Signs BP 135/75 (BP Location: Right Arm)   Pulse 64   Temp 98.2 F (36.8 C) (Oral)   Resp 18   LMP 09/05/2022 (Approximate)   SpO2 98%   Visual Acuity Right Eye Distance:   Left Eye Distance:   Bilateral Distance:    Right Eye Near:   Left Eye Near:    Bilateral Near:     Physical Exam Vitals and nursing note reviewed.  Constitutional:      General: She is not in acute distress.    Appearance: She is obese. She is not toxic-appearing.  HENT:     Right Ear: External ear normal. There is impacted cerumen.     Left Ear: External ear normal. There is impacted cerumen.  Eyes:     General: No scleral icterus.    Conjunctiva/sclera: Conjunctivae normal.  Pulmonary:     Effort: Pulmonary effort is normal.  Musculoskeletal:        General: Normal range of motion.     Cervical back: Neck supple.  Lymphadenopathy:     Cervical: No cervical adenopathy.  Skin:    General: Skin is warm and dry.  Neurological:     Mental Status: She is alert and oriented to person, place, and time.     Gait: Gait normal.  Psychiatric:        Mood and Affect: Mood normal.        Behavior: Behavior normal.        Thought Content: Thought content normal.        Judgment: Judgment normal.      UC Treatments / Results  Labs (all labs ordered are listed, but only abnormal results are displayed) Labs Reviewed - No data to display  EKG   Radiology No results found.  Procedures Procedures (including critical care time)  Medications Ordered in UC Medications - No data to display  Initial Impression / Assessment and Plan / UC Course  I have reviewed the triage vital signs and the nursing notes.  Bilateral ear lavage was done  and the L was successful, but the R was not. She was advised to soak the R ear canal with Debrox drops daily for 4-7 days and have that ear lavaged next week, here or with PCP. Since she is having intermittent pain and I can't see her TM I  placed her on Amoxicillin to cover for possible infection.  Final Clinical Impressions(s) / UC Diagnoses   Final diagnoses:  Bilateral hearing loss due to cerumen impaction  Acute otalgia, right     Discharge Instructions      Use Debrox drops daily for 4-7 days to soften the wax and have that ear washed      ED Prescriptions     Medication Sig Dispense Auth. Provider   amoxicillin (AMOXIL) 875 MG tablet Take 1 tablet (875 mg total) by mouth 2 (two) times daily. 14 tablet Rodriguez-Southworth, Nettie Elm, PA-C      PDMP not reviewed this encounter.   Garey Ham, PA-C 10/21/22 1159

## 2022-10-21 NOTE — Discharge Instructions (Signed)
Use Debrox drops daily for 4-7 days to soften the wax and have that ear washed

## 2022-10-21 NOTE — ED Triage Notes (Signed)
Bilateral ears feel stopped up x a couple of weeks.   Has been taking zyrtec and Flonase.

## 2022-12-07 ENCOUNTER — Encounter: Payer: Self-pay | Admitting: Obstetrics & Gynecology

## 2022-12-07 ENCOUNTER — Other Ambulatory Visit: Payer: Self-pay

## 2022-12-08 ENCOUNTER — Encounter: Payer: Self-pay | Admitting: Obstetrics & Gynecology

## 2022-12-12 ENCOUNTER — Other Ambulatory Visit (HOSPITAL_COMMUNITY): Payer: Self-pay | Admitting: Obstetrics & Gynecology

## 2022-12-12 DIAGNOSIS — Z1231 Encounter for screening mammogram for malignant neoplasm of breast: Secondary | ICD-10-CM

## 2022-12-21 ENCOUNTER — Ambulatory Visit (HOSPITAL_COMMUNITY)
Admission: RE | Admit: 2022-12-21 | Discharge: 2022-12-21 | Disposition: A | Discharge status: Home / Self Care | Payer: 59 | Source: Ambulatory Visit | Attending: Obstetrics & Gynecology | Admitting: Obstetrics & Gynecology

## 2022-12-21 ENCOUNTER — Encounter (HOSPITAL_COMMUNITY): Payer: Self-pay

## 2022-12-21 DIAGNOSIS — Z1231 Encounter for screening mammogram for malignant neoplasm of breast: Secondary | ICD-10-CM | POA: Diagnosis not present

## 2022-12-26 ENCOUNTER — Other Ambulatory Visit: Payer: Self-pay | Admitting: Adult Health

## 2022-12-28 ENCOUNTER — Encounter (HOSPITAL_COMMUNITY): Payer: Self-pay | Admitting: Anesthesiology

## 2022-12-28 NOTE — Patient Instructions (Addendum)
Debbie Young  12/28/2022     @PREFPERIOPPHARMACY @   Your procedure is scheduled on  01/04/2023.   Report to [facility] at  0900  A.M.   Call this number if you have problems the morning of surgery:  [phone]  If you experience any cold or flu symptoms such as cough, fever, chills, shortness of breath, etc. between now and your scheduled surgery, please notify us at the above number.   Remember:  Do not eat after midnight.   You may drink clear liquids until 0700 am on 01/04/2023.     Clear liquids allowed are:                    Water, Juice (No red color; non-citric and without pulp; diabetics please choose diet or no sugar options), Carbonated beverages (diabetics please choose diet or no sugar options), Clear Tea (No creamer, milk, or cream, including half & half and powdered creamer), Black Coffee Only (No creamer, milk or cream, including half & half and powdered creamer), Plain Jell-O Only (No red color; diabetics please choose no sugar options), Clear Sports drink (No red color; diabetics please choose diet or no sugar options), and Plain Popsicles Only (No red color; diabetics please choose no sugar options)        At 0700 on 01/04/2023 drink your carb drink. You can have nothing else to drink after this.     Use your inhaler before you come and bring your rescue inhaler with you.    Take these medicines the morning of surgery with A SIP OF WATER                                                  None.    Do not wear jewelry, make-up or nail polish, including gel polish,  artificial nails, or any other type of covering on natural nails (fingers and  toes).  Do not wear lotions, powders, or perfumes, or deodorant.  Do not shave 48 hours prior to surgery.  Men may shave face and neck.  Do not bring valuables to the hospital.  [facility] is not responsible for any belongings or valuables.  Contacts, dentures or bridgework may not be worn into surgery.   Leave your suitcase in the car.  After surgery it may be brought to your room.  For patients admitted to the hospital, discharge time will be determined by your treatment team.  Patients discharged the day of surgery will not be allowed to drive home and must have someone with them for 24 hours.    Special instructions:   DO NOT smoke tobacco or vape for 24 hours before your procedure.  Please read over the following fact sheets that you were given. Pain Booklet, Coughing and Deep Breathing, Blood Transfusion Information, Surgical Site Infection Prevention, Anesthesia Post-op Instructions, and Care and Recovery After Surgery        Total Laparoscopic Hysterectomy, Care After The following information offers guidance on how to care for yourself after your procedure. Your health care provider may also give you more specific instructions. If you have problems or questions, contact your health care provider. What can I expect after the procedure? After the procedure, it is common to have: Pain, bruising, and numbness around your incisions. Tiredness (fatigue).  Poor appetite. Less interest in sex. Vaginal discharge or bleeding. You will need to use a sanitary pad after this procedure. Feelings of sadness or other emotions. If your ovaries were also removed, it is also common to have symptoms of menopause, such as hot flashes, night sweats, and lack of sleep (insomnia). Follow these instructions at home: Medicines Take over-the-counter and prescription medicines only as told by your health care provider. Ask your health care provider if the medicine prescribed to you: Requires you to avoid driving or using machinery. Can cause constipation. You may need to take these actions to prevent or treat constipation: Drink enough fluid to keep your urine pale yellow. Take over-the-counter or prescription medicines. Eat foods that are high in fiber, such as beans, whole grains, and fresh fruits  and vegetables. Limit foods that are high in fat and processed sugars, such as fried or sweet foods. Incision care  Follow instructions from your health care provider about how to take care of your incisions. Make sure you: Wash your hands with soap and water for at least 20 seconds before and after you change your bandage (dressing). If soap and water are not available, use hand sanitizer. Change your dressing as told by your health care provider. Leave stitches (sutures), skin glue, or adhesive strips in place. These skin closures may need to stay in place for 2 weeks or longer. If adhesive strip edges start to loosen and curl up, you may trim the loose edges. Do not remove adhesive strips completely unless your health care provider tells you to do that. Check your incision areas every day for signs of infection. Check for: More redness, swelling, or pain. Fluid or blood. Warmth. Pus or a bad smell. Activity  Rest as told by your health care provider. Avoid sitting for a long time without moving. Get up to take short walks every 1-2 hours. This is important to improve blood flow and breathing. Ask for help if you feel weak or unsteady. Return to your normal activities as told by your health care provider. Ask your health care provider what activities are safe for you. Do not lift anything that is heavier than 10 lb (4.5 kg), or the limit that you are told, for one month after surgery or until your health care provider says that it is safe. If you were given a sedative during the procedure, it can affect you for several hours. Do not drive or operate machinery until your health care provider says that it is safe. Lifestyle Do not use any products that contain nicotine or tobacco. These products include cigarettes, chewing tobacco, and vaping devices, such as e-cigarettes. These can delay healing after surgery. If you need help quitting, ask your health care provider. Do not drink alcohol until  your health care provider approves. General instructions  Do not douche, use tampons, or have sex for at least 6 weeks, or as told by your health care provider. If you struggle with physical or emotional changes after your procedure, speak with your health care provider or a therapist. Do not take baths, swim, or use a hot tub until your health care provider approves. You may only be allowed to take showers for 2-3 weeks. Keep your dressing dry until your health care provider says it can be removed. Try to have someone at home with you for the first 1-2 weeks to help with your daily chores. Wear compression stockings as told by your health care provider. These stockings help to  prevent blood clots and reduce swelling in your legs. Keep all follow-up visits. This is important. Contact a health care provider if: You have any of these signs of infection: Chills or a fever. More redness, swelling, or pain around an incision. Fluid or blood coming from an incision. Warmth coming from an incision. Pus or a bad smell coming from an incision. An incision opens. You feel dizzy or light-headed. You have pain or bleeding when you urinate, or you are unable to urinate. You have abnormal vaginal discharge. You have pain that does not get better with medicine. Get help right away if: You have a fever and your symptoms suddenly get worse. You have severe abdominal pain. You have chest pain or shortness of breath. You faint. You have pain, swelling, or redness in your leg. You have heavy vaginal bleeding with blood clots, soaking through a sanitary pad in less than 1 hour. These symptoms may represent a serious problem that is an emergency. Do not wait to see if the symptoms will go away. Get medical help right away. Call your local emergency services (911 in the [place] Do not drive yourself to the hospital. Summary After the procedure, it is common to have pain and bruising around your incisions. Do  not take baths, swim, or use a hot tub until your health care provider approves. Do not lift anything that is heavier than 10 lb (4.5 kg), or the limit that you are told, for one month after surgery or until your health care provider says that it is safe. Tell your health care provider if you have any signs or symptoms of infection after the procedure. Get help right away if you have severe abdominal pain, chest pain, shortness of breath, or heavy bleeding from your vagina. This information is not intended to replace advice given to you by your health care provider. Make sure you discuss any questions you have with your health care provider. Document Revised: 01/09/2020 Document Reviewed: 01/10/2020 Elsevier Patient Education  2024 [employer] General Anesthesia, Adult, Care After The following information offers guidance on how to care for yourself after your procedure. Your health care provider may also give you more specific instructions. If you have problems or questions, contact your health care provider. What can I expect after the procedure? After the procedure, it is common for people to: Have pain or discomfort at the IV site. Have nausea or vomiting. Have a sore throat or hoarseness. Have trouble concentrating. Feel cold or chills. Feel weak, sleepy, or tired (fatigue). Have soreness and body aches. These can affect parts of the body that were not involved in surgery. Follow these instructions at home: For the time period you were told by your health care provider:  Rest. Do not participate in activities where you could fall or become injured. Do not drive or use machinery. Do not drink alcohol. Do not take sleeping pills or medicines that cause drowsiness. Do not make important decisions or sign legal documents. Do not take care of children on your own. General instructions Drink enough fluid to keep your urine pale yellow. If you have sleep apnea, surgery and certain  medicines can increase your risk for breathing problems. Follow instructions from your health care provider about wearing your sleep device: Anytime you are sleeping, including during daytime naps. While taking prescription pain medicines, sleeping medicines, or medicines that make you drowsy. Return to your normal activities as told by your health care provider. Ask your health care provider  what activities are safe for you. Take over-the-counter and prescription medicines only as told by your health care provider. Do not use any products that contain nicotine or tobacco. These products include cigarettes, chewing tobacco, and vaping devices, such as e-cigarettes. These can delay incision healing after surgery. If you need help quitting, ask your health care provider. Contact a health care provider if: You have nausea or vomiting that does not get better with medicine. You vomit every time you eat or drink. You have pain that does not get better with medicine. You cannot urinate or have bloody urine. You develop a skin rash. You have a fever. Get help right away if: You have trouble breathing. You have chest pain. You vomit blood. These symptoms may be an emergency. Get help right away. Call 911. Do not wait to see if the symptoms will go away. Do not drive yourself to the hospital. Summary After the procedure, it is common to have a sore throat, hoarseness, nausea, vomiting, or to feel weak, sleepy, or fatigue. For the time period you were told by your health care provider, do not drive or use machinery. Get help right away if you have difficulty breathing, have chest pain, or vomit blood. These symptoms may be an emergency. This information is not intended to replace advice given to you by your health care provider. Make sure you discuss any questions you have with your health care provider. Document Revised: 08/06/2021 Document Reviewed: 08/06/2021 Elsevier Patient Education  2024  [employer] How to Use Chlorhexidine Before Surgery Chlorhexidine gluconate (CHG) is a germ-killing (antiseptic) solution that is used to clean the skin. It can get rid of the bacteria that normally live on the skin and can keep them away for about 24 hours. To clean your skin with CHG, you may be given: A CHG solution to use in the shower or as part of a sponge bath. A prepackaged cloth that contains CHG. Cleaning your skin with CHG may help lower the risk for infection: While you are staying in the intensive care unit of the hospital. If you have a vascular access, such as a central line, to provide short-term or long-term access to your veins. If you have a catheter to drain urine from your bladder. If you are on a ventilator. A ventilator is a machine that helps you breathe by moving air in and out of your lungs. After surgery. What are the risks? Risks of using CHG include: A skin reaction. Hearing loss, if CHG gets in your ears and you have a perforated eardrum. Eye injury, if CHG gets in your eyes and is not rinsed out. The CHG product catching fire. Make sure that you avoid smoking and flames after applying CHG to your skin. Do not use CHG: If you have a chlorhexidine allergy or have previously reacted to chlorhexidine. On babies younger than 34 months of age. How to use CHG solution Use CHG only as told by your health care provider, and follow the instructions on the label. Use the full amount of CHG as directed. Usually, this is one bottle. During a shower Follow these steps when using CHG solution during a shower (unless your health care provider gives you different instructions): Start the shower. Use your normal soap and shampoo to wash your face and hair. Turn off the shower or move out of the shower stream. Pour the CHG onto a clean washcloth. Do not use any type of brush or rough-edged sponge. Starting at your  neck, lather your body down to your toes. Make sure you  follow these instructions: If you will be having surgery, pay special attention to the part of your body where you will be having surgery. Scrub this area for at least 1 minute. Do not use CHG on your head or face. If the solution gets into your ears or eyes, rinse them well with water. Avoid your genital area. Avoid any areas of skin that have broken skin, cuts, or scrapes. Scrub your back and under your arms. Make sure to wash skin folds. Let the lather sit on your skin for 1-2 minutes or as long as told by your health care provider. Thoroughly rinse your entire body in the shower. Make sure that all body creases and crevices are rinsed well. Dry off with a clean towel. Do not put any substances on your body afterward--such as powder, lotion, or perfume--unless you are told to do so by your health care provider. Only use lotions that are recommended by the manufacturer. Put on clean clothes or pajamas. If it is the night before your surgery, sleep in clean sheets.  During a sponge bath Follow these steps when using CHG solution during a sponge bath (unless your health care provider gives you different instructions): Use your normal soap and shampoo to wash your face and hair. Pour the CHG onto a clean washcloth. Starting at your neck, lather your body down to your toes. Make sure you follow these instructions: If you will be having surgery, pay special attention to the part of your body where you will be having surgery. Scrub this area for at least 1 minute. Do not use CHG on your head or face. If the solution gets into your ears or eyes, rinse them well with water. Avoid your genital area. Avoid any areas of skin that have broken skin, cuts, or scrapes. Scrub your back and under your arms. Make sure to wash skin folds. Let the lather sit on your skin for 1-2 minutes or as long as told by your health care provider. Using a different clean, wet washcloth, thoroughly rinse your entire body.  Make sure that all body creases and crevices are rinsed well. Dry off with a clean towel. Do not put any substances on your body afterward--such as powder, lotion, or perfume--unless you are told to do so by your health care provider. Only use lotions that are recommended by the manufacturer. Put on clean clothes or pajamas. If it is the night before your surgery, sleep in clean sheets. How to use CHG prepackaged cloths Only use CHG cloths as told by your health care provider, and follow the instructions on the label. Use the CHG cloth on clean, dry skin. Do not use the CHG cloth on your head or face unless your health care provider tells you to. When washing with the CHG cloth: Avoid your genital area. Avoid any areas of skin that have broken skin, cuts, or scrapes. Before surgery Follow these steps when using a CHG cloth to clean before surgery (unless your health care provider gives you different instructions): Using the CHG cloth, vigorously scrub the part of your body where you will be having surgery. Scrub using a back-and-forth motion for 3 minutes. The area on your body should be completely wet with CHG when you are done scrubbing. Do not rinse. Discard the cloth and let the area air-dry. Do not put any substances on the area afterward, such as powder, lotion, or perfume. Put  on clean clothes or pajamas. If it is the night before your surgery, sleep in clean sheets.  For general bathing Follow these steps when using CHG cloths for general bathing (unless your health care provider gives you different instructions). Use a separate CHG cloth for each area of your body. Make sure you wash between any folds of skin and between your fingers and toes. Wash your body in the following order, switching to a new cloth after each step: The front of your neck, shoulders, and chest. Both of your arms, under your arms, and your hands. Your stomach and groin area, avoiding the genitals. Your right  leg and foot. Your left leg and foot. The back of your neck, your back, and your buttocks. Do not rinse. Discard the cloth and let the area air-dry. Do not put any substances on your body afterward--such as powder, lotion, or perfume--unless you are told to do so by your health care provider. Only use lotions that are recommended by the manufacturer. Put on clean clothes or pajamas. Contact a health care provider if: Your skin gets irritated after scrubbing. You have questions about using your solution or cloth. You swallow any chlorhexidine. Call your local poison control center [phone] in the [place] Get help right away if: Your eyes itch badly, or they become very red or swollen. Your skin itches badly and is red or swollen. Your hearing changes. You have trouble seeing. You have swelling or tingling in your mouth or throat. You have trouble breathing. These symptoms may represent a serious problem that is an emergency. Do not wait to see if the symptoms will go away. Get medical help right away. Call your local emergency services (911 in the [place] Do not drive yourself to the hospital. Summary Chlorhexidine gluconate (CHG) is a germ-killing (antiseptic) solution that is used to clean the skin. Cleaning your skin with CHG may help to lower your risk for infection. You may be given CHG to use for bathing. It may be in a bottle or in a prepackaged cloth to use on your skin. Carefully follow your health care provider's instructions and the instructions on the product label. Do not use CHG if you have a chlorhexidine allergy. Contact your health care provider if your skin gets irritated after scrubbing. This information is not intended to replace advice given to you by your health care provider. Make sure you discuss any questions you have with your health care provider. Document Revised: 09/06/2021 Document Reviewed: 07/20/2020 Elsevier Patient Education  2023 [employer].

## 2023-01-01 ENCOUNTER — Other Ambulatory Visit: Payer: Self-pay | Admitting: Obstetrics & Gynecology

## 2023-01-01 DIAGNOSIS — Z01818 Encounter for other preprocedural examination: Secondary | ICD-10-CM

## 2023-01-02 ENCOUNTER — Encounter (HOSPITAL_COMMUNITY)
Admission: RE | Admit: 2023-01-02 | Discharge: 2023-01-02 | Disposition: A | Discharge status: Home / Self Care | Payer: 59 | Source: Ambulatory Visit | Attending: Obstetrics & Gynecology | Admitting: Obstetrics & Gynecology

## 2023-01-02 ENCOUNTER — Other Ambulatory Visit: Payer: Self-pay

## 2023-01-02 ENCOUNTER — Encounter (HOSPITAL_COMMUNITY): Payer: Self-pay

## 2023-01-02 DIAGNOSIS — Z01812 Encounter for preprocedural laboratory examination: Secondary | ICD-10-CM | POA: Diagnosis not present

## 2023-01-02 DIAGNOSIS — Z01818 Encounter for other preprocedural examination: Secondary | ICD-10-CM

## 2023-01-02 LAB — COMPREHENSIVE METABOLIC PANEL
ALT: 25 U/L (ref 0–44)
AST: 20 U/L (ref 15–41)
Albumin: 4.2 g/dL (ref 3.5–5.0)
Alkaline Phosphatase: 60 U/L (ref 38–126)
Anion gap: 11 (ref 5–15)
BUN: 19 mg/dL (ref 6–20)
CO2: 18 mmol/L — ABNORMAL LOW (ref 22–32)
Calcium: 8.4 mg/dL — ABNORMAL LOW (ref 8.9–10.3)
Chloride: 107 mmol/L (ref 98–111)
Creatinine, Ser: 1.1 mg/dL — ABNORMAL HIGH (ref 0.44–1.00)
GFR, Estimated: >60 mL/min (ref >60)
Glucose, Bld: 91 mg/dL (ref 70–99)
Potassium: 3.8 mmol/L (ref 3.5–5.1)
Sodium: 136 mmol/L (ref 135–145)
Total Bilirubin: 0.8 mg/dL (ref 0.3–1.2)
Total Protein: 7.4 g/dL (ref 6.5–8.1)

## 2023-01-02 LAB — URINALYSIS, ROUTINE W REFLEX MICROSCOPIC
Bilirubin Urine: NEGATIVE
Glucose, UA: NEGATIVE mg/dL
Ketones, ur: NEGATIVE mg/dL
Leukocytes,Ua: NEGATIVE
Nitrite: NEGATIVE
Protein, ur: NEGATIVE mg/dL
Specific Gravity, Urine: 1.019 (ref 1.005–1.030)
pH: 5 (ref 5.0–8.0)

## 2023-01-02 LAB — PREGNANCY, URINE: Preg Test, Ur: NEGATIVE

## 2023-01-02 LAB — CBC
HCT: 39.4 % (ref 36.0–46.0)
Hemoglobin: 13 g/dL (ref 12.0–15.0)
MCH: 31 pg (ref 26.0–34.0)
MCHC: 33 g/dL (ref 30.0–36.0)
MCV: 93.8 fL (ref 80.0–100.0)
Platelets: 215 10*3/uL (ref 150–400)
RBC: 4.2 MIL/uL (ref 3.87–5.11)
RDW: 12.7 % (ref 11.5–15.5)
WBC: 4.9 10*3/uL (ref 4.0–10.5)
nRBC: 0 % (ref 0.0–0.2)

## 2023-01-02 LAB — RAPID HIV SCREEN (HIV 1/2 AB+AG)
HIV 1/2 Antibodies: NONREACTIVE
HIV-1 P24 Antigen - HIV24: NONREACTIVE

## 2023-01-03 ENCOUNTER — Ambulatory Visit: Payer: 59 | Admitting: Obstetrics & Gynecology

## 2023-01-03 ENCOUNTER — Other Ambulatory Visit (HOSPITAL_COMMUNITY)
Admission: RE | Admit: 2023-01-03 | Discharge: 2023-01-03 | Disposition: A | Discharge status: Home / Self Care | Payer: 59 | Source: Ambulatory Visit | Attending: Obstetrics & Gynecology | Admitting: Obstetrics & Gynecology

## 2023-01-03 ENCOUNTER — Encounter: Payer: Self-pay | Admitting: Obstetrics & Gynecology

## 2023-01-03 VITALS — BP 128/80 | HR 69 | Ht 64.0 in | Wt 208.0 lb

## 2023-01-03 DIAGNOSIS — N946 Dysmenorrhea, unspecified: Secondary | ICD-10-CM

## 2023-01-03 DIAGNOSIS — N921 Excessive and frequent menstruation with irregular cycle: Secondary | ICD-10-CM | POA: Insufficient documentation

## 2023-01-03 DIAGNOSIS — D500 Iron deficiency anemia secondary to blood loss (chronic): Secondary | ICD-10-CM

## 2023-01-03 DIAGNOSIS — Z01818 Encounter for other preprocedural examination: Secondary | ICD-10-CM

## 2023-01-03 NOTE — Addendum Note (Signed)
Addended by: Federico Flake A on: 01/03/2023 10:32 AM   Modules accepted: Orders

## 2023-01-03 NOTE — Progress Notes (Signed)
Endometrial Biopsy Procedure Note  Pre-operative Diagnosis: menometrorrhagia with significant resulting anemia                                            Pre operative evaluation of endometrium Post-operative Diagnosis: same  Indications: as above  Procedure Details   Urine pregnancy test was not done.  The risks (including infection, bleeding, pain, and uterine perforation) and benefits of the procedure were explained to the patient and Written informed consent was obtained.  Antibiotic prophylaxis against endocarditis was not indicated.   The patient was placed in the dorsal lithotomy position.  Bimanual exam showed the uterus to be in the neutral position.  A Graves' speculum inserted in the vagina, and the cervix prepped with povidone iodine.  Endocervical curettage with a Kevorkian curette was not performed.   A sharp tenaculum was applied to the anterior lip of the cervix for stabilization.  A sterile uterine sound was used to sound the uterus to a depth of 6.5 cm.  A Pipelle endometrial aspirator was used to sample the endometrium.  Sample was sent for pathologic examination.  Condition: Stable  Complications: None  Plan:  The patient was advised to call for any fever or for prolonged or severe pain or bleeding. She was advised to use OTC analgesics as needed for mild to moderate pain. She was advised to avoid vaginal intercourse for 48 hours or until the bleeding has completely stopped.  Attending Physician Documentation: I was present for or performed the following: endometrial biopsy

## 2023-01-04 ENCOUNTER — Encounter (HOSPITAL_COMMUNITY): Admission: RE | Payer: Self-pay | Source: Home / Self Care

## 2023-01-04 ENCOUNTER — Ambulatory Visit (HOSPITAL_COMMUNITY): Admission: RE | Admit: 2023-01-04 | Payer: 59 | Source: Home / Self Care | Admitting: Obstetrics & Gynecology

## 2023-01-04 SURGERY — HYSTERECTOMY, TOTAL, ROBOT-ASSISTED
Anesthesia: General

## 2023-01-06 ENCOUNTER — Encounter: Payer: Self-pay | Admitting: Obstetrics & Gynecology

## 2023-01-10 ENCOUNTER — Encounter (HOSPITAL_COMMUNITY)
Admission: RE | Admit: 2023-01-10 | Discharge: 2023-01-10 | Disposition: A | Discharge status: Home / Self Care | Payer: 59 | Source: Ambulatory Visit | Attending: Obstetrics & Gynecology | Admitting: Obstetrics & Gynecology

## 2023-01-10 ENCOUNTER — Other Ambulatory Visit: Payer: Self-pay

## 2023-01-10 ENCOUNTER — Encounter (HOSPITAL_COMMUNITY): Payer: Self-pay

## 2023-01-11 ENCOUNTER — Encounter (HOSPITAL_COMMUNITY): Payer: Self-pay | Admitting: Obstetrics & Gynecology

## 2023-01-11 ENCOUNTER — Ambulatory Visit (HOSPITAL_BASED_OUTPATIENT_CLINIC_OR_DEPARTMENT_OTHER): Payer: 59 | Admitting: Certified Registered"

## 2023-01-11 ENCOUNTER — Encounter (HOSPITAL_COMMUNITY)
Admission: RE | Disposition: A | Discharge status: Home / Self Care | Payer: Self-pay | Source: Home / Self Care | Attending: Obstetrics & Gynecology

## 2023-01-11 ENCOUNTER — Ambulatory Visit (HOSPITAL_COMMUNITY): Payer: 59 | Admitting: Certified Registered"

## 2023-01-11 ENCOUNTER — Ambulatory Visit (HOSPITAL_COMMUNITY)
Admission: RE | Admit: 2023-01-11 | Discharge: 2023-01-11 | Disposition: A | Discharge status: Home / Self Care | Payer: 59 | Attending: Obstetrics & Gynecology | Admitting: Obstetrics & Gynecology

## 2023-01-11 ENCOUNTER — Other Ambulatory Visit: Payer: Self-pay

## 2023-01-11 DIAGNOSIS — N924 Excessive bleeding in the premenopausal period: Secondary | ICD-10-CM

## 2023-01-11 DIAGNOSIS — N946 Dysmenorrhea, unspecified: Secondary | ICD-10-CM

## 2023-01-11 DIAGNOSIS — N921 Excessive and frequent menstruation with irregular cycle: Secondary | ICD-10-CM | POA: Diagnosis present

## 2023-01-11 DIAGNOSIS — F17210 Nicotine dependence, cigarettes, uncomplicated: Secondary | ICD-10-CM | POA: Diagnosis not present

## 2023-01-11 DIAGNOSIS — D649 Anemia, unspecified: Secondary | ICD-10-CM | POA: Insufficient documentation

## 2023-01-11 DIAGNOSIS — N888 Other specified noninflammatory disorders of cervix uteri: Secondary | ICD-10-CM | POA: Diagnosis not present

## 2023-01-11 DIAGNOSIS — D259 Leiomyoma of uterus, unspecified: Secondary | ICD-10-CM | POA: Diagnosis not present

## 2023-01-11 HISTORY — PX: ROBOTIC ASSISTED TOTAL HYSTERECTOMY WITH BILATERAL SALPINGO OOPHERECTOMY: SHX6086

## 2023-01-11 LAB — POCT PREGNANCY, URINE: Preg Test, Ur: NEGATIVE

## 2023-01-11 SURGERY — HYSTERECTOMY, TOTAL, ROBOT-ASSISTED, LAPAROSCOPIC, WITH BILATERAL SALPINGO-OOPHORECTOMY
Anesthesia: General | Site: Abdomen | Laterality: Bilateral

## 2023-01-11 MED ORDER — FENTANYL CITRATE (PF) 100 MCG/2ML IJ SOLN
INTRAMUSCULAR | Status: DC | PRN
  Administered 2023-01-11: 100 ug via INTRAVENOUS
  Administered 2023-01-11 (×2): 50 ug via INTRAVENOUS

## 2023-01-11 MED ORDER — ROCURONIUM BROMIDE 10 MG/ML (PF) SYRINGE
PREFILLED_SYRINGE | INTRAVENOUS | Status: AC
  Filled 2023-01-11: qty 10

## 2023-01-11 MED ORDER — DIPHENHYDRAMINE HCL 50 MG/ML IJ SOLN
INTRAMUSCULAR | Status: AC
  Filled 2023-01-11: qty 1

## 2023-01-11 MED ORDER — BUPIVACAINE HCL (PF) 0.5 % IJ SOLN
INTRAMUSCULAR | Status: AC
  Filled 2023-01-11: qty 60

## 2023-01-11 MED ORDER — MIDAZOLAM HCL 2 MG/2ML IJ SOLN
INTRAMUSCULAR | Status: AC
  Filled 2023-01-11: qty 2

## 2023-01-11 MED ORDER — OXYCODONE HCL 5 MG PO TABS: 5.0000 mg | ORAL_TABLET | Freq: Once | ORAL | Status: DC | PRN

## 2023-01-11 MED ORDER — LIDOCAINE HCL (CARDIAC) PF 100 MG/5ML IV SOSY
PREFILLED_SYRINGE | INTRAVENOUS | Status: DC | PRN
  Administered 2023-01-11: 80 mg via INTRATRACHEAL

## 2023-01-11 MED ORDER — KETOROLAC TROMETHAMINE 10 MG PO TABS: 10.0000 mg | ORAL_TABLET | Freq: Three times a day (TID) | ORAL | 0 refills | Status: DC | PRN

## 2023-01-11 MED ORDER — CEFAZOLIN SODIUM-DEXTROSE 2-4 GM/100ML-% IV SOLN
2.0000 g | INTRAVENOUS | Status: AC
  Administered 2023-01-11: 2 g via INTRAVENOUS
  Filled 2023-01-11: qty 100

## 2023-01-11 MED ORDER — FENTANYL CITRATE (PF) 100 MCG/2ML IJ SOLN
INTRAMUSCULAR | Status: AC
  Filled 2023-01-11: qty 2

## 2023-01-11 MED ORDER — SUGAMMADEX SODIUM 200 MG/2ML IV SOLN
INTRAVENOUS | Status: DC | PRN
  Administered 2023-01-11: 300 mg via INTRAVENOUS

## 2023-01-11 MED ORDER — CHLORHEXIDINE GLUCONATE 0.12 % MT SOLN: 15.0000 mL | Freq: Once | OROMUCOSAL | Status: DC

## 2023-01-11 MED ORDER — ONDANSETRON HCL 4 MG/2ML IJ SOLN
INTRAMUSCULAR | Status: DC | PRN
Start: 2023-01-11 — End: 2023-01-11
  Administered 2023-01-11: 8 mg via INTRAVENOUS

## 2023-01-11 MED ORDER — LACTATED RINGERS IV SOLN: INTRAVENOUS | Status: DC

## 2023-01-11 MED ORDER — DIPHENHYDRAMINE HCL 50 MG/ML IJ SOLN
INTRAMUSCULAR | Status: DC | PRN
Start: 2023-01-11 — End: 2023-01-11
  Administered 2023-01-11: 12.5 mg via INTRAVENOUS

## 2023-01-11 MED ORDER — KETOROLAC TROMETHAMINE 30 MG/ML IJ SOLN
30.0000 mg | Freq: Once | INTRAMUSCULAR | Status: AC
  Administered 2023-01-11: 30 mg via INTRAVENOUS
  Filled 2023-01-11: qty 1

## 2023-01-11 MED ORDER — STERILE WATER FOR IRRIGATION IR SOLN
Status: DC | PRN
  Administered 2023-01-11: 1000 mL

## 2023-01-11 MED ORDER — HYDROMORPHONE HCL 1 MG/ML IJ SOLN
INTRAMUSCULAR | Status: DC | PRN
  Administered 2023-01-11 (×2): .5 mg via INTRAVENOUS

## 2023-01-11 MED ORDER — PROPOFOL 10 MG/ML IV BOLUS
INTRAVENOUS | Status: AC
  Filled 2023-01-11: qty 20

## 2023-01-11 MED ORDER — CHLORHEXIDINE GLUCONATE 0.12 % MT SOLN
15.0000 mL | Freq: Once | OROMUCOSAL | Status: AC
  Administered 2023-01-11: 15 mL via OROMUCOSAL

## 2023-01-11 MED ORDER — ONDANSETRON 8 MG PO TBDP: 8.0000 mg | ORAL_TABLET | Freq: Three times a day (TID) | ORAL | 0 refills | Status: DC | PRN

## 2023-01-11 MED ORDER — PHENYLEPHRINE 80 MCG/ML (10ML) SYRINGE FOR IV PUSH (FOR BLOOD PRESSURE SUPPORT)
PREFILLED_SYRINGE | INTRAVENOUS | Status: AC
  Filled 2023-01-11: qty 10

## 2023-01-11 MED ORDER — SCOPOLAMINE 1 MG/3DAYS TD PT72: 1.0000 | MEDICATED_PATCH | Freq: Once | TRANSDERMAL | Status: DC

## 2023-01-11 MED ORDER — FENTANYL CITRATE PF 50 MCG/ML IJ SOSY
25.0000 ug | PREFILLED_SYRINGE | INTRAMUSCULAR | Status: DC | PRN
  Administered 2023-01-11 (×2): 50 ug via INTRAVENOUS
  Filled 2023-01-11 (×2): qty 1

## 2023-01-11 MED ORDER — MIDAZOLAM HCL 2 MG/2ML IJ SOLN
INTRAMUSCULAR | Status: DC | PRN
  Administered 2023-01-11: 2 mg via INTRAVENOUS

## 2023-01-11 MED ORDER — POVIDONE-IODINE 10 % EX SWAB
2.0000 | Freq: Once | CUTANEOUS | Status: AC
  Administered 2023-01-11: 2 via TOPICAL

## 2023-01-11 MED ORDER — ROCURONIUM BROMIDE 10 MG/ML (PF) SYRINGE
PREFILLED_SYRINGE | INTRAVENOUS | Status: DC | PRN
  Administered 2023-01-11: 50 mg via INTRAVENOUS
  Administered 2023-01-11: 70 mg via INTRAVENOUS

## 2023-01-11 MED ORDER — OXYCODONE HCL 5 MG/5ML PO SOLN: 5.0000 mg | Freq: Once | ORAL | Status: DC | PRN

## 2023-01-11 MED ORDER — ONDANSETRON HCL 4 MG/2ML IJ SOLN
INTRAMUSCULAR | Status: AC
  Filled 2023-01-11: qty 4

## 2023-01-11 MED ORDER — LIDOCAINE HCL (PF) 2 % IJ SOLN
INTRAMUSCULAR | Status: AC
  Filled 2023-01-11: qty 5

## 2023-01-11 MED ORDER — PROPOFOL 10 MG/ML IV BOLUS
INTRAVENOUS | Status: DC | PRN
  Administered 2023-01-11: 180 mg via INTRAVENOUS

## 2023-01-11 MED ORDER — BUPIVACAINE HCL (PF) 0.5 % IJ SOLN
INTRAMUSCULAR | Status: DC | PRN
Start: 2023-01-11 — End: 2023-01-11
  Administered 2023-01-11: 40 mL

## 2023-01-11 MED ORDER — ONDANSETRON HCL 4 MG/2ML IJ SOLN: 4.0000 mg | Freq: Once | INTRAMUSCULAR | Status: DC | PRN

## 2023-01-11 MED ORDER — ORAL CARE MOUTH RINSE: 15.0000 mL | Freq: Once | OROMUCOSAL | Status: DC

## 2023-01-11 MED ORDER — HYDROMORPHONE HCL 1 MG/ML IJ SOLN
INTRAMUSCULAR | Status: AC
  Filled 2023-01-11: qty 1

## 2023-01-11 MED ORDER — OXYCODONE-ACETAMINOPHEN 7.5-325 MG PO TABS: 1.0000 | ORAL_TABLET | Freq: Four times a day (QID) | ORAL | 0 refills | Status: DC | PRN

## 2023-01-11 MED ORDER — CIPROFLOXACIN HCL 500 MG PO TABS: 500.0000 mg | ORAL_TABLET | Freq: Two times a day (BID) | ORAL | 0 refills | Status: DC

## 2023-01-11 SURGICAL SUPPLY — 53 items
ADH SKN CLS APL DERMABOND .7 (GAUZE/BANDAGES/DRESSINGS) ×1
ANTIFOG SOL W/FOAM PAD STRL (MISCELLANEOUS) ×1
BLADE SURG SZ11 CARB STEEL (BLADE) ×1 IMPLANT
CAUTERY HOOK MNPLR 1.6 DVNC XI (INSTRUMENTS) ×1 IMPLANT
COVER LIGHT HANDLE STERIS (MISCELLANEOUS) ×2 IMPLANT
COVER MAYO STAND XLG (MISCELLANEOUS) ×1 IMPLANT
DEFOGGER SCOPE WARMER CLEARIFY (MISCELLANEOUS) IMPLANT
DERMABOND ADVANCED .7 DNX12 (GAUZE/BANDAGES/DRESSINGS) ×1 IMPLANT
DRAPE ARM DVNC X/XI (DISPOSABLE) ×4 IMPLANT
DRAPE COLUMN DVNC XI (DISPOSABLE) ×1 IMPLANT
DRIVER NDL MEGA SUTCUT DVNCXI (INSTRUMENTS) ×1 IMPLANT
DRIVER NDLE MEGA SUTCUT DVNCXI (INSTRUMENTS) ×1
ELECT REM PT RETURN 9FT ADLT (ELECTROSURGICAL) ×1
ELECTRODE REM PT RTRN 9FT ADLT (ELECTROSURGICAL) ×1 IMPLANT
FORCEPS PROGRASP DVNC XI (FORCEP) IMPLANT
GAUZE 4X4 16PLY ~~LOC~~+RFID DBL (SPONGE) ×2 IMPLANT
GLOVE BIOGEL PI IND STRL 7.0 (GLOVE) ×4 IMPLANT
GLOVE BIOGEL PI IND STRL 8 (GLOVE) ×2 IMPLANT
GLOVE ECLIPSE 8.0 STRL XLNG CF (GLOVE) ×3 IMPLANT
GOWN STRL REUS W/TWL LRG LVL3 (GOWN DISPOSABLE) ×2 IMPLANT
GOWN STRL REUS W/TWL XL LVL3 (GOWN DISPOSABLE) ×2 IMPLANT
GYRUS RUMI II 3.5CM BLUE (DISPOSABLE) ×1
KIT PINK PAD W/HEAD ARE REST (MISCELLANEOUS) ×1
KIT PINK PAD W/HEAD ARM REST (MISCELLANEOUS) ×1 IMPLANT
MANIFOLD NEPTUNE II (INSTRUMENTS) ×1 IMPLANT
NDL HYPO 21X1.5 SAFETY (NEEDLE) ×1 IMPLANT
NDL INSUFFLATION 14GA 120MM (NEEDLE) ×1 IMPLANT
NEEDLE HYPO 21X1.5 SAFETY (NEEDLE) ×1
NEEDLE INSUFFLATION 14GA 120MM (NEEDLE) ×1
NS IRRIG 500ML POUR BTL (IV SOLUTION) ×1 IMPLANT
OBTURATOR OPTICAL STND 8 DVNC (TROCAR) ×1
OBTURATOR OPTICALSTD 8 DVNC (TROCAR) ×1 IMPLANT
PACK PERI GYN (CUSTOM PROCEDURE TRAY) ×1 IMPLANT
POSITIONER HEAD 8X9X4 ADT (SOFTGOODS) ×1 IMPLANT
RUMI II GYRUS 3.5CM BLUE (DISPOSABLE) IMPLANT
SEAL UNIV 5-12 XI (MISCELLANEOUS) ×3 IMPLANT
SEALER VESSEL EXT DVNC XI (MISCELLANEOUS) ×1 IMPLANT
SET BASIN LINEN APH (SET/KITS/TRAYS/PACK) ×1 IMPLANT
SET TUBE DA VINCI INSUFFLATOR (TUBING) IMPLANT
SOLUTION ANTFG W/FOAM PAD STRL (MISCELLANEOUS) ×1 IMPLANT
SPONGE T-LAP 18X18 ~~LOC~~+RFID (SPONGE) ×1 IMPLANT
SUT STRATAFIX 0 PDS+ CT-2 23 (SUTURE) ×1
SUT VICRYL 0 AB UR-6 (SUTURE) ×1 IMPLANT
SUT VICRYL AB 3-0 FS1 BRD 27IN (SUTURE) ×1 IMPLANT
SUTURE STRATFX 0 PDS+ CT-2 23 (SUTURE) ×1 IMPLANT
SYR 10ML LL (SYRINGE) ×2 IMPLANT
SYR 20ML LL LF (SYRINGE) ×1 IMPLANT
SYR 50ML LL SCALE MARK (SYRINGE) ×1 IMPLANT
SYR CONTROL 10ML LL (SYRINGE) ×2 IMPLANT
TIP UTERINE 6.7X6CM WHT DISP (MISCELLANEOUS) IMPLANT
TRAY FOL W/BAG SLVR 16FR STRL (SET/KITS/TRAYS/PACK) ×1 IMPLANT
TRAY FOLEY W/BAG SLVR 16FR LF (SET/KITS/TRAYS/PACK) ×1
WATER STERILE IRR 500ML POUR (IV SOLUTION) ×1 IMPLANT

## 2023-01-11 NOTE — Anesthesia Procedure Notes (Signed)
Procedure Name: Intubation Date/Time: 01/11/2023 11:35 AM  Performed by: Oletha Cruel, CRNAPre-anesthesia Checklist: Patient identified, Emergency Drugs available, Suction available and Patient being monitored Patient Re-evaluated:Patient Re-evaluated prior to induction Oxygen Delivery Method: Circle system utilized Preoxygenation: Pre-oxygenation with 100% oxygen Induction Type: IV induction Ventilation: Mask ventilation without difficulty Laryngoscope Size: Mac and 4 Grade View: Grade II Tube type: Oral Tube size: 7.0 mm Number of attempts: 1 Airway Equipment and Method: Stylet Placement Confirmation: ETT inserted through vocal cords under direct vision, positive ETCO2, CO2 detector and breath sounds checked- equal and bilateral Secured at: 22 (22cm at upper lip.) cm Tube secured with: Tape Dental Injury: Teeth and Oropharynx as per pre-operative assessment

## 2023-01-11 NOTE — Transfer of Care (Addendum)
Immediate Anesthesia Transfer of Care Note  Patient: Debbie Young  Procedure(s) Performed: XI ROBOTIC ASSISTED TOTAL HYSTERECTOMY WITH BILATERAL SALPINGO OOPHORECTOMY (Bilateral: Abdomen)  Patient Location: PACU  Anesthesia Type:General  Level of Consciousness: awake and patient cooperative  Airway & Oxygen Therapy: Patient Spontanous Breathing and Patient connected to face mask oxygen  Post-op Assessment: Report given to RN and Post -op Vital signs reviewed and stable  Post vital signs: Reviewed and stable  Last Vitals:  Vitals Value Taken Time  BP 120/79 01/11/23 1434  Temp 98.2 01/11/23   1434  Pulse 67 01/11/23 1437  Resp 17 01/11/23 1437  SpO2 100 % 01/11/23 1437  Vitals shown include unfiled device data.  Last Pain:  Vitals:   01/11/23 0956  PainSc: 0-No pain         Complications: No notable events documented.

## 2023-01-11 NOTE — Op Note (Signed)
Preoperative diagnosis: Menometrorrhagia Anemia requiring transfusion   Postoperative diagnosis: SAA   Procedure: Xi Robotic hysterectomy with removal of both tubes and ovaries  Laparoscopic guided transversus abdominus plane block   Surgeon: Lazaro Arms, MD   Anesthesia: General endotracheal   Findings: Adhesions of bladder to anterior lower uterine segment No other abnormalities   Description of operation: Patient was taken to the operating room and placed in the low lithotomy position She was prepped and draped in the usual sterile fashion robotic assisted laparoscopic procedure A Foley catheter was placed The vagina was once again prepped additionally   A RUMI II 6 cm with 3.5 cervical cup was placed for uterine manipulation and colpotomy delineation   An incision was made above the umbilicus The umbilical fascia was grasped A varies needle was used and placed into the peritoneum with 1 pass A pneumoperitoneum was created to a pressure of 15   An 8 mm port was placed into the peritoneal cavity using a nonbladed trocar easily with 1 pass using the video laparoscope The peritoneal cavity was confirmed   There were no unusual findings Minor congenital adhesions of the ascending colon to the right pelvic sidewall   3 additional 8 mm ports were placed at approximately the same level as the supraumbilical port 3 of the ports were robotic and 1 is an assist port   One was left lateral and 1 was placed right lateral, both lateral to the rectus anterior muscle These were placed under direct visualization without difficulty into the peritoneal cavity Nonbladed trocars were used in all instances    The patient was placed in 24 degrees of Trendelenburg   The [employer] robot was then docked to the 3 robotic ports   Instruments used during the robotic hysterectomy: Vessel sealer extender using bipolar energy ProGrasp forceps with no energy Monopolar hook Megacut  needle driver 2-0 PDS symmetrical STRATAFIX on a CT 2 needle   I then left the patient and went to the surgical console   The RUMI II  was used throughout the case to apply traction and anterior/posterior displacement of the uterus to facilitate the robotic procedure The ProGrasp was used to put traction on the left adnexa The left ureter was identified and found to be well away from the adnexal vessels The vessel sealer extender using bipolar energy was used and the utero-ovarian ligament was coagulated and then transected I used traction medially and anteriorly and used the vessel sealer to take the broad ligament and round ligament down to the level of the cervical isthmus just lateral to the left uterine vessels   I then turned my attention to the right adnexa The pro grasp was used and medial and upward traction was placed The vessel sealer extender using bipolar energy was used to coagulate and ligate the right infundibulopelvic ligament vessels Again the right ureter was well inferior to the vessels I continued use medial and anterior retraction using the ProGrasp and the vessel sealer with the bipolar energy was used to take the broad ligament and round ligament down to the level of the cervical isthmus and lateral to the uterine vessels   I then placed the monopolar hook in the place of the ProGrasp The vessel sealer extender was used to grasp the peritoneum of the bladder provide traction, of course no energy was used for this I used the monopolar hook with energy to open of the peritoneal leaf anteriorly and dissect the bladder peritoneum off  the lower uterine segment and cervix beyond the level of the vaginal colpotomy cup I then used the monopolar hook to take the peritoneum down where I stopped using the vessel sealer and met in the bladder dissection bilaterally   The vessel sealer extender with monopolar energy was used to coagulate the uterine vessels at the level of the  cervical isthmus bilaterally and then just above and just below to manage backbleeding The uterine vessels were transected There was good hemostasis I then stayed medial to this uterine vessel pedicle with the vessel sealer extender and took down the paracervical tissue to allow colpotomy incision using the monopolar hook, preserving the cardinal ligament Again there was good hemostasis bilaterally   I then used the monopolar hook and made a posterior colpotomy incision above the level of insertion of the uterosacral ligaments I used a frowny face then smiley face technique and opened the vagina to approximately 8:00 and 4:00 posteriorly I then used the vessel sealer extender with bipolar energy, coagulating and then transecting the vagina   The monopolar hook was used to make the anterior colpotomy incision as the bladder had been dissected well past the colpotomy ring Cephalad tension was placed on the Vcare handle throughout this portion of the procedure to prevent thermal injury to the bladder and safe distance from the ureters bilaterally Colpotomy incision was made from 10:00 to 2:00 The vessel sealer with bipolar energy was then used to coagulate and transect the vagina, again inside of the cardinal ligament  Pedicles for the Tubes and ovaries were also coagulated and removed   The uterus was removed through the vagina   The tubes and ovaries were also removed through the vagina A wet lap tape was placed in the vagina to maintain pneumoperitoneum   All pedicles were found to be hemostatic there was very little blood loss I then removed the vessel sealer and monopolar hook The pro grasp was replaced and the needle driver was also placed along with the suture   The vagina was closed using the 2-0 PDS STRATAFIX symmetrical suture on the CT 2 needle I pay close attention to the vaginal corners bilaterally and incorporated those in the closure 1.5 cm of vagina was operating to the  vaginal closure I also fixed the uterosacral ligaments to the vaginal cuff repair shortening the uterosacral ligaments thus providing improved vaginal vault suspension The cardinal ligaments were intact again improving postoperative vaginal support Pubocervical rectovaginal and posterior peritoneum were all included in the vaginal closure There was good result hemostasis   At the end of the procedure all the pedicles were identified and found to be hemostatic Both ureters were identified and undergoing normal peristalsis, they were well out of the way of surgical field throughout   I then got up from the console and went back to the side of the patient after gowning and gloving sterilely  A transversus abdominus plane block was placed using laparoscopic guidance at T10 and T7 bilaterally, 10 cc of 0.5% ropivicaine  at each site, 40 cc total of 200 mg of ropivicaine.  Doyle's bubble technique was used.       The  robotic ports were removed The insufflation ports were used to actively desufflate the peritoneal cavity to a pressure of 0 Ports were then removed   The subcutaneous tissue of all 4 incisions was closed using 0 Vicryl All 5 skin incisions were closed using 3-0 vicryl in a subcuticular manner Dermabond was used all 3  incision sites  Each incision was dressed   The patient tolerated the procedure well She received 2 g of Ancef and 30 mg of Toradol preoperatively   EBL: 10 cc   Lazaro Arms, MD 01/11/2023 2:49 PM

## 2023-01-11 NOTE — Progress Notes (Signed)
Awake. O2 sat 92-94% on room air. Instructed on incentive spirometer. 1250 ml obtained. Tolerated well.

## 2023-01-11 NOTE — Anesthesia Preprocedure Evaluation (Signed)
Anesthesia Evaluation  Patient identified by MRN, date of birth, ID band Patient awake    Reviewed: Allergy & Precautions, H&P , NPO status , Patient's Chart, lab work & pertinent test results, reviewed documented beta blocker date and time   Airway Mallampati: II  TM Distance: >3 FB Neck ROM: full    Dental no notable dental hx.    Pulmonary neg pulmonary ROS, asthma , Current Smoker   Pulmonary exam normal breath sounds clear to auscultation       Cardiovascular Exercise Tolerance: Good negative cardio ROS  Rhythm:regular Rate:Normal     Neuro/Psych   Anxiety      negative psych ROS   GI/Hepatic negative GI ROS, Neg liver ROS,,,  Endo/Other  negative endocrine ROS    Renal/GU negative Renal ROS  negative genitourinary   Musculoskeletal   Abdominal   Peds  Hematology negative hematology ROS (+) Blood dyscrasia, anemia   Anesthesia Other Findings   Reproductive/Obstetrics negative OB ROS                             Anesthesia Physical Anesthesia Plan  ASA: 2  Anesthesia Plan: General and General ETT   Post-op Pain Management:    Induction:   PONV Risk Score and Plan: Scopolamine patch - Pre-op and Ondansetron  Airway Management Planned:   Additional Equipment:   Intra-op Plan:   Post-operative Plan:   Informed Consent: I have reviewed the patients History and Physical, chart, labs and discussed the procedure including the risks, benefits and alternatives for the proposed anesthesia with the patient or authorized representative who has indicated his/her understanding and acceptance.     Dental Advisory Given  Plan Discussed with: CRNA  Anesthesia Plan Comments:        Anesthesia Quick Evaluation

## 2023-01-11 NOTE — H&P (Signed)
Preoperative History and Physical  Debbie Young is a 52 y.o. [id number] with Patient's last menstrual period was 08/03/2022 (approximate). admitted for a RA TLH BSO.   Note from 07/25/22: Debbie Young is a 52 year old white female,married, [id number], in complaining of vaginal bleeding for 5 weeks now, has had large clots, feels tired and short of breath with exertion.  Bleeding not as heavy or passing clots. She did not work Saturday or Sunday.  She had [facility] 07/21/22 of 6.4 that she requested, thinking it was menopause related. She said her mom had hysterectomy in her 23's.    Last pap was negative HPV,NILM 10/15/20   PCP is Dr Tanya Nones   Review of Systems Vaginal bleeding for 5 weeks with clots     +tired +short of breath with exertion Reviewed past medical,surgical, social and family history. Reviewed medications and allergies.   Sonogram normal 131 cc/ovaries normal  Has received 2 units PRBC Responded to megestrol but feels awful on it, including diminished sex drive, weight gain, mood swings She does not want IUD and wants definitive surgical management  PMH:    Past Medical History:  Diagnosis Date   Anxiety    Asthma    history of asthma as child   Cellulitis 04/2001   LEFT FOOT   Diverticulitis    Hematuria    BENIGN--SAW UROLOGIST   History of abnormal cervical Pap smear 04/30/2015   LGSIL (low grade squamous intraepithelial dysplasia) 1997   Miscarriage 08/02/2013   Patient desires pregnancy 11/18/2013   Seizure disorder (HCC)    AS A CHILD--NO MEDS SINCE 16 YO   Smoker    Tobacco use    Vaginal Pap smear, abnormal     PSH:     Past Surgical History:  Procedure Laterality Date   BUNIONECTOMY Bilateral 1988, 1990   CERVICAL BIOPSY  W/ LOOP ELECTRODE EXCISION  07/22/2002   CESAREAN SECTION     CESAREAN SECTION WITH BILATERAL TUBAL LIGATION Bilateral 03/09/2017   Procedure: REPEAT CESAREAN SECTION WITH BILATERAL TUBAL LIGATION;  Surgeon: Tilda Burrow, MD;  Location:  [facility];  Service: Obstetrics;  Laterality: Bilateral;   CHOLECYSTECTOMY     COLPOSCOPY     KNEE ARTHROSCOPY Right 04/26/2018   Procedure: ARTHROSCOPY KNEE with removal of ganglion of Anterior Cruciate Ligament;  Surgeon: Vickki Hearing, MD;  Location: [facility];  Service: Orthopedics;  Laterality: Right;   REFRACTIVE SURGERY  05/23/1998   TONSILLECTOMY AND ADENOIDECTOMY      POb/GynH:      OB History     Gravida  4   Para  2   Term  1   Preterm  1   AB  2   Living  2      SAB  2   IAB      Ectopic      Multiple  0   Live Births  2           SH:   Social History   Tobacco Use   Smoking status: Every Day    Current packs/day: 0.75    Average packs/day: 0.8 packs/day for 20.0 years (15.0 ttl pk-yrs)    Types: Cigarettes   Smokeless tobacco: Never  Vaping Use   Vaping status: Some Days  Substance Use Topics   Alcohol use: Yes    Comment: social drink   Drug use: No    FH:    Family History  Problem Relation Age of Onset  Breast cancer Mother 50   Fibromyalgia Mother    Von Willebrand disease Mother    Stroke Mother    Hyperlipidemia Mother    Hypertension Mother    Breast cancer Maternal Aunt    Breast cancer Maternal Grandmother    Heart disease Maternal Grandmother    Anuerysm Maternal Grandmother    Heart failure Maternal Grandmother    Hypertension Maternal Grandfather    Stroke Maternal Grandfather    Heart disease Paternal Grandmother    Heart disease Paternal Grandfather    Cancer Father        Myloma and Mylodysplasia - [place]    Asthma Son      Allergies:  Allergies  Allergen Reactions   Prednisone Other (See Comments)    Paranoia    Medications:       Current Facility-Administered Medications:    ceFAZolin (ANCEF) IVPB 2g/100 mL premix, 2 g, Intravenous, On Call to OR, Kline Bulthuis, Amaryllis Dyke, MD   lactated ringers infusion, , Intravenous, Continuous, Kiel, Mosetta Putt, MD, Last Rate: 10 mL/hr at 01/11/23  1001, New Bag at 01/11/23 1001  Review of Systems:   Review of Systems  Constitutional: Negative for fever, chills, weight loss, malaise/fatigue and diaphoresis.  HENT: Negative for hearing loss, ear pain, nosebleeds, congestion, sore throat, neck pain, tinnitus and ear discharge.   Eyes: Negative for blurred vision, double vision, photophobia, pain, discharge and redness.  Respiratory: Negative for cough, hemoptysis, sputum production, shortness of breath, wheezing and stridor.   Cardiovascular: Negative for chest pain, palpitations, orthopnea, claudication, leg swelling and PND.  Gastrointestinal: Positive for abdominal pain. Negative for heartburn, nausea, vomiting, diarrhea, constipation, blood in stool and melena.  Genitourinary: Negative for dysuria, urgency, frequency, hematuria and flank pain.  Musculoskeletal: Negative for myalgias, back pain, joint pain and falls.  Skin: Negative for itching and rash.  Neurological: Negative for dizziness, tingling, tremors, sensory change, speech change, focal weakness, seizures, loss of consciousness, weakness and headaches.  Endo/Heme/Allergies: Negative for environmental allergies and polydipsia. Does not bruise/bleed easily.  Psychiatric/Behavioral: Negative for depression, suicidal ideas, hallucinations, memory loss and substance abuse. The patient is not nervous/anxious and does not have insomnia.      PHYSICAL EXAM:  Blood pressure 119/67, pulse 62, temperature 98.4 F (36.9 C), resp. rate 18, height 5\' 4"  (1.626 m), weight 94.3 kg, last menstrual period 08/03/2022, SpO2 100%.    Vitals reviewed. Constitutional: She is oriented to person, place, and time. She appears well-developed and well-nourished.  HENT:  Head: Normocephalic and atraumatic.  Right Ear: External ear normal.  Left Ear: External ear normal.  Nose: Nose normal.  Mouth/Throat: Oropharynx is clear and moist.  Eyes: Conjunctivae and EOM are normal. Pupils are equal,  round, and reactive to light. Right eye exhibits no discharge. Left eye exhibits no discharge. No scleral icterus.  Neck: Normal range of motion. Neck supple. No tracheal deviation present. No thyromegaly present.  Cardiovascular: Normal rate, regular rhythm, normal heart sounds and intact distal pulses.  Exam reveals no gallop and no friction rub.   No murmur heard. Respiratory: Effort normal and breath sounds normal. No respiratory distress. She has no wheezes. She has no rales. She exhibits no tenderness.  GI: Soft. Bowel sounds are normal. She exhibits no distension and no mass. There is tenderness. There is no rebound and no guarding.  Genitourinary:       Vulva is normal without lesions Vagina is pink moist without discharge Cervix normal in appearance and  pap is normal Uterus is normal size, contour, position, consistency, mobility, non-tender Adnexa is negative with normal sized ovaries by sonogram  Musculoskeletal: Normal range of motion. She exhibits no edema and no tenderness.  Neurological: She is alert and oriented to person, place, and time. She has normal reflexes. She displays normal reflexes. No cranial nerve deficit. She exhibits normal muscle tone. Coordination normal.  Skin: Skin is warm and dry. No rash noted. No erythema. No pallor.  Psychiatric: She has a normal mood and affect. Her behavior is normal. Judgment and thought content normal.    Labs: Results for orders placed or performed during the hospital encounter of 01/11/23 (from the past 336 hour(s))  Pregnancy, urine POC   Collection Time: 01/11/23  9:39 AM  Result Value Ref Range   Preg Test, Ur NEGATIVE NEGATIVE  Results for orders placed or performed in visit on 01/03/23 (from the past 336 hour(s))  Surgical pathology( [place]/ [facility])   Collection Time: 01/03/23 10:32 AM  Result Value Ref Range   SURGICAL PATHOLOGY      SURGICAL PATHOLOGY CASE: [id number] PATIENT: Debbie Young Surgical  Pathology Report     Clinical History: menometrorrhagia (cm)     FINAL MICROSCOPIC DIAGNOSIS:  A. ENDOMETRIUM, BIOPSY:      Benign endometrium with inactive glands and stromal pseudodecidualization consistent with exogenous progestin effect.      Negative for hyperplasia, atypia or malignancy.   GROSS DESCRIPTION:  Specimen is received in formalin consists of a 2.3 x 1.8 x 0.5 cm aggregate of red-brown clotted blood and soft tissue.  Specimen is entirely submitted in 2 cassettes.  Lovey Newcomer 01/04/2023)    Final Diagnosis performed by Lance Coon, MD.   Electronically signed 01/05/2023 Technical and / or Professional components performed at [facility]. [facility], [address], [place], [place] [address].  Immunohistochemistry Technical component (if applicable) was performed at [facility]. [address], [place], [place] [address].   IMMUNOHISTOCHEMISTRY  DISCLAIMER (if applicable): Some of these immunohistochemical stains may have been developed and the performance characteristics determine by [facility]. Some may not have been cleared or approved by the U.S. Food and Drug Administration. The FDA has determined that such clearance or approval is not necessary. This test is used for clinical purposes. It should not be regarded as investigational or for research. This laboratory is certified under the Clinical Laboratory Improvement Amendments of 1988 (CLIA-88) as qualified to perform high complexity clinical laboratory testing.  The controls stained appropriately.   IHC stains are performed on formalin fixed, paraffin embedded tissue using a 3,3"diaminobenzidine (DAB) chromogen and Leica Bond Autostainer System. The staining intensity of the nucleus is score manually and is reported as the percentage of tumor cell nuclei demonstrating specific nuclear staining. The specimens are fixed in 10% Neutral Formalin  for at least 6  hours and up to 72hrs. These tests are validated on decalcified tissue. Results should be interpreted with caution given the possibility of false negative results on decalcified specimens. Antibody Clones are as follows ER-clone 34F, PR-clone 16, Ki67- clone MM1. Some of these immunohistochemical stains may have been developed and the performance characteristics determined by [facility].   Results for orders placed or performed during the hospital encounter of 01/02/23 (from the past 336 hour(s))  CBC   Collection Time: 01/02/23  2:21 PM  Result Value Ref Range   WBC 4.9 4.0 - 10.5 K/uL   RBC 4.20 3.87 - 5.11 MIL/uL  Hemoglobin 13.0 12.0 - 15.0 g/dL   HCT 55.7 32.2 - 02.5 %   MCV 93.8 80.0 - 100.0 fL   MCH 31.0 26.0 - 34.0 pg   MCHC 33.0 30.0 - 36.0 g/dL   RDW 42.7 06.2 - 37.6 %   Platelets 215 150 - 400 K/uL   nRBC 0.0 0.0 - 0.2 %  Comprehensive metabolic panel   Collection Time: 01/02/23  2:21 PM  Result Value Ref Range   Sodium 136 135 - 145 mmol/L   Potassium 3.8 3.5 - 5.1 mmol/L   Chloride 107 98 - 111 mmol/L   CO2 18 (L) 22 - 32 mmol/L   Glucose, Bld 91 70 - 99 mg/dL   BUN 19 6 - 20 mg/dL   Creatinine, Ser 2.83 (H) 0.44 - 1.00 mg/dL   Calcium 8.4 (L) 8.9 - 10.3 mg/dL   Total Protein 7.4 6.5 - 8.1 g/dL   Albumin 4.2 3.5 - 5.0 g/dL   AST 20 15 - 41 U/L   ALT 25 0 - 44 U/L   Alkaline Phosphatase 60 38 - 126 U/L   Total Bilirubin 0.8 0.3 - 1.2 mg/dL   GFR, Estimated >15 >17 mL/min   Anion gap 11 5 - 15  Rapid HIV screen (HIV 1/2 Ab+Ag)   Collection Time: 01/02/23  2:21 PM  Result Value Ref Range   HIV-1 P24 Antigen - HIV24 NON REACTIVE NON REACTIVE   HIV 1/2 Antibodies NON REACTIVE NON REACTIVE   Interpretation (HIV Ag Ab)      A non reactive test result means that HIV 1 or HIV 2 antibodies and HIV 1 p24 antigen were not detected in the specimen.  Urinalysis, Routine w reflex microscopic -Urine, Clean Catch   Collection Time: 01/02/23  2:21 PM  Result  Value Ref Range   Color, Urine YELLOW YELLOW   APPearance CLEAR CLEAR   Specific Gravity, Urine 1.019 1.005 - 1.030   pH 5.0 5.0 - 8.0   Glucose, UA NEGATIVE NEGATIVE mg/dL   Hgb urine dipstick MODERATE (A) NEGATIVE   Bilirubin Urine NEGATIVE NEGATIVE   Ketones, ur NEGATIVE NEGATIVE mg/dL   Protein, ur NEGATIVE NEGATIVE mg/dL   Nitrite NEGATIVE NEGATIVE   Leukocytes,Ua NEGATIVE NEGATIVE   RBC / HPF 6-10 0 - 5 RBC/hpf   WBC, UA 0-5 0 - 5 WBC/hpf   Bacteria, UA RARE (A) NONE SEEN   Squamous Epithelial / HPF 0-5 0 - 5 /HPF   Mucus PRESENT   Pregnancy, urine   Collection Time: 01/02/23  2:21 PM  Result Value Ref Range   Preg Test, Ur NEGATIVE NEGATIVE    EKG: No orders found for this or any previous visit.  Imaging Studies: MM 3D SCREENING MAMMOGRAM BILATERAL BREAST  Result Date: 12/22/2022 CLINICAL DATA:  Screening. EXAM: DIGITAL SCREENING BILATERAL MAMMOGRAM WITH TOMOSYNTHESIS AND CAD TECHNIQUE: Bilateral screening digital craniocaudal and mediolateral oblique mammograms were obtained. Bilateral screening digital breast tomosynthesis was performed. The images were evaluated with computer-aided detection. COMPARISON:  Previous exam(s). ACR Breast Density Category c: The breasts are heterogeneously dense, which may obscure small masses. FINDINGS: There are no findings suspicious for malignancy. IMPRESSION: No mammographic evidence of malignancy. A result letter of this screening mammogram will be mailed directly to the patient. RECOMMENDATION: Screening mammogram in one year. (Code:SM-B-01Y) BI-RADS CATEGORY  1: Negative. Electronically Signed   By: Sherian Rein M.D.   On: 12/22/2022 12:58      Assessment: Menometrorrhagia Anemia requiring transfusion Side effects from  megestrol   Plan: RA TLH +BSO  Pt understands the risks of surgery including but not limited t  excessive bleeding requiring transfusion or reoperation, post-operative infection requiring prolonged hospitalization  or re-hospitalization and antibiotic therapy, and damage to other organs including bladder, bowel, ureters and major vessels.  The patient also understands the alternative treatment options which were discussed in full.  All questions were answered.       Lazaro Arms 01/11/2023 10:53 AM

## 2023-01-12 ENCOUNTER — Encounter (HOSPITAL_COMMUNITY): Payer: Self-pay | Admitting: Obstetrics & Gynecology

## 2023-01-13 LAB — SURGICAL PATHOLOGY

## 2023-01-13 NOTE — Anesthesia Postprocedure Evaluation (Signed)
Anesthesia Post Note  Patient: Debbie Young  Procedure(s) Performed: XI ROBOTIC ASSISTED TOTAL HYSTERECTOMY WITH BILATERAL SALPINGO OOPHORECTOMY (Bilateral: Abdomen)  Patient location during evaluation: Phase II Anesthesia Type: General Level of consciousness: awake Pain management: pain level controlled Vital Signs Assessment: post-procedure vital signs reviewed and stable Respiratory status: spontaneous breathing and respiratory function stable Cardiovascular status: blood pressure returned to baseline and stable Postop Assessment: no headache and no apparent nausea or vomiting Anesthetic complications: no Comments: Late entry   No notable events documented.   Last Vitals:  Vitals:   01/11/23 1530 01/11/23 1538  BP: 132/68 118/80  Pulse: 67 73  Resp: 10 10  Temp:  36.4 C  SpO2: 92% 98%    Last Pain:  Vitals:   01/12/23 1314  TempSrc:   PainSc: 0-No pain                 Windell Norfolk

## 2023-01-16 ENCOUNTER — Encounter: Payer: 59 | Admitting: Obstetrics & Gynecology

## 2023-01-19 ENCOUNTER — Ambulatory Visit (INDEPENDENT_AMBULATORY_CARE_PROVIDER_SITE_OTHER): Payer: 59 | Admitting: Obstetrics & Gynecology

## 2023-01-19 ENCOUNTER — Encounter: Payer: Self-pay | Admitting: Obstetrics & Gynecology

## 2023-01-19 VITALS — BP 105/73 | HR 77 | Ht 64.0 in | Wt 209.0 lb

## 2023-01-19 DIAGNOSIS — Z9889 Other specified postprocedural states: Secondary | ICD-10-CM

## 2023-01-23 NOTE — Progress Notes (Signed)
  HPI: Patient returns for routine postoperative follow-up having undergone RA TLH BSO on 01/11/23.  The patient's immediate postoperative recovery has been unremarkable. Since hospital discharge the patient reports no problems.   Current Outpatient Medications: fluticasone (FLONASE) 50 MCG/ACT nasal spray, Place 2 sprays into both nostrils daily., Disp: 16 g, Rfl: 6 ketorolac (TORADOL) 10 MG tablet, Take 1 tablet (10 mg total) by mouth every 8 (eight) hours as needed., Disp: 15 tablet, Rfl: 0 ondansetron (ZOFRAN-ODT) 8 MG disintegrating tablet, Take 1 tablet (8 mg total) by mouth every 8 (eight) hours as needed for nausea or vomiting., Disp: 8 tablet, Rfl: 0 albuterol (VENTOLIN HFA) 108 (90 Base) MCG/ACT inhaler, Inhale 2 puffs into the lungs every 6 (six) hours as needed for wheezing or shortness of breath. (Patient not taking: Reported on 01/19/2023), Disp: 18 g, Rfl: 1 budesonide-formoterol (SYMBICORT) 80-4.5 MCG/ACT inhaler, Inhale 2 puffs into the lungs in the morning and at bedtime. (Patient not taking: Reported on 01/19/2023), Disp: 1 each, Rfl: 1 EPINEPHrine 0.3 mg/0.3 mL IJ SOAJ injection, Inject 0.3 mg into the muscle as needed for anaphylaxis. (Patient not taking: Reported on 01/03/2023), Disp: 1 each, Rfl: 3  No current facility-administered medications for this visit.    Blood pressure 105/73, pulse 77, height 5\' 4"  (1.626 m), weight 209 lb (94.8 kg), last menstrual period 08/03/2022.  Physical Exam: 4 incisions all look good Abdomen is soft and benign  Diagnostic Tests:   Pathology: benign  Impression + Management plan:   ICD-10-CM   1. Post-operative state: RA TLH BSO 01/11/23  Z98.890          Medications Prescribed this encounter: No orders of the defined types were placed in this encounter.     Follow up: No follow-ups on file.    Lazaro Arms, MD Attending Physician for the Center for [facility] 01/23/2023 7:56  PM

## 2023-01-31 ENCOUNTER — Encounter: Payer: Self-pay | Admitting: Obstetrics & Gynecology

## 2023-02-04 ENCOUNTER — Encounter: Payer: Self-pay | Admitting: Obstetrics & Gynecology

## 2023-03-06 ENCOUNTER — Ambulatory Visit: Payer: 59 | Admitting: Obstetrics & Gynecology

## 2023-03-06 ENCOUNTER — Encounter: Payer: Self-pay | Admitting: Obstetrics & Gynecology

## 2023-03-06 VITALS — BP 133/74 | HR 63 | Ht 64.0 in | Wt 212.0 lb

## 2023-03-06 DIAGNOSIS — Z9889 Other specified postprocedural states: Secondary | ICD-10-CM

## 2023-03-06 NOTE — Progress Notes (Signed)
  HPI: Patient returns for routine postoperative follow-up having undergone RA TLH BS on 01/11/23.  The patient's immediate postoperative recovery has been unremarkable. Since hospital discharge the patient reports no problems, scabbing of 2 right incisions.   Current Outpatient Medications: albuterol (VENTOLIN HFA) 108 (90 Base) MCG/ACT inhaler, Inhale 2 puffs into the lungs every 6 (six) hours as needed for wheezing or shortness of breath. (Patient not taking: Reported on 01/19/2023), Disp: 18 g, Rfl: 1 budesonide-formoterol (SYMBICORT) 80-4.5 MCG/ACT inhaler, Inhale 2 puffs into the lungs in the morning and at bedtime. (Patient not taking: Reported on 01/19/2023), Disp: 1 each, Rfl: 1 EPINEPHrine 0.3 mg/0.3 mL IJ SOAJ injection, Inject 0.3 mg into the muscle as needed for anaphylaxis. (Patient not taking: Reported on 01/03/2023), Disp: 1 each, Rfl: 3 fluticasone (FLONASE) 50 MCG/ACT nasal spray, Place 2 sprays into both nostrils daily. (Patient not taking: Reported on 03/06/2023), Disp: 16 g, Rfl: 6  No current facility-administered medications for this visit.    Blood pressure 133/74, pulse 63, height 5\' 4"  (1.626 m), weight 212 lb (96.2 kg), last menstrual period 08/03/2022.  Physical Exam: 4 incisions all healed well, some scbbing og he 2 right ones, no erythema, recommend neosporin to soften and for strep Vaginal cuff is healing well, as expected suture is palpated including barbs but the cuff itself is healing well  Diagnostic Tests:   Pathology: Benign  Impression + Management plan:    ICD-10-CM   1. Post-operative state: RA TLH BSO 01/11/23  Z98.890    Recommend delaying intercourse for another 4 weeks, follow-up as needed or 3 years        Medications Prescribed this encounter: No orders of the defined types were placed in this encounter.     Follow up: Return in about 3 years (around 03/05/2026), or if symptoms worsen or fail to improve.    Lazaro Arms,  MD Attending Physician for the Center for [facility] 03/06/2023 9:10 AM

## 2023-06-07 ENCOUNTER — Encounter: Payer: Self-pay | Admitting: Obstetrics & Gynecology

## 2023-06-08 MED ORDER — FLUCONAZOLE 150 MG PO TABS: ORAL_TABLET | ORAL | 1 refills | Status: AC

## 2023-06-19 ENCOUNTER — Ambulatory Visit
Admission: RE | Admit: 2023-06-19 | Discharge: 2023-06-19 | Disposition: A | Discharge status: Home / Self Care | Payer: 59 | Source: Ambulatory Visit | Attending: Nurse Practitioner | Admitting: Nurse Practitioner

## 2023-06-19 VITALS — BP 114/79 | HR 75 | Temp 97.9°F | Resp 20

## 2023-06-19 DIAGNOSIS — J4521 Mild intermittent asthma with (acute) exacerbation: Secondary | ICD-10-CM | POA: Diagnosis not present

## 2023-06-19 DIAGNOSIS — H66003 Acute suppurative otitis media without spontaneous rupture of ear drum, bilateral: Secondary | ICD-10-CM | POA: Diagnosis not present

## 2023-06-19 MED ORDER — AMOXICILLIN-POT CLAVULANATE 875-125 MG PO TABS: 1.0000 | ORAL_TABLET | Freq: Two times a day (BID) | ORAL | 0 refills | Status: AC

## 2023-06-19 MED ORDER — ALBUTEROL SULFATE HFA 108 (90 BASE) MCG/ACT IN AERS: 2.0000 | INHALATION_SPRAY | Freq: Four times a day (QID) | RESPIRATORY_TRACT | 0 refills | Status: DC | PRN

## 2023-06-19 NOTE — ED Provider Notes (Signed)
[facility]    CSN: [id number] Arrival date & time: 06/19/23  1045      History   Chief Complaint Chief Complaint  Patient presents with   Cough    Runny nose and wheezing with ear pain - Entered by patient    HPI Debbie Young is a 53 y.o. female.   Patient presents today for 3-day history of runny and stuffy nose, ear pain in her right ear that is now improved after using sweet oil, headache that is now improved, and fatigue.  Reports 8 days ago, she began developing flu symptoms after her daughter tested positive for the flu and was treated for the flu.  She reports an occasional dry cough and shortness of breath with activity associated from when those symptoms began.  She denies fever, body aches or chills, sore throat, sinus pressure, abdominal pain, nausea/vomiting, and diarrhea.  Has used rescue inhaler and Tamiflu for symptoms which helped temporarily.  Medical history significant for asthma.  She has had 1 flareup of asthma as an adult and it was last year when she was sick.  Patient currently vapes.    Past Medical History:  Diagnosis Date   Anxiety    Asthma    history of asthma as child   Cellulitis 04/2001   LEFT FOOT   Diverticulitis    Hematuria    BENIGN--SAW UROLOGIST   History of abnormal cervical Pap smear 04/30/2015   LGSIL (low grade squamous intraepithelial dysplasia) 1997   Miscarriage 08/02/2013   Patient desires pregnancy 11/18/2013   Seizure disorder (HCC)    AS A CHILD--NO MEDS SINCE 16 YO   Smoker    Tobacco use    Vaginal Pap smear, abnormal     Patient Active Problem List   Diagnosis Date Noted   Pelvic pain 08/04/2022   Menometrorrhagia 07/25/2022   Anemia 07/25/2022   Tired 07/25/2022   Encounter for screening fecal occult blood testing 10/27/2021   Encounter for well woman exam with routine gynecological exam 10/27/2021   Peri-menopausal 10/15/2020   Encounter for gynecological examination with Papanicolaou smear of  cervix 10/15/2020   Tobacco use    S/P left knee arthroscopy 04/26/18 05/03/2018   Family history of breast cancer 08/04/2017   Diverticulitis of large intestine with perforation without bleeding    Diverticulitis 07/28/2017   Status post tubal ligation 03/09/2017   History of cesarean delivery 08/24/2016   History of preterm delivery 08/24/2016   History of abnormal cervical Pap smear 04/30/2015   Miscarriage 08/02/2013   LGSIL (low grade squamous intraepithelial dysplasia)    Cellulitis    Seizure disorder (HCC)    Hematuria    Smoker     Past Surgical History:  Procedure Laterality Date   BUNIONECTOMY Bilateral 1988, 1990   CERVICAL BIOPSY  W/ LOOP ELECTRODE EXCISION  07/22/2002   CESAREAN SECTION     CESAREAN SECTION WITH BILATERAL TUBAL LIGATION Bilateral 03/09/2017   Procedure: REPEAT CESAREAN SECTION WITH BILATERAL TUBAL LIGATION;  Surgeon: Tilda Burrow, MD;  Location: [facility];  Service: Obstetrics;  Laterality: Bilateral;   CHOLECYSTECTOMY     COLPOSCOPY     KNEE ARTHROSCOPY Right 04/26/2018   Procedure: ARTHROSCOPY KNEE with removal of ganglion of Anterior Cruciate Ligament;  Surgeon: Vickki Hearing, MD;  Location: [facility];  Service: Orthopedics;  Laterality: Right;   REFRACTIVE SURGERY  05/23/1998   ROBOTIC ASSISTED TOTAL HYSTERECTOMY WITH BILATERAL SALPINGO OOPHERECTOMY Bilateral 01/11/2023   Procedure:  XI ROBOTIC ASSISTED TOTAL HYSTERECTOMY WITH BILATERAL SALPINGO OOPHORECTOMY;  Surgeon: Lazaro Arms, MD;  Location: [facility];  Service: Gynecology;  Laterality: Bilateral;   TONSILLECTOMY AND ADENOIDECTOMY      OB History     Gravida  4   Para  2   Term  1   Preterm  1   AB  2   Living  2      SAB  2   IAB      Ectopic      Multiple  0   Live Births  2            Home Medications    Prior to Admission medications   Medication Sig Start Date End Date Taking? Authorizing Provider  amoxicillin-clavulanate (AUGMENTIN)  875-125 MG tablet Take 1 tablet by mouth 2 (two) times daily for 7 days. 06/19/23 06/26/23 Yes Valentino Nose, NP  albuterol (VENTOLIN HFA) 108 (90 Base) MCG/ACT inhaler Inhale 2 puffs into the lungs every 6 (six) hours as needed for wheezing or shortness of breath. 06/19/23   Valentino Nose, NP  budesonide-formoterol (SYMBICORT) 80-4.5 MCG/ACT inhaler Inhale 2 puffs into the lungs in the morning and at bedtime. Patient not taking: Reported on 01/19/2023 09/11/22   Raspet, Noberto Retort, PA-C  EPINEPHrine 0.3 mg/0.3 mL IJ SOAJ injection Inject 0.3 mg into the muscle as needed for anaphylaxis. Patient not taking: Reported on 01/03/2023 12/16/20   Donita Brooks, MD  fluconazole (DIFLUCAN) 150 MG tablet Take 1 tablet today and repeat in 3 days 06/08/23   Lazaro Arms, MD  fluticasone [facility]) 50 MCG/ACT nasal spray Place 2 sprays into both nostrils daily. Patient not taking: Reported on 03/06/2023 12/09/21   Donita Brooks, MD    Family History Family History  Problem Relation Age of Onset   Breast cancer Mother 63   Fibromyalgia Mother    Von Willebrand disease Mother    Stroke Mother    Hyperlipidemia Mother    Hypertension Mother    Breast cancer Maternal Aunt    Breast cancer Maternal Grandmother    Heart disease Maternal Grandmother    Anuerysm Maternal Grandmother    Heart failure Maternal Grandmother    Hypertension Maternal Grandfather    Stroke Maternal Grandfather    Heart disease Paternal Grandmother    Heart disease Paternal Grandfather    Cancer Father        Myloma and Mylodysplasia - [place]    Asthma Son     Social History Social History   Tobacco Use   Smoking status: Every Day    Types: E-cigarettes   Smokeless tobacco: Never  Vaping Use   Vaping status: Every Day   Substances: Nicotine, Flavoring  Substance Use Topics   Alcohol use: Yes    Comment: social drink   Drug use: No     Allergies   Prednisone   Review of Systems Review of  Systems Per HPI  Physical Exam Triage Vital Signs ED Triage Vitals  Encounter Vitals Group     BP 06/19/23 1104 114/79     Systolic BP Percentile --      Diastolic BP Percentile --      Pulse Rate 06/19/23 1104 75     Resp 06/19/23 1104 20     Temp 06/19/23 1104 97.9 F (36.6 C)     Temp Source 06/19/23 1104 Oral     SpO2 06/19/23 1104 99 %  Weight --      Height --      Head Circumference --      Peak Flow --      Pain Score 06/19/23 1105 0     Pain Loc --      Pain Education --      Exclude from Growth Chart --    No data found.  Updated Vital Signs BP 114/79 (BP Location: Right Arm)   Pulse 75   Temp 97.9 F (36.6 C) (Oral)   Resp 20   LMP 08/03/2022 (Approximate) Comment: negative urine preg.01/02/23  SpO2 99%   Visual Acuity Right Eye Distance:   Left Eye Distance:   Bilateral Distance:    Right Eye Near:   Left Eye Near:    Bilateral Near:     Physical Exam Vitals and nursing note reviewed.  Constitutional:      General: She is not in acute distress.    Appearance: Normal appearance. She is not ill-appearing or toxic-appearing.  HENT:     Head: Normocephalic and atraumatic.     Right Ear: Ear canal and external ear normal. Tympanic membrane is erythematous.     Left Ear: Ear canal and external ear normal. Tympanic membrane is erythematous.     Nose: No congestion or rhinorrhea.     Mouth/Throat:     Mouth: Mucous membranes are moist.     Pharynx: Oropharynx is clear. No oropharyngeal exudate or posterior oropharyngeal erythema.  Eyes:     General: No scleral icterus.    Extraocular Movements: Extraocular movements intact.  Cardiovascular:     Rate and Rhythm: Normal rate and regular rhythm.  Pulmonary:     Effort: Pulmonary effort is normal. No respiratory distress.     Breath sounds: Wheezing present. No rhonchi or rales.  Musculoskeletal:     Cervical back: Normal range of motion and neck supple.  Lymphadenopathy:     Cervical: No  cervical adenopathy.  Skin:    General: Skin is warm and dry.     Coloration: Skin is not jaundiced or pale.     Findings: No erythema or rash.  Neurological:     Mental Status: She is alert and oriented to person, place, and time.  Psychiatric:        Behavior: Behavior is cooperative.      UC Treatments / Results  Labs (all labs ordered are listed, but only abnormal results are displayed) Labs Reviewed - No data to display  EKG   Radiology No results found.  Procedures Procedures (including critical care time)  Medications Ordered in UC Medications - No data to display  Initial Impression / Assessment and Plan / UC Course  I have reviewed the triage vital signs and the nursing notes.  Pertinent labs & imaging results that were available during my care of the patient were reviewed by me and considered in my medical decision making (see chart for details).   Patient is well-appearing, normotensive, afebrile, not tachycardic, not tachypneic, oxygenating well on room air.    1. Non-recurrent acute suppurative otitis media of both ears without spontaneous rupture of tympanic membranes Treat with Augmentin twice daily for 7 days Other supportive care discussed ER and return precautions discussed  2. Mild intermittent asthma with acute exacerbation Vitals and examination are reassuring today Patient is unable to tolerate Prednisone due to history of paranoia while taking the medicine, will defer steroids at this time Start scheduled albuterol inhaler, antitussives, and expectorant Return  for persistent/worsening symptoms despite treatment  The patient was given the opportunity to ask questions.  All questions answered to their satisfaction.  The patient is in agreement to this plan.    Final Clinical Impressions(s) / UC Diagnoses   Final diagnoses:  Non-recurrent acute suppurative otitis media of both ears without spontaneous rupture of tympanic membranes  Mild  intermittent asthma with acute exacerbation     Discharge Instructions      You have an ear infection in both ears.  Take the Augmentin as prescribed to treat it.    For your breathing, start using the albuterol inhaler every 4-6 hours scheduled for the next 48 hours, then use as needed.  Also recommend starting guaifenesin 600 mg twice daily to help break up the congestion.  Seek care if symptoms persist or worsen despite treatment.     ED Prescriptions     Medication Sig Dispense Auth. Provider   amoxicillin-clavulanate (AUGMENTIN) 875-125 MG tablet Take 1 tablet by mouth 2 (two) times daily for 7 days. 14 tablet Cathlean Marseilles A, NP   albuterol (VENTOLIN HFA) 108 (90 Base) MCG/ACT inhaler Inhale 2 puffs into the lungs every 6 (six) hours as needed for wheezing or shortness of breath. 18 g Valentino Nose, NP      PDMP not reviewed this encounter.   Valentino Nose, NP 06/19/23 1311

## 2023-06-19 NOTE — Discharge Instructions (Addendum)
You have an ear infection in both ears.  Take the Augmentin as prescribed to treat it.    For your breathing, start using the albuterol inhaler every 4-6 hours scheduled for the next 48 hours, then use as needed.  Also recommend starting guaifenesin 600 mg twice daily to help break up the congestion.  Seek care if symptoms persist or worsen despite treatment.

## 2023-06-19 NOTE — ED Triage Notes (Signed)
Pt reports cough, nasal congestion, ear pain, wheezing, difficulty breathing. Pt states she does not feel like she is moving air as much as she should x 3 days.

## 2023-11-20 ENCOUNTER — Telehealth: Admitting: Physician Assistant

## 2023-11-20 DIAGNOSIS — L237 Allergic contact dermatitis due to plants, except food: Secondary | ICD-10-CM

## 2023-11-20 MED ORDER — TRIAMCINOLONE ACETONIDE 0.1 % EX CREA
1.0000 | TOPICAL_CREAM | Freq: Two times a day (BID) | CUTANEOUS | 0 refills | Status: AC
Start: 2023-11-20 — End: ?

## 2023-11-20 NOTE — Progress Notes (Signed)
 E Visit for Rash  We are sorry that you are not feeling well. Here is how we plan to help!  Based on what you shared with me it looks like you have contact dermatitis.  Contact dermatitis is a skin rash caused by something that touches the skin and causes irritation or inflammation.  Your skin may be red, swollen, dry, cracked, and itch.  The rash should go away in a few days but can last a few weeks.  If you get a rash, it's important to figure out what caused it so the irritant can be avoided in the future. and I am prescribing triamcinolone 0.1 % cream -- apply to the affected area(s) in a thin layer, twice daily for up to 14 days. Do not apply to face, privates or armpit regions.    HOME CARE:  Take cool showers and avoid direct sunlight. Apply cool compress or wet dressings. Take a bath in an oatmeal bath.  Sprinkle content of one Aveeno packet under running faucet with comfortably warm water.  Bathe for 15-20 minutes, 1-2 times daily.  Pat dry with a towel. Do not rub the rash. Use hydrocortisone cream. Take an antihistamine like Benadryl  for widespread rashes that itch.  The adult dose of Benadryl  is 25-50 mg by mouth 4 times daily. Caution:  This type of medication may cause sleepiness.  Do not drink alcohol, drive, or operate dangerous machinery while taking antihistamines.  Do not take these medications if you have prostate enlargement.  Read package instructions thoroughly on all medications that you take.  GET HELP RIGHT AWAY IF:  Symptoms don't go away after treatment. Severe itching that persists. If you rash spreads or swells. If you rash begins to smell. If it blisters and opens or develops a yellow-brown crust. You develop a fever. You have a sore throat. You become short of breath.  MAKE SURE YOU:  Understand these instructions. Will watch your condition. Will get help right away if you are not doing well or get worse.  Thank you for choosing an e-visit.  Your  e-visit answers were reviewed by a board certified advanced clinical practitioner to complete your personal care plan. Depending upon the condition, your plan could have included both over the counter or prescription medications.  Please review your pharmacy choice. Make sure the pharmacy is open so you can pick up prescription now. If there is a problem, you may contact your provider through [employer] and have the prescription routed to another pharmacy.  Your safety is important to us . If you have drug allergies check your prescription carefully.   For the next 24 hours you can use MyChart to ask questions about today's visit, request a non-urgent call back, or ask for a work or school excuse. You will get an email in the next two days asking about your experience. I hope that your e-visit has been valuable and will speed your recovery.   I have spent 5 minutes in review of e-visit questionnaire, review and updating patient chart, medical decision making and response to patient.   Angelia Kelp, PA-C

## 2023-12-06 ENCOUNTER — Other Ambulatory Visit (HOSPITAL_COMMUNITY): Payer: Self-pay | Admitting: Family Medicine

## 2023-12-06 DIAGNOSIS — Z1231 Encounter for screening mammogram for malignant neoplasm of breast: Secondary | ICD-10-CM

## 2023-12-15 ENCOUNTER — Other Ambulatory Visit: Payer: Self-pay | Admitting: Medical Genetics

## 2023-12-22 ENCOUNTER — Ambulatory Visit (HOSPITAL_COMMUNITY)
Admission: RE | Admit: 2023-12-22 | Discharge: 2023-12-22 | Disposition: A | Discharge status: Home / Self Care | Source: Ambulatory Visit

## 2023-12-22 DIAGNOSIS — Z1231 Encounter for screening mammogram for malignant neoplasm of breast: Secondary | ICD-10-CM | POA: Insufficient documentation

## 2023-12-27 ENCOUNTER — Other Ambulatory Visit (HOSPITAL_COMMUNITY)
Admission: RE | Admit: 2023-12-27 | Discharge: 2023-12-27 | Disposition: A | Discharge status: Home / Self Care | Payer: Self-pay | Source: Ambulatory Visit | Attending: Medical Genetics | Admitting: Medical Genetics

## 2024-01-07 LAB — GENECONNECT MOLECULAR SCREEN: Genetic Analysis Overall Interpretation: NEGATIVE

## 2024-01-19 ENCOUNTER — Encounter: Payer: Self-pay | Admitting: Family Medicine

## 2024-01-19 ENCOUNTER — Ambulatory Visit: Admitting: Family Medicine

## 2024-01-19 VITALS — BP 116/72 | HR 92 | Ht 64.0 in | Wt 190.1 lb

## 2024-01-19 DIAGNOSIS — M2550 Pain in unspecified joint: Secondary | ICD-10-CM | POA: Diagnosis not present

## 2024-01-19 MED ORDER — AMOXICILLIN-POT CLAVULANATE 875-125 MG PO TABS: 1.0000 | ORAL_TABLET | Freq: Two times a day (BID) | ORAL | 0 refills | Status: AC

## 2024-01-19 NOTE — Progress Notes (Signed)
 Subjective:    Patient ID: Debbie Young, female    DOB: 12-30-1970, 53 y.o.   MRN: [id number]  Abdominal Pain   Patient states that she hurts all over her body.  She questions if she has fibromyalgia.  2 family members including her mother had fibromyalgia.  The patient specifically complains of pain in her neck and her elbows and her wrist and in her hands.  Specifically the pain is worse at the [facility] joint of her thumb.  She also complains of pain in her knees and in her hips and her lower back.  The only area that is spared is her ankles.  She has had an x-ray of her hand that showed normal arthritis in the past.  She has not had x-rays of her knees or shoulders neck recently.  She denies any fevers or chills.  There is no evidence of any erythema or joint effusions in her hand.  However she has dramatically limited range of motion.  She complains of stiffness and pain with minimal activity.  She also believes that she is having a flare of diverticulitis.  She has known diverticulosis.  She states that she is having right-sided abdominal discomfort.  On exam today, her abdomen is soft, nondistended, and nontender to palpation.  There is no guarding.  There is no rebound. Past Medical History:  Diagnosis Date   Anxiety    Asthma    history of asthma as child   Cellulitis 04/2001   LEFT FOOT   Diverticulitis    Hematuria    BENIGN--SAW UROLOGIST   History of abnormal cervical Pap smear 04/30/2015   LGSIL (low grade squamous intraepithelial dysplasia) 1997   Miscarriage 08/02/2013   Patient desires pregnancy 11/18/2013   Seizure disorder (HCC)    AS A CHILD--NO MEDS SINCE 16 YO   Smoker    Tobacco use    Vaginal Pap smear, abnormal    Past Surgical History:  Procedure Laterality Date   BUNIONECTOMY Bilateral 1988, 1990   CERVICAL BIOPSY  W/ LOOP ELECTRODE EXCISION  07/22/2002   CESAREAN SECTION     CESAREAN SECTION WITH BILATERAL TUBAL LIGATION Bilateral 03/09/2017   Procedure: REPEAT  CESAREAN SECTION WITH BILATERAL TUBAL LIGATION;  Surgeon: Edsel Norleen GAILS, MD;  [facility]: [facility];  Service: Obstetrics;  Laterality: Bilateral;   CHOLECYSTECTOMY     COLPOSCOPY     KNEE ARTHROSCOPY Right 04/26/2018   Procedure: ARTHROSCOPY KNEE with removal of ganglion of Anterior Cruciate Ligament;  Surgeon: Margrette Taft BRAVO, MD;  Location: [facility];  Service: Orthopedics;  Laterality: Right;   REFRACTIVE SURGERY  05/23/1998   ROBOTIC ASSISTED TOTAL HYSTERECTOMY WITH BILATERAL SALPINGO OOPHERECTOMY Bilateral 01/11/2023   Procedure: XI ROBOTIC ASSISTED TOTAL HYSTERECTOMY WITH BILATERAL SALPINGO OOPHORECTOMY;  Surgeon: Jayne Vonn DEL, MD;  Location: [facility];  Service: Gynecology;  Laterality: Bilateral;   TONSILLECTOMY AND ADENOIDECTOMY     Current Outpatient Medications on File Prior to Visit  Medication Sig Dispense Refill   albuterol  (VENTOLIN  HFA) 108 (90 Base) MCG/ACT inhaler Inhale 2 puffs into the lungs every 6 (six) hours as needed for wheezing or shortness of breath. 18 g 0   EPINEPHrine  0.3 mg/0.3 mL IJ SOAJ injection Inject 0.3 mg into the muscle as needed for anaphylaxis. 1 each 3   triamcinolone  cream (KENALOG ) 0.1 % Apply 1 Application topically 2 (two) times daily. 30 g 0   budesonide -formoterol  (SYMBICORT ) 80-4.5 MCG/ACT inhaler Inhale 2 puffs into the lungs in the  morning and at bedtime. (Patient not taking: Reported on 01/19/2023) 1 each 1   fluconazole  (DIFLUCAN ) 150 MG tablet Take 1 tablet today and repeat in 3 days (Patient not taking: Reported on 01/19/2024) 2 tablet 1   fluticasone  (FLONASE ) 50 MCG/ACT nasal spray Place 2 sprays into both nostrils daily. (Patient not taking: Reported on 03/06/2023) 16 g 6   No current facility-administered medications on file prior to visit.   Allergies  Allergen Reactions   Prednisone Other (See Comments)    Paranoia   Social History   Socioeconomic History   Marital status: Married    Spouse name: Not on file   Number  of children: 2   Years of education: Not on file   Highest education level: Not on file  Occupational History   Not on file  Tobacco Use   Smoking status: Every Day    Types: E-cigarettes   Smokeless tobacco: Never  Vaping Use   Vaping status: Every Day   Substances: Nicotine, Flavoring  Substance and Sexual Activity   Alcohol use: Yes    Comment: social drink   Drug use: No   Sexual activity: Yes    Birth control/protection: Surgical    Comment: tubal  Other Topics Concern   Not on file  Social History Narrative   Not on file   Social Drivers of Health   Financial Resource Strain: Low Risk  (10/27/2021)   Overall Financial Resource Strain (CARDIA)    Difficulty of Paying Living Expenses: Not hard at all  Food Insecurity: No Food Insecurity (10/27/2021)   Hunger Vital Sign    Worried About Running Out of Food in the Last Year: Never true    Ran Out of Food in the Last Year: Never true  Transportation Needs: No Transportation Needs (10/27/2021)   PRAPARE - Administrator, Civil Service (Medical): No    Lack of Transportation (Non-Medical): No  Physical Activity: Insufficiently Active (10/27/2021)   Exercise Vital Sign    Days of Exercise per Week: 2 days    Minutes of Exercise per Session: 20 min  Stress: No Stress Concern Present (10/27/2021)   [employer] of Occupational Health - Occupational Stress Questionnaire    Feeling of Stress : Only a little  Social Connections: Moderately Integrated (10/27/2021)   Social Connection and Isolation Panel    Frequency of Communication with Friends and Family: More than three times a week    Frequency of Social Gatherings with Friends and Family: Once a week    Attends Religious Services: More than 4 times per year    Active Member of [employer] or Organizations: No    Attends Banker Meetings: Never    Marital Status: Married  Catering manager Violence: Not At Risk (10/27/2021)   Humiliation, Afraid, Rape, and  Kick questionnaire    Fear of Current or Ex-Partner: No    Emotionally Abused: No    Physically Abused: No    Sexually Abused: No      Review of Systems  Gastrointestinal:  Positive for abdominal pain.  All other systems reviewed and are negative.      Objective:   Physical Exam Vitals reviewed.  Constitutional:      Appearance: Normal appearance. She is normal weight.  Cardiovascular:     Rate and Rhythm: Normal rate and regular rhythm.     Heart sounds: Normal heart sounds.  Pulmonary:     Effort: Pulmonary effort is normal.  Breath sounds: Normal breath sounds.  Abdominal:     General: Abdomen is flat. Bowel sounds are normal. There is no distension.     Palpations: Abdomen is soft. There is no splenomegaly, mass or pulsatile mass.     Tenderness: There is no abdominal tenderness. There is no guarding or rebound.  Neurological:     Mental Status: She is alert.           Assessment & Plan:  Polyarthralgia - Plan: CK, Sedimentation rate, ANA, Rheumatoid factor, CBC with Differential/Platelet, Basic Metabolic Panel Without GFR First regarding the polyarthralgias, I suspect that fibromyalgia is a possibility.  I feel that we need to rule out autoimmune diseases first.  I will check a sedimentation rate, a CK level to rule out myositis, and ANA, rheumatoid factor, and a CBC.  If labs are unremarkable, I will try the patient on meloxicam for joint stiffness related to aging.  If this does not improve, I will try Lyrica for possible fibromyalgia.  Based on her abdominal exam I did not feel that she has an acute abdomen or evidence of diverticulitis.  She could be having IBS.  I have recommended trying MiraLAX  and drinking more water .  If the abdominal discomfort worsens, I did give her a prescription for Augmentin  due to the long holiday weekend but I cautioned her not to take it unless the symptoms worsen

## 2024-01-21 LAB — BASIC METABOLIC PANEL WITHOUT GFR
BUN: 14 mg/dL (ref 7–25)
CO2: 29 mmol/L (ref 20–32)
Calcium: 8.9 mg/dL (ref 8.6–10.4)
Chloride: 103 mmol/L (ref 98–110)
Creat: 0.83 mg/dL (ref 0.50–1.03)
Glucose, Bld: 78 mg/dL (ref 65–99)
Potassium: 4 mmol/L (ref 3.5–5.3)
Sodium: 140 mmol/L (ref 135–146)

## 2024-01-21 LAB — CBC WITH DIFFERENTIAL/PLATELET
Absolute Lymphocytes: 1645 {cells}/uL (ref 850–3900)
Absolute Monocytes: 320 {cells}/uL (ref 200–950)
Basophils Absolute: 29 {cells}/uL (ref 0–200)
Basophils Relative: 0.8 %
Eosinophils Absolute: 101 {cells}/uL (ref 15–500)
Eosinophils Relative: 2.8 %
HCT: 41.2 % (ref 35.0–45.0)
Hemoglobin: 13.4 g/dL (ref 11.7–15.5)
MCH: 31.8 pg (ref 27.0–33.0)
MCHC: 32.5 g/dL (ref 32.0–36.0)
MCV: 97.9 fL (ref 80.0–100.0)
MPV: 11.2 fL (ref 7.5–12.5)
Monocytes Relative: 8.9 %
Neutro Abs: 1505 {cells}/uL (ref 1500–7800)
Neutrophils Relative %: 41.8 %
Platelets: 198 Thousand/uL (ref 140–400)
RBC: 4.21 Million/uL (ref 3.80–5.10)
RDW: 12.2 % (ref 11.0–15.0)
Total Lymphocyte: 45.7 %
WBC: 3.6 Thousand/uL — ABNORMAL LOW (ref 3.8–10.8)

## 2024-01-21 LAB — ANA: Anti Nuclear Antibody (ANA): NEGATIVE

## 2024-01-21 LAB — RHEUMATOID FACTOR: Rheumatoid fact SerPl-aCnc: <10 [IU]/mL (ref <14)

## 2024-01-21 LAB — CK: Total CK: 77 U/L (ref 21–240)

## 2024-01-21 LAB — SEDIMENTATION RATE: Sed Rate: 14 mm/h (ref 0–30)

## 2024-01-23 ENCOUNTER — Ambulatory Visit: Payer: Self-pay | Admitting: Family Medicine

## 2024-01-24 ENCOUNTER — Other Ambulatory Visit: Payer: Self-pay

## 2024-01-24 MED ORDER — MELOXICAM 15 MG PO TABS: 15.0000 mg | ORAL_TABLET | Freq: Every day | ORAL | 0 refills | Status: DC

## 2024-02-22 ENCOUNTER — Other Ambulatory Visit: Payer: Self-pay | Admitting: Family Medicine

## 2024-03-15 ENCOUNTER — Ambulatory Visit (INDEPENDENT_AMBULATORY_CARE_PROVIDER_SITE_OTHER)

## 2024-03-15 ENCOUNTER — Ambulatory Visit
Admission: RE | Admit: 2024-03-15 | Discharge: 2024-03-15 | Disposition: A | Discharge status: Home / Self Care | Source: Ambulatory Visit | Attending: Nurse Practitioner | Admitting: Nurse Practitioner

## 2024-03-15 VITALS — BP 107/68 | HR 75 | Temp 98.2°F | Resp 20

## 2024-03-15 DIAGNOSIS — S60211A Contusion of right wrist, initial encounter: Secondary | ICD-10-CM | POA: Diagnosis not present

## 2024-03-15 DIAGNOSIS — W19XXXA Unspecified fall, initial encounter: Secondary | ICD-10-CM

## 2024-03-15 NOTE — ED Provider Notes (Signed)
 [facility]    CSN: [id number] Arrival date & time: 03/15/24  1215      History   Chief Complaint Chief Complaint  Patient presents with   Wrist Pain    Tender outside wrist bone after fall 03/13/2024 - Entered by patient    HPI Debbie Young is a 53 y.o. female.   The history is provided by the patient.   Patient presents for complaints of right wrist pain.  Patient states symptoms started approximately 2 days ago after she fell onto the right wrist.  She states since that time, she has had pain, bruising, and swelling.  Patient states that when she moves the wrist a certain way, she can feel a pain that shoots into the right forearm.  States that pain worsens when she is trying to pick up items using her right hand.  She also states that at night, the pain feels like a dull ache.  She states that she did apply ice to the area after her symptoms started.  She denies numbness, or tingling.  She denies any prior injury to the right wrist. Past Medical History:  Diagnosis Date   Anxiety    Asthma    history of asthma as child   Cellulitis 04/2001   LEFT FOOT   Diverticulitis    Hematuria    BENIGN--SAW UROLOGIST   History of abnormal cervical Pap smear 04/30/2015   LGSIL (low grade squamous intraepithelial dysplasia) 1997   Miscarriage 08/02/2013   Patient desires pregnancy 11/18/2013   Seizure disorder (HCC)    AS A CHILD--NO MEDS SINCE 16 YO   Smoker    Tobacco use    Vaginal Pap smear, abnormal     Patient Active Problem List   Diagnosis Date Noted   Pelvic pain 08/04/2022   Menometrorrhagia 07/25/2022   Anemia 07/25/2022   Tired 07/25/2022   Encounter for screening fecal occult blood testing 10/27/2021   Encounter for well woman exam with routine gynecological exam 10/27/2021   Peri-menopausal 10/15/2020   Encounter for gynecological examination with Papanicolaou smear of cervix 10/15/2020   Tobacco use    S/P left knee arthroscopy 04/26/18  05/03/2018   Family history of breast cancer 08/04/2017   Diverticulitis of large intestine with perforation without bleeding    Diverticulitis 07/28/2017   Status post tubal ligation 03/09/2017   History of cesarean delivery 08/24/2016   History of preterm delivery 08/24/2016   History of abnormal cervical Pap smear 04/30/2015   Miscarriage 08/02/2013   LGSIL (low grade squamous intraepithelial dysplasia)    Cellulitis    Seizure disorder (HCC)    Hematuria    Smoker     Past Surgical History:  Procedure Laterality Date   BUNIONECTOMY Bilateral 1988, 1990   CERVICAL BIOPSY  W/ LOOP ELECTRODE EXCISION  07/22/2002   CESAREAN SECTION     CESAREAN SECTION WITH BILATERAL TUBAL LIGATION Bilateral 03/09/2017   Procedure: REPEAT CESAREAN SECTION WITH BILATERAL TUBAL LIGATION;  Surgeon: Edsel Norleen GAILS, MD;  Location: [facility];  Service: Obstetrics;  Laterality: Bilateral;   CHOLECYSTECTOMY     COLPOSCOPY     KNEE ARTHROSCOPY Right 04/26/2018   Procedure: ARTHROSCOPY KNEE with removal of ganglion of Anterior Cruciate Ligament;  Surgeon: Margrette Taft BRAVO, MD;  Location: [facility];  Service: Orthopedics;  Laterality: Right;   REFRACTIVE SURGERY  05/23/1998   ROBOTIC ASSISTED TOTAL HYSTERECTOMY WITH BILATERAL SALPINGO OOPHERECTOMY Bilateral 01/11/2023   Procedure: XI ROBOTIC ASSISTED TOTAL HYSTERECTOMY  WITH BILATERAL SALPINGO OOPHORECTOMY;  Surgeon: Jayne Vonn DEL, MD;  Location: [facility];  Service: Gynecology;  Laterality: Bilateral;   TONSILLECTOMY AND ADENOIDECTOMY      OB History     Gravida  4   Para  2   Term  1   Preterm  1   AB  2   Living  2      SAB  2   IAB      Ectopic      Multiple  0   Live Births  2            Home Medications    Prior to Admission medications   Medication Sig Start Date End Date Taking? Authorizing Provider  albuterol  (VENTOLIN  HFA) 108 (90 Base) MCG/ACT inhaler Inhale 2 puffs into the lungs every 6 (six) hours as  needed for wheezing or shortness of breath. 06/19/23   Chandra Harlene LABOR, NP  amoxicillin -clavulanate (AUGMENTIN ) 875-125 MG tablet Take 1 tablet by mouth 2 (two) times daily. 01/19/24   Duanne Butler DASEN, MD  budesonide -formoterol  (SYMBICORT ) 80-4.5 MCG/ACT inhaler Inhale 2 puffs into the lungs in the morning and at bedtime. Patient not taking: Reported on 01/19/2023 09/11/22   Raspet, Erin K, PA-C  EPINEPHrine  0.3 mg/0.3 mL IJ SOAJ injection Inject 0.3 mg into the muscle as needed for anaphylaxis. 12/16/20   Duanne Butler DASEN, MD  fluconazole  (DIFLUCAN ) 150 MG tablet Take 1 tablet today and repeat in 3 days Patient not taking: Reported on 01/19/2024 06/08/23   Jayne Vonn DEL, MD  fluticasone  (FLONASE ) 50 MCG/ACT nasal spray Place 2 sprays into both nostrils daily. Patient not taking: Reported on 03/06/2023 12/09/21   Duanne Butler DASEN, MD  meloxicam  (MOBIC ) 15 MG tablet TAKE ONE TABLET BY MOUTH DAILY 02/22/24   Duanne Butler DASEN, MD  triamcinolone  cream (KENALOG ) 0.1 % Apply 1 Application topically 2 (two) times daily. 11/20/23   Vivienne Delon HERO, PA-C    Family History Family History  Problem Relation Age of Onset   Breast cancer Mother 16   Fibromyalgia Mother    Von Willebrand disease Mother    Stroke Mother    Hyperlipidemia Mother    Hypertension Mother    Breast cancer Maternal Aunt    Breast cancer Maternal Grandmother    Heart disease Maternal Grandmother    Anuerysm Maternal Grandmother    Heart failure Maternal Grandmother    Hypertension Maternal Grandfather    Stroke Maternal Grandfather    Heart disease Paternal Grandmother    Heart disease Paternal Grandfather    Cancer Father        Myloma and Mylodysplasia - [place]     Asthma Son     Social History Social History   Tobacco Use   Smoking status: Every Day    Types: E-cigarettes   Smokeless tobacco: Never  Vaping Use   Vaping status: Every Day   Substances: Nicotine, Flavoring  Substance Use Topics    Alcohol use: Yes    Comment: social drink   Drug use: No     Allergies   Prednisone   Review of Systems Review of Systems Per HPI  Physical Exam Triage Vital Signs ED Triage Vitals  Encounter Vitals Group     BP 03/15/24 1228 107/68     Girls Systolic BP Percentile --      Girls Diastolic BP Percentile --      Boys Systolic BP Percentile --      Boys  Diastolic BP Percentile --      Pulse Rate 03/15/24 1228 75     Resp 03/15/24 1228 20     Temp 03/15/24 1228 98.2 F (36.8 C)     Temp Source 03/15/24 1228 Oral     SpO2 03/15/24 1228 97 %     Weight --      Height --      Head Circumference --      Peak Flow --      Pain Score 03/15/24 1227 5     Pain Loc --      Pain Education --      Exclude from Growth Chart --    No data found.  Updated Vital Signs BP 107/68 (BP Location: Right Arm)   Pulse 75   Temp 98.2 F (36.8 C) (Oral)   Resp 20   LMP 08/03/2022 (Approximate) Comment: negative urine preg.01/02/23  SpO2 97%   Visual Acuity Right Eye Distance:   Left Eye Distance:   Bilateral Distance:    Right Eye Near:   Left Eye Near:    Bilateral Near:     Physical Exam Vitals and nursing note reviewed.  Constitutional:      General: She is not in acute distress.    Appearance: Normal appearance.  HENT:     Head: Normocephalic.  Eyes:     Extraocular Movements: Extraocular movements intact.  Pulmonary:     Effort: Pulmonary effort is normal.  Musculoskeletal:     Right wrist: Swelling and tenderness present. No deformity, snuff box tenderness or crepitus. Decreased range of motion. Normal pulse.     Cervical back: Normal range of motion.  Skin:    General: Skin is warm and dry.  Neurological:     General: No focal deficit present.     Mental Status: She is alert and oriented to person, place, and time.  Psychiatric:        Mood and Affect: Mood normal.        Behavior: Behavior normal.      UC Treatments / Results  Labs (all labs ordered  are listed, but only abnormal results are displayed) Labs Reviewed - No data to display  EKG   Radiology DG Wrist Complete Right Result Date: 03/15/2024 EXAM: 3 OR MORE VIEW(S) XRAY OF THE RIGHT WRIST 03/15/2024 12:39:21 PM COMPARISON: None available. CLINICAL HISTORY: fell hitting a golf cart. Pt reports she fell while walking backwards and injured her right wrist x 2 days. States the pain radiates up her right arm. More painful with picking things up FINDINGS: BONES AND JOINTS: No acute fracture. No focal osseous lesion. No joint dislocation. SOFT TISSUES: The soft tissues are unremarkable. IMPRESSION: 1. No acute fracture or dislocation. Electronically signed by: Waddell Calk MD 03/15/2024 01:18 PM EDT RP Workstation: [id number]    Procedures Procedures (including critical care time)  Medications Ordered in UC Medications - No data to display  Initial Impression / Assessment and Plan / UC Course  I have reviewed the triage vital signs and the nursing notes.  Pertinent labs & imaging results that were available during my care of the patient were reviewed by me and considered in my medical decision making (see chart for details).  X-ray of the right wrist is negative for fracture or dislocation.  Given the mechanism of injury, symptoms are consistent with a contusion.  Ace wrap was provided to allow for additional compression and support.  Supportive care recommendations were provided and discussed  with the patient to include RICE therapy and over-the-counter analgesics.  Discussed indications with patient regarding follow-up.  Patient was in agreement with this plan of care and verbalizes understanding.  All questions were answered.  Patient stable for discharge.   Final Clinical Impressions(s) / UC Diagnoses   Final diagnoses:  None   Discharge Instructions   None    ED Prescriptions   None    PDMP not reviewed this encounter.   Gilmer Etta PARAS, NP 03/15/24  1326

## 2024-03-15 NOTE — ED Triage Notes (Signed)
 Pt reports she fell while walking backwards and injured her right wrist x 2 days. States the pain radiates up her right arm. More painful with picking things up

## 2024-03-15 NOTE — Discharge Instructions (Addendum)
 The x-ray was negative for fracture or dislocation.  Based on the mechanism of injury, your symptoms are consistent with a contusion of the bone. You may take over-the-counter Tylenol  or ibuprofen  as needed for pain or discomfort. RICE therapy, rest, ice, compression, and elevation.  Apply ice for 20 minutes, remove for 1 hour, repeat is much as possible to help with pain or swelling. Gentle range of motion exercises of the right wrist to help decrease your recovery time. If your symptoms fail to improve over the next 2 to 4 weeks, or begin to worsen, recommend follow-up with orthopedics for further evaluation. Follow-up as needed.

## 2024-04-01 ENCOUNTER — Ambulatory Visit: Admitting: Orthopedic Surgery

## 2024-05-07 ENCOUNTER — Ambulatory Visit
Admission: RE | Admit: 2024-05-07 | Discharge: 2024-05-07 | Disposition: A | Discharge status: Home / Self Care | Source: Ambulatory Visit | Attending: Nurse Practitioner

## 2024-05-07 VITALS — BP 121/77 | HR 64 | Temp 98.5°F | Resp 18

## 2024-05-07 DIAGNOSIS — J208 Acute bronchitis due to other specified organisms: Secondary | ICD-10-CM

## 2024-05-07 DIAGNOSIS — Z8709 Personal history of other diseases of the respiratory system: Secondary | ICD-10-CM

## 2024-05-07 LAB — POC COVID19/FLU A&B COMBO
Covid Antigen, POC: NEGATIVE
Influenza A Antigen, POC: NEGATIVE
Influenza B Antigen, POC: NEGATIVE

## 2024-05-07 MED ORDER — ALBUTEROL SULFATE HFA 108 (90 BASE) MCG/ACT IN AERS: 2.0000 | INHALATION_SPRAY | Freq: Four times a day (QID) | RESPIRATORY_TRACT | 0 refills | Status: AC | PRN

## 2024-05-07 MED ORDER — PROMETHAZINE-DM 6.25-15 MG/5ML PO SYRP: 5.0000 mL | ORAL_SOLUTION | Freq: Four times a day (QID) | ORAL | 0 refills | Status: AC | PRN

## 2024-05-07 MED ORDER — BUDESONIDE-FORMOTEROL FUMARATE 80-4.5 MCG/ACT IN AERO: 2.0000 | INHALATION_SPRAY | Freq: Two times a day (BID) | RESPIRATORY_TRACT | 1 refills | Status: AC

## 2024-05-07 NOTE — Discharge Instructions (Signed)
 Take medication as prescribed. Increase fluids and allow for plenty of rest. You may take over-the-counter Tylenol  as needed for pain, fever, or general discomfort. Recommend use of a humidifier in your bedroom at nighttime during sleep and sleeping elevated on pillows while symptoms persist. For your right ear pain, apply warm compresses as needed. Recommend discontinuing smoking until symptoms improve. If symptoms fail to improve, or symptoms begin to worsen, you may follow-up in this clinic or with your primary care physician for further evaluation. Follow-up as needed.

## 2024-05-07 NOTE — ED Provider Notes (Signed)
 [facility]    CSN: [id number] Arrival date & time: 05/07/24  1235      History   Chief Complaint Chief Complaint  Patient presents with   Cough    Sore throat when coughing - Entered by patient    HPI Debbie Young is a 53 y.o. female.   The history is provided by the patient.   Patient presents for a 2-day history of cough, wheezing, headache, and burning in the chest.  Patient denies fever, chills, difficulty breathing, abdominal pain, nausea, vomiting, diarrhea, or rash.  Patient reports underlying history of asthma, denies any recent exacerbations.  States that she has been taking Tylenol  for her symptoms.  The patient is a current smoker.  Past Medical History:  Diagnosis Date   Anxiety    Asthma    history of asthma as child   Cellulitis 04/2001   LEFT FOOT   Diverticulitis    Hematuria    BENIGN--SAW UROLOGIST   History of abnormal cervical Pap smear 04/30/2015   LGSIL (low grade squamous intraepithelial dysplasia) 1997   Miscarriage 08/02/2013   Patient desires pregnancy 11/18/2013   Seizure disorder (HCC)    AS A CHILD--NO MEDS SINCE 16 YO   Smoker    Tobacco use    Vaginal Pap smear, abnormal     Patient Active Problem List   Diagnosis Date Noted   Pelvic pain 08/04/2022   Menometrorrhagia 07/25/2022   Anemia 07/25/2022   Tired 07/25/2022   Encounter for screening fecal occult blood testing 10/27/2021   Encounter for well woman exam with routine gynecological exam 10/27/2021   Peri-menopausal 10/15/2020   Encounter for gynecological examination with Papanicolaou smear of cervix 10/15/2020   Tobacco use    S/P left knee arthroscopy 04/26/18 05/03/2018   Family history of breast cancer 08/04/2017   Diverticulitis of large intestine with perforation without bleeding    Diverticulitis 07/28/2017   Status post tubal ligation 03/09/2017   History of cesarean delivery 08/24/2016   History of preterm delivery 08/24/2016   History of abnormal  cervical Pap smear 04/30/2015   Miscarriage 08/02/2013   LGSIL (low grade squamous intraepithelial dysplasia)    Cellulitis    Seizure disorder (HCC)    Hematuria    Smoker     Past Surgical History:  Procedure Laterality Date   BUNIONECTOMY Bilateral 1988, 1990   CERVICAL BIOPSY  W/ LOOP ELECTRODE EXCISION  07/22/2002   CESAREAN SECTION     CESAREAN SECTION WITH BILATERAL TUBAL LIGATION Bilateral 03/09/2017   Procedure: REPEAT CESAREAN SECTION WITH BILATERAL TUBAL LIGATION;  Surgeon: Edsel Norleen GAILS, MD;  Location: [facility];  Service: Obstetrics;  Laterality: Bilateral;   CHOLECYSTECTOMY     COLPOSCOPY     KNEE ARTHROSCOPY Right 04/26/2018   Procedure: ARTHROSCOPY KNEE with removal of ganglion of Anterior Cruciate Ligament;  Surgeon: Margrette Taft BRAVO, MD;  Location: [facility];  Service: Orthopedics;  Laterality: Right;   REFRACTIVE SURGERY  05/23/1998   ROBOTIC ASSISTED TOTAL HYSTERECTOMY WITH BILATERAL SALPINGO OOPHERECTOMY Bilateral 01/11/2023   Procedure: XI ROBOTIC ASSISTED TOTAL HYSTERECTOMY WITH BILATERAL SALPINGO OOPHORECTOMY;  Surgeon: Jayne Vonn DEL, MD;  Location: [facility];  Service: Gynecology;  Laterality: Bilateral;   TONSILLECTOMY AND ADENOIDECTOMY      OB History     Gravida  4   Para  2   Term  1   Preterm  1   AB  2   Living  2  SAB  2   IAB      Ectopic      Multiple  0   Live Births  2            Home Medications    Prior to Admission medications  Medication Sig Start Date End Date Taking? Authorizing Provider  albuterol  (VENTOLIN  HFA) 108 (90 Base) MCG/ACT inhaler Inhale 2 puffs into the lungs every 6 (six) hours as needed for wheezing or shortness of breath. 06/19/23   Chandra Harlene LABOR, NP  amoxicillin -clavulanate (AUGMENTIN ) 875-125 MG tablet Take 1 tablet by mouth 2 (two) times daily. 01/19/24   Duanne Butler DASEN, MD  budesonide -formoterol  (SYMBICORT ) 80-4.5 MCG/ACT inhaler Inhale 2 puffs into the lungs in the  morning and at bedtime. Patient not taking: Reported on 01/19/2023 09/11/22   Raspet, Rocky POUR, PA-C  EPINEPHrine  0.3 mg/0.3 mL IJ SOAJ injection Inject 0.3 mg into the muscle as needed for anaphylaxis. 12/16/20   Duanne Butler DASEN, MD  fluconazole  (DIFLUCAN ) 150 MG tablet Take 1 tablet today and repeat in 3 days Patient not taking: Reported on 01/19/2024 06/08/23   Jayne Vonn DEL, MD  fluticasone  (FLONASE ) 50 MCG/ACT nasal spray Place 2 sprays into both nostrils daily. Patient not taking: Reported on 03/06/2023 12/09/21   Duanne Butler DASEN, MD  meloxicam  (MOBIC ) 15 MG tablet TAKE ONE TABLET BY MOUTH DAILY 02/22/24   Duanne Butler DASEN, MD  triamcinolone  cream (KENALOG ) 0.1 % Apply 1 Application topically 2 (two) times daily. 11/20/23   Vivienne Delon HERO, PA-C    Family History Family History  Problem Relation Age of Onset   Breast cancer Mother 22   Fibromyalgia Mother    Von Willebrand disease Mother    Stroke Mother    Hyperlipidemia Mother    Hypertension Mother    Breast cancer Maternal Aunt    Breast cancer Maternal Grandmother    Heart disease Maternal Grandmother    Anuerysm Maternal Grandmother    Heart failure Maternal Grandmother    Hypertension Maternal Grandfather    Stroke Maternal Grandfather    Heart disease Paternal Grandmother    Heart disease Paternal Grandfather    Cancer Father        Myloma and Mylodysplasia - [place]     Asthma Son     Social History Social History[1]   Allergies   Prednisone   Review of Systems Review of Systems Per HPI  Physical Exam Triage Vital Signs ED Triage Vitals  Encounter Vitals Group     BP 05/07/24 1311 121/77     Girls Systolic BP Percentile --      Girls Diastolic BP Percentile --      Boys Systolic BP Percentile --      Boys Diastolic BP Percentile --      Pulse Rate 05/07/24 1311 64     Resp 05/07/24 1311 18     Temp 05/07/24 1311 98.5 F (36.9 C)     Temp Source 05/07/24 1311 Oral     SpO2 05/07/24  1311 97 %     Weight --      Height --      Head Circumference --      Peak Flow --      Pain Score 05/07/24 1310 4     Pain Loc --      Pain Education --      Exclude from Growth Chart --    No data found.  Updated Vital Signs BP  121/77 (BP Location: Right Arm)   Pulse 64   Temp 98.5 F (36.9 C) (Oral)   Resp 18   LMP 08/03/2022 Comment: negative urine preg.01/02/23  SpO2 97%   Visual Acuity Right Eye Distance:   Left Eye Distance:   Bilateral Distance:    Right Eye Near:   Left Eye Near:    Bilateral Near:     Physical Exam Vitals and nursing note reviewed.  Constitutional:      General: She is not in acute distress.    Appearance: Normal appearance.  HENT:     Head: Normocephalic.     Right Ear: Tympanic membrane, ear canal and external ear normal.     Left Ear: Tympanic membrane, ear canal and external ear normal.     Nose: Nose normal.     Right Turbinates: Enlarged and swollen.     Left Turbinates: Enlarged and swollen.     Right Sinus: No maxillary sinus tenderness or frontal sinus tenderness.     Left Sinus: No maxillary sinus tenderness or frontal sinus tenderness.     Mouth/Throat:     Lips: Pink.     Mouth: Mucous membranes are moist.     Pharynx: Postnasal drip present. No pharyngeal swelling, oropharyngeal exudate, posterior oropharyngeal erythema or uvula swelling.  Eyes:     Extraocular Movements: Extraocular movements intact.     Pupils: Pupils are equal, round, and reactive to light.  Cardiovascular:     Rate and Rhythm: Normal rate and regular rhythm.     Pulses: Normal pulses.     Heart sounds: Normal heart sounds.  Pulmonary:     Effort: Pulmonary effort is normal. No respiratory distress.     Breath sounds: Normal breath sounds. No stridor. No wheezing, rhonchi or rales.  Abdominal:     General: Bowel sounds are normal.     Palpations: Abdomen is soft.  Musculoskeletal:     Cervical back: Normal range of motion.  Skin:    General:  Skin is warm and dry.  Neurological:     General: No focal deficit present.     Mental Status: She is alert and oriented to person, place, and time.  Psychiatric:        Mood and Affect: Mood normal.        Behavior: Behavior normal.      UC Treatments / Results  Labs (all labs ordered are listed, but only abnormal results are displayed) Labs Reviewed  POC COVID19/FLU A&B COMBO - Normal    EKG   Radiology No results found.  Procedures Procedures (including critical care time)  Medications Ordered in UC Medications - No data to display  Initial Impression / Assessment and Plan / UC Course  I have reviewed the triage vital signs and the nursing notes.  Pertinent labs & imaging results that were available during my care of the patient were reviewed by me and considered in my medical decision making (see chart for details).  COVID/flu test was negative.  On exam, the patient's lung sounds are clear throughout, room air sats are at 97%.  Patient complains of burning in the chest.  Concern for viral bronchitis.  Will treat with Symbicort  inhaler and an albuterol  inhaler.  Symptomatic treatment for the cough provided with Promethazine  DM.  Supportive care recommendations were provided and discussed with the patient to include fluids, rest, over-the-counter analgesics, smoking cessation and use of a humidifier during sleep.  Discussed indications with the patient regarding follow-up.  Patient was in agreement with this plan of care and verbalizes understanding.  All questions were answered.  Patient stable for discharge.   Final Clinical Impressions(s) / UC Diagnoses   Final diagnoses:  None   Discharge Instructions   None    ED Prescriptions   None    PDMP not reviewed this encounter.     [1]  Social History Tobacco Use   Smoking status: Every Day    Types: E-cigarettes   Smokeless tobacco: Never  Vaping Use   Vaping status: Every Day   Substances: Nicotine,  Flavoring  Substance Use Topics   Alcohol use: Yes    Comment: social drink   Drug use: No     Gilmer Etta PARAS, NP 05/07/24 1359

## 2024-05-07 NOTE — ED Triage Notes (Signed)
 Pt reports she has a cough, headache  and chest burning x 2 days    Took tylenol 

## 2024-05-20 ENCOUNTER — Ambulatory Visit: Admitting: Family Medicine
